# Patient Record
Sex: Male | Born: 1940 | Race: White | Hispanic: No | Marital: Married | Smoking: Current every day smoker
Health system: Southern US, Community
[De-identification: ages and names within clinical notes are randomized; demographics above are authoritative.]

## PROBLEM LIST (undated history)

## (undated) DIAGNOSIS — I2510 Atherosclerotic heart disease of native coronary artery without angina pectoris: Secondary | ICD-10-CM

## (undated) DIAGNOSIS — E119 Type 2 diabetes mellitus without complications: Secondary | ICD-10-CM

## (undated) DIAGNOSIS — I10 Essential (primary) hypertension: Secondary | ICD-10-CM

## (undated) DIAGNOSIS — I219 Acute myocardial infarction, unspecified: Secondary | ICD-10-CM

## (undated) HISTORY — PX: CARDIAC SURGERY: SHX584

## (undated) HISTORY — PX: TONSILLECTOMY: SUR1361

---

## 2017-05-15 ENCOUNTER — Ambulatory Visit (INDEPENDENT_AMBULATORY_CARE_PROVIDER_SITE_OTHER): Payer: Medicare HMO

## 2017-05-15 ENCOUNTER — Other Ambulatory Visit: Payer: Self-pay

## 2017-05-15 ENCOUNTER — Ambulatory Visit
Admission: EM | Admit: 2017-05-15 | Discharge: 2017-05-15 | Disposition: A | Discharge status: Home / Self Care | Payer: Medicare HMO | Attending: Family Medicine | Admitting: Family Medicine

## 2017-05-15 ENCOUNTER — Encounter: Payer: Self-pay | Admitting: Emergency Medicine

## 2017-05-15 DIAGNOSIS — J069 Acute upper respiratory infection, unspecified: Secondary | ICD-10-CM | POA: Diagnosis not present

## 2017-05-15 DIAGNOSIS — B028 Zoster with other complications: Secondary | ICD-10-CM

## 2017-05-15 DIAGNOSIS — R0781 Pleurodynia: Secondary | ICD-10-CM

## 2017-05-15 HISTORY — DX: Type 2 diabetes mellitus without complications: E11.9

## 2017-05-15 HISTORY — DX: Acute myocardial infarction, unspecified: I21.9

## 2017-05-15 HISTORY — DX: Essential (primary) hypertension: I10

## 2017-05-15 HISTORY — DX: Atherosclerotic heart disease of native coronary artery without angina pectoris: I25.10

## 2017-05-15 MED ORDER — TRAMADOL HCL 50 MG PO TABS: 50.0000 mg | ORAL_TABLET | Freq: Two times a day (BID) | ORAL | 0 refills | Status: DC | PRN

## 2017-05-15 MED ORDER — VALACYCLOVIR HCL 1 G PO TABS: 1000.0000 mg | ORAL_TABLET | Freq: Three times a day (TID) | ORAL | 0 refills | Status: AC

## 2017-05-15 NOTE — ED Provider Notes (Signed)
[facility] ____________________________________________  Time seen: Approximately 6:58 PM  I have reviewed the triage vital signs and the nursing notes.   HISTORY  Chief Complaint Cough and Nasal Congestion  HPI Peter Moore is a 77 y.o. male presenting for evaluation of right-sided rib pain that has been present up to 2-3 weeks.  States that he had right-sided rib pain for approximately 3 weeks, however approximately 6 days ago the pain improved slightly in a rash appeared in the same area.  Patient reports he does not like taking anything for pain, but reports he had some leftover 600 mg ibuprofens in which she has taken 2 yesterday and 2 today over the whole day, and states that it has helped some.  Denies taking any other over-the-counter medications for the same complaints.  Denies fall or direct trauma.  States right side rib pain does hurt worse with coughing or sneezing.  States that 2 days ago he was having a "coughing fit" and states he had some increased pain to his right ribs while coughing.  States for the last 3 days he has also had a runny nose, nasal congestion and cough.  States his wife sick with some similar the complaints.  Denies known fevers.  Denies sore throat.  States that he feels he has "just a cold ".  Patient states that he is here for the right-sided rib pain.  Patient reports that he did have chickenpox as a child.  Also reports that he did have the shingles vaccine.  Denies any other changes of contacts or known triggers.  Reports otherwise feels well.  Denies any accompanying chest pain, shortness of breath or hemoptysis.  Denies chest pain, shortness of breath, abdominal pain, extremity pain, extremity swelling or rash. Denies recent sickness. Denies recent antibiotic use.   [place]: PCP   Past Medical History:  Diagnosis Date  . Coronary artery disease   . Diabetes mellitus without complication (HCC)   . Hypertension   .  Myocardial infarction (HCC)     There are no active problems to display for this patient.   Past Surgical History:  Procedure Laterality Date  . CARDIAC SURGERY    . TONSILLECTOMY       No current facility-administered medications for this encounter.   Current Outpatient Medications:  .  aspirin EC 81 MG tablet, Take 81 mg by mouth daily., Disp: , Rfl:  .  losartan (COZAAR) 100 MG tablet, Take 100 mg by mouth daily., Disp: , Rfl:  .  metFORMIN (GLUCOPHAGE) 1000 MG tablet, Take 1,000 mg by mouth 2 (two) times daily with a meal., Disp: , Rfl:  .  traMADol (ULTRAM) 50 MG tablet, Take 1 tablet (50 mg total) by mouth every 12 (twelve) hours as needed for moderate pain (Do not drive or operate machinery while taking as can cause drowsiness.)., Disp: 10 tablet, Rfl: 0 .  valACYclovir (VALTREX) 1000 MG tablet, Take 1 tablet (1,000 mg total) by mouth 3 (three) times daily for 7 days., Disp: 21 tablet, Rfl: 0  Allergies Patient has no known allergies.  History reviewed. No pertinent family history.  Social History Social History   Tobacco Use  . Smoking status: Current Every Day Smoker    Types: Cigarettes  . Smokeless tobacco: Never Used  Substance Use Topics  . Alcohol use: No    Frequency: Never  . Drug use: Not on file    Review of Systems Constitutional: No fever/chills ENT: No sore throat.  Cardiovascular: Denies chest pain. Respiratory: Denies shortness of breath Musculoskeletal: Negative for back pain. Skin: As above. Neurological: Negative for headaches, focal weakness or numbness.   ____________________________________________   PHYSICAL EXAM:  VITAL SIGNS: ED Triage Vitals  Enc Vitals Group     BP 05/15/17 1807 (!) 159/81     Pulse Rate 05/15/17 1807 77     Resp 05/15/17 1807 19     Temp 05/15/17 1807 98.2 F (36.8 C)     Temp Source 05/15/17 1807 Oral     SpO2 05/15/17 1807 95 %     Weight 05/15/17 1803 215 lb (97.5 kg)     Height 05/15/17 1803 5\' 5"   (1.651 m)     Head Circumference --      Peak Flow --      Pain Score 05/15/17 1803 8     Pain Loc --      Pain Edu? --      Excl. in GC? --     Constitutional: Alert and oriented. Well appearing and in no acute distress. Eyes: Conjunctivae are normal.  Head: Atraumatic. No sinus tenderness to palpation. No swelling. No erythema.  Ears: no erythema, normal TMs bilaterally.   Nose:Nasal congestion  Mouth/Throat: Mucous membranes are moist. No pharyngeal erythema. No tonsillar swelling or exudate.  Neck: No stridor.  No cervical spine tenderness to palpation. Hematological/Lymphatic/Immunilogical: No cervical lymphadenopathy. Cardiovascular: Normal rate, regular rhythm. Grossly normal heart sounds.  Good peripheral circulation. Respiratory: Normal respiratory effort.  No retractions. No wheezes, rales or rhonchi. Good air movement.  Gastrointestinal: Soft and nontender. Obese abdomen. Musculoskeletal: Ambulatory with steady gait. No cervical, thoracic or lumbar tenderness to palpation. Neurologic:  Normal speech and language. No gait instability. Skin:  Skin appears warm, dry. Except: Patient with right anterior axillary to mid clavicular line beneath breast erythematous clustered vesicular rash that is mildly tender to direct palpation, no surrounding direct rib tenderness, no drainage, no other rash noted, no other tenderness noted. Psychiatric: Mood and affect are normal. Speech and behavior are normal.  ___________________________________________   LABS (all labs ordered are listed, but only abnormal results are displayed)  Labs Reviewed - No data to display  RADIOLOGY  Dg Ribs Unilateral W/chest Right  Result Date: 05/15/2017 CLINICAL DATA:  RIGHT anterolateral rib pain for 3-4 weeks, rash, question shingles, has been traveling, cough for 3 days, history hypertension, diabetes mellitus, coronary artery disease post MI EXAM: RIGHT RIBS AND CHEST - 3+ VIEW COMPARISON:  None  FINDINGS: Normal heart size and pulmonary vascularity. Tortuous and calcified thoracic aorta. Lungs clear. No infiltrate, pleural effusion or pneumothorax. BB placed at site of symptoms lower RIGHT chest. Bones demineralized. No definite rib fracture or bone destruction. IMPRESSION: No acute abnormalities. Electronically Signed   By: Ulyses Southward M.D.   On: 05/15/2017 19:25   ____________________________________________   PROCEDURES Procedures    INITIAL IMPRESSION / ASSESSMENT AND PLAN / ED COURSE  Pertinent labs & imaging results that were available during my care of the patient were reviewed by me and considered in my medical decision making (see chart for details).  Well-appearing patient.  No acute distress.  Presenting for evaluation of right rib pain as well as cough and congestion.  Right rib pain present prior to rash onset, rash clinical appearance consistent with shingles.  Will treat with Valtrex.  Patient declines need for pain medication will also evaluate rib x-ray.  Rib x-rays reviewed, and as per radiologist, no acute abnormalities.  Discussed  with patient.  Will start patient on oral Valtrex.  Patient initially declined prescription pain medication, but then requested to have prescription just in case if needed.  Rx tramadol as needed for breakthrough pain.  Patient also with viral upper respiratory infection, patient states he does not need medications for this, encourage supportive care.  Discussed strict follow-up and return parameters.  Follow-up with primary care as needed for continued pain or rash concerns.Discussed indication, risks and benefits of medications with patient.  Discussed follow up with Primary care physician this week. Discussed follow up and return parameters including no resolution or any worsening concerns. Patient verbalized understanding and agreed to plan.     [place] controlled substance database reviewed for last one year, and no recent  controlled medications found.  ____________________________________________   FINAL CLINICAL IMPRESSION(S) / ED DIAGNOSES  Final diagnoses:  Rib pain on right side  Herpes zoster with complication  Upper respiratory tract infection, unspecified type     ED Discharge Orders        Ordered    valACYclovir (VALTREX) 1000 MG tablet  3 times daily     05/15/17 1933    traMADol (ULTRAM) 50 MG tablet  Every 12 hours PRN     05/15/17 1939       Note: This dictation was prepared with Dragon dictation along with smaller phrase technology. Any transcriptional errors that result from this process are unintentional.         Renford DillsMiller, Tionna Gigante, NP 05/15/17 1953

## 2017-05-15 NOTE — ED Triage Notes (Signed)
Patient reports cough, congestion, and runny nose since Dec. 26  Patient reports right sided rib pain that has gotten worse since having his cold.  Patient denies fevers.  Patient denies chest pain and SOB.

## 2017-05-15 NOTE — Discharge Instructions (Signed)
Take medication as prescribed. Rest. Drink plenty of fluids. Monitor.  ° °Follow up with your primary care physician this week. Return to Urgent care for new or worsening concerns.  ° °

## 2018-12-02 ENCOUNTER — Other Ambulatory Visit: Payer: Self-pay | Admitting: Neurology

## 2018-12-02 DIAGNOSIS — I63412 Cerebral infarction due to embolism of left middle cerebral artery: Secondary | ICD-10-CM

## 2018-12-13 ENCOUNTER — Ambulatory Visit
Admission: RE | Admit: 2018-12-13 | Discharge: 2018-12-13 | Disposition: A | Discharge status: Home / Self Care | Payer: Medicare HMO | Source: Ambulatory Visit | Attending: Neurology | Admitting: Neurology

## 2018-12-13 ENCOUNTER — Other Ambulatory Visit: Payer: Self-pay

## 2018-12-13 DIAGNOSIS — I63412 Cerebral infarction due to embolism of left middle cerebral artery: Secondary | ICD-10-CM

## 2019-01-15 ENCOUNTER — Other Ambulatory Visit: Payer: Self-pay | Admitting: Neurology

## 2019-01-15 DIAGNOSIS — I639 Cerebral infarction, unspecified: Secondary | ICD-10-CM

## 2019-01-15 DIAGNOSIS — R4701 Aphasia: Secondary | ICD-10-CM

## 2019-01-23 ENCOUNTER — Ambulatory Visit
Admission: RE | Admit: 2019-01-23 | Discharge: 2019-01-23 | Disposition: A | Discharge status: Home / Self Care | Payer: Medicare HMO | Source: Ambulatory Visit | Attending: Neurology | Admitting: Neurology

## 2019-01-23 ENCOUNTER — Other Ambulatory Visit: Payer: Self-pay

## 2019-01-23 DIAGNOSIS — R4701 Aphasia: Secondary | ICD-10-CM | POA: Insufficient documentation

## 2019-01-23 DIAGNOSIS — I639 Cerebral infarction, unspecified: Secondary | ICD-10-CM | POA: Diagnosis not present

## 2019-03-19 ENCOUNTER — Other Ambulatory Visit: Payer: Self-pay

## 2019-03-19 ENCOUNTER — Encounter: Payer: Self-pay | Admitting: Speech Pathology

## 2019-03-19 ENCOUNTER — Ambulatory Visit: Payer: Medicare HMO | Attending: Neurology | Admitting: Speech Pathology

## 2019-03-19 DIAGNOSIS — R4701 Aphasia: Secondary | ICD-10-CM | POA: Insufficient documentation

## 2019-03-19 NOTE — Therapy (Signed)
[place] MAIN [facility] SERVICES [address] [place], [place], [address] Phone: [phone]   Fax:  [phone]  Speech Language Pathology Evaluation  Patient Details  Name: Peter Moore MRN: [id number] Date of Birth: [birth year] Referring Provider (SLP): Dr. Manuella Ghazi   Encounter Date: 03/19/2019  End of Session - 03/19/19 1531    Visit Number  1    Number of Visits  25    Date for SLP Re-Evaluation  06/11/19    Authorization Type  Medicare    Authorization Time Period  start 03/19/2019    Authorization - Visit Number  1    Authorization - Number of Visits  10    SLP Start Time  1300    SLP Stop Time   1400    SLP Time Calculation (min)  60 min    Activity Tolerance  Patient tolerated treatment well       Past Medical History:  Diagnosis Date  . Coronary artery disease   . Diabetes mellitus without complication ([place])   . Hypertension   . Myocardial infarction [facility])     Past Surgical History:  Procedure Laterality Date  . CARDIAC SURGERY    . TONSILLECTOMY      There were no vitals filed for this visit.      SLP Evaluation [facility] - 03/19/19 0001      SLP Visit Information   SLP Received On  03/19/19    Referring Provider (SLP)  Dr. 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  He received inpatient rehab at [facility], several outpatient sessions at [facility], and home health therapy (not clear when this ended).      Prior Functional Status   Cognitive/Linguistic Baseline  Within functional limits      Auditory Comprehension   Overall Auditory Comprehension  Impaired      Reading Comprehension   Reading Status  Impaired      Expression   Primary Mode of Expression  Verbal      Verbal  Expression   Overall Verbal Expression  Impaired      Oral Motor/Sensory Function   Overall Oral Motor/Sensory Function  Appears within functional limits for tasks assessed      Motor Speech   Overall Motor Speech  Appears within functional limits for tasks assessed      Standardized Assessments   Standardized Assessments   Western Aphasia Battery revised         Western Aphasia Battery- Revised  Spontaneous Speech      Information content  5/10       Fluency   6/10     Comprehension     Yes/No questions  54/60        Auditory Word Recognition 44/60        Sequential Commands 16/80    Repetition   76/100     Naming    Object Naming  48/60        Word Fluency   3/20        Sentence Completion 8/10        Responsive Speech   7/10  Aphasia Quotient  61.8/100    SLP Education - 03/19/19 1530    Education Details  Results and recommendations    Person(s) Educated  Patient;Spouse    Methods  Explanation    Comprehension  Verbalized understanding         SLP Long Term Goals - 03/19/19 1535      SLP LONG TERM GOAL #1   Title  Patient will complete semantic feature word finding tasks with 80% accuracy.    Time  12    Period  Weeks    Status  New    Target Date  06/11/19      SLP LONG TERM GOAL #2   Title  Patient will generate grammatical and cogent sentence to complete simple/concrete linguistic task with 80% accuracy.    Time  12    Period  Weeks    Status  New    Target Date  06/11/19      SLP LONG TERM GOAL #3   Title  Patient will complete 2 unit processing tasks with 80% accuracy without the need of repetition of task instructions or significant delays in responding.    Time  12    Period  Weeks    Status  New    Target Date  06/11/19      SLP LONG TERM GOAL #4   Title  Patient will demonstrate reading comprehension for sentences with 80% accuracy.    Time  12    Period  Weeks    Status  New    Target Date  06/11/19       Plan - 03/19/19 1533     Clinical Impression Statement  At 7 months post onset left MCA CVA, the patient is presenting with moderate Wernicke's Aphasia characterized by relatively fluent speech with poor information content, impaired auditory comprehension, impaired repetition, and impaired naming.  The patient would benefit from skilled speech therapy for restorative and compensatory treatment of aphasia.    Speech Therapy Frequency  2x / week    Duration  Other (comment)   12 weeks   Treatment/Interventions  Multimodal communcation approach;Language facilitation;SLP instruction and feedback;Patient/family education    Potential to Achieve Goals  Good    Potential Considerations  Ability to learn/carryover information;Pain level;Family/community support;Co-morbidities;Previous level of function;Cooperation/participation level;Severity of impairments    Consulted and Agree with Plan of Care  Patient;Family member/caregiver       Patient will benefit from skilled therapeutic intervention in order to improve the following deficits and impairments:   Aphasia - Plan: SLP plan of care cert/re-cert    Problem List There are no active problems to display for this patient.   Dollene Primrose, MS/CCC- SLP  Peter Moore 03/19/2019, 3:41 PM  [place] [facility] MAIN [facility] SERVICES [address] [place], [place], [address] Phone: [phone]   Fax:  [phone]  Name: Peter Moore MRN: [id number] Date of Birth: [birth year]

## 2019-03-23 ENCOUNTER — Ambulatory Visit: Payer: Medicare HMO | Admitting: Speech Pathology

## 2019-03-23 ENCOUNTER — Other Ambulatory Visit: Payer: Self-pay

## 2019-03-23 ENCOUNTER — Encounter: Payer: Self-pay | Admitting: Speech Pathology

## 2019-03-23 DIAGNOSIS — R4701 Aphasia: Secondary | ICD-10-CM

## 2019-03-23 NOTE — Therapy (Signed)
[place] MAIN [facility] SERVICES [address] [place], [place], [address] Phone: [phone]   Fax:  [phone]  Speech Language Pathology Treatment  Patient Details  Name: Peter Moore MRN: [id number] Date of Birth: 04-Dec-1940 Referring Provider (SLP): Dr. Manuella Ghazi   Encounter Date: 03/23/2019  End of Session - 03/23/19 1249    Visit Number  2    Number of Visits  25    Date for SLP Re-Evaluation  06/11/19    Authorization Type  Medicare    Authorization Time Period  start 03/19/2019    Authorization - Visit Number  2    Authorization - Number of Visits  10    SLP Start Time  1000    SLP Stop Time   1050    SLP Time Calculation (min)  50 min    Activity Tolerance  Patient tolerated treatment well       Past Medical History:  Diagnosis Date  . Coronary artery disease   . Diabetes mellitus without complication ([place])   . Hypertension   . Myocardial infarction [facility])     Past Surgical History:  Procedure Laterality Date  . CARDIAC SURGERY    . TONSILLECTOMY      There were no vitals filed for this visit.  Subjective Assessment - 03/23/19 1247    Subjective  The patient was alert and cooperative throughout the therapy session. He shared information about his children and grandchildren.            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  He received inpatient rehab at [facility], several outpatient sessions at [facility], and home health therapy (not clear when this ended).       Treatment Provided   Treatment provided  Cognitive-Linquistic      Pain Assessment   Pain Assessment  No/denies pain      Cognitive-Linquistic Treatment   Treatment focused on  Aphasia    Skilled Treatment  Patient completed a word finding task with 20% accuracy independently. Given semantic/phonemic cueing, accuracy increased to  100%. Patient generated grammatical and cogent sentences to complete a simple/concrete linguistic task with 50% accuracy without interventions. Given moderate cueing to complete his thoughts and to repair breakdowns, accuracy improved to 65%. Patient answered personal 2 unit yes/no questions with 100% accuracy independently without needing repetition of task instructions or significant delays in responding. Patient followed 2 unit body part commands with 80% accuracy independently without needing repetition of task instructions or significant delays in responding. Given repetition of instructions and longer response time, accuracy improved to 95%. Patient followed 2 unit commands with pictures with 60% accuracy independently without needing repetition of task instructions or significant delays in responding. Given repetition of instructions, longer response time, and moderate verbal cueing, accuracy increased to 95%. Patient demonstrated reading comprehension for sentences with 60% accuracy without interventions. Given moderate cueing for attention to key information, accuracy increased to 85%. Patient noted with perseveration across all therapy tasks.      Assessment / Recommendations / Plan   Plan  Continue with current plan of care      Progression Toward Goals   Progression toward goals  Progressing toward goals       SLP Education - 03/23/19 1248    Education Details  When you get lost, take a pause,  come back to the task, and try again.    Person(s) Educated  Patient    Methods  Explanation    Comprehension  Verbalized understanding;Returned demonstration         SLP Long Term Goals - 03/19/19 1535      SLP LONG TERM GOAL #1   Title  Patient will complete semantic feature word finding tasks with 80% accuracy.    Time  12    Period  Weeks    Status  New    Target Date  06/11/19      SLP LONG TERM GOAL #2   Title  Patient will generate grammatical and cogent sentence to complete  simple/concrete linguistic task with 80% accuracy.    Time  12    Period  Weeks    Status  New    Target Date  06/11/19      SLP LONG TERM GOAL #3   Title  Patient will complete 2 unit processing tasks with 80% accuracy without the need of repetition of task instructions or significant delays in responding.    Time  12    Period  Weeks    Status  New    Target Date  06/11/19      SLP LONG TERM GOAL #4   Title  Patient will demonstrate reading comprehension for sentences with 80% accuracy.    Time  12    Period  Weeks    Status  New    Target Date  06/11/19       Plan - 03/23/19 1250    Clinical Impression Statement  Patient presents with moderate Wernicke's Aphasia characterized by relatively fluent speech with poor information content, impaired auditory comprehension, reduced repetition abilities, and impaired naming. He is frustrated by his communication challenges and gives up easily when he experiences difficulty expressing himself. Patient is stimulable for word finding given semantic/phonemic cueing. Patient is responsive to corrective feedback and verbal cueing to "pause, come back, and try again" during auditory comprehension tasks. Patient will continue with skilled speech therapy for restorative and compensatory treatment of aphasia.    Speech Therapy Frequency  2x / week    Duration  Other (comment)    Treatment/Interventions  Multimodal communcation approach;Language facilitation;SLP instruction and feedback;Patient/family education;Cueing hierarchy;Compensatory techniques    Potential to Achieve Goals  Good    Potential Considerations  Ability to learn/carryover information;Pain level;Family/community support;Co-morbidities;Previous level of function;Cooperation/participation level;Severity of impairments       Patient will benefit from skilled therapeutic intervention in order to improve the following deficits and impairments:   Aphasia    Problem List There are no  active problems to display for this patient.  Yacob Wilkerson A. Avelina Laine., Graduate Clinician Haskel Khan 03/23/2019, 12:51 PM  [place] [facility] MAIN [facility] SERVICES [address] [place], [place], [address] Phone: [phone]   Fax:  [phone]   Name: Eustace Hur MRN: [id number] Date of Birth: 05/22/1940

## 2019-03-25 ENCOUNTER — Other Ambulatory Visit: Payer: Self-pay

## 2019-03-25 ENCOUNTER — Encounter: Payer: Self-pay | Admitting: Speech Pathology

## 2019-03-25 ENCOUNTER — Ambulatory Visit: Payer: Medicare HMO | Admitting: Speech Pathology

## 2019-03-25 DIAGNOSIS — R4701 Aphasia: Secondary | ICD-10-CM | POA: Diagnosis not present

## 2019-03-25 NOTE — Therapy (Signed)
[place] [facility] MAIN [facility] SERVICES [address] [place], [place], [address] Phone: [phone]   Fax:  [phone]  Speech Language Pathology Treatment  Patient Details  Name: Peter Moore MRN: [id number] Date of Birth: Sep 22, 1940 Referring Provider (SLP): Dr. Sherryll Burger   Encounter Date: 03/25/2019  End of Session - 03/25/19 1306    Visit Number  3    Number of Visits  25    Date for SLP Re-Evaluation  06/11/19    Authorization Type  Medicare    Authorization Time Period  start 03/19/2019    Authorization - Visit Number  3    Authorization - Number of Visits  10    SLP Start Time  1000    SLP Stop Time   1050    SLP Time Calculation (min)  50 min    Activity Tolerance  Patient tolerated treatment well       Past Medical History:  Diagnosis Date  . Coronary artery disease   . Diabetes mellitus without complication (HCC)   . Hypertension   . Myocardial infarction [facility])     Past Surgical History:  Procedure Laterality Date  . CARDIAC SURGERY    . TONSILLECTOMY      There were no vitals filed for this visit.  Subjective Assessment - 03/25/19 1305    Subjective  The patient was alert and cooperative throughout the therapy session. He shared that [birth year] was [birth year] birthday.            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  He received inpatient rehab at [facility], several outpatient sessions at [facility], and home health therapy (not clear when this ended).       Treatment Provided   Treatment provided  Cognitive-Linquistic      Pain Assessment   Pain Assessment  No/denies pain      Cognitive-Linquistic Treatment   Treatment focused on  Aphasia    Skilled Treatment  Patient named at least 2 members of concrete categories with 20% accuracy independently. Given semantic/phonemic cueing, accuracy increased  to 100%. Patient generated grammatical and cogent sentences to complete a simple/concrete linguistic task with 65% accuracy without interventions. Given moderate cueing to complete his thoughts and to repair communication breakdowns, accuracy improved to 75%. Patient answered personal 2 unit yes/no questions with 100% accuracy independently without needing repetition of task instructions or significant delays in responding. Patient followed 2 unit body part commands with 70% accuracy independently without needing repetition of task instructions or significant delays in responding. Given repetition of instructions and longer response time, accuracy improved to 100%. Patient followed 2 unit commands with pictures with 70% accuracy independently without needing repetition of task instructions or significant delays in responding. Given repetition of instructions, longer response time, and moderate verbal cueing, accuracy increased to 100%. Patient named common objects from drawings with 80% accuracy independently. Given minimal semantic cueing, accuracy improved to 100%. Patient demonstrated reading comprehension for sentences with 75% accuracy without interventions. Given moderate cueing for attention to key information, accuracy increased to 100%. Patient noted with perseveration across all therapy tasks.      Assessment / Recommendations / Plan   Plan  Continue with current plan of care      Progression Toward Goals   Progression toward goals  Progressing toward goals  SLP Education - 03/25/19 1305    Education Details  Your anger and frustration are your greatest enemies when it comes to dealing with your language challenges.    Person(s) Educated  Patient    Methods  Explanation    Comprehension  Verbalized understanding         SLP Long Term Goals - 03/19/19 1535      SLP LONG TERM GOAL #1   Title  Patient will complete semantic feature word finding tasks with 80% accuracy.    Time  12     Period  Weeks    Status  New    Target Date  06/11/19      SLP LONG TERM GOAL #2   Title  Patient will generate grammatical and cogent sentence to complete simple/concrete linguistic task with 80% accuracy.    Time  12    Period  Weeks    Status  New    Target Date  06/11/19      SLP LONG TERM GOAL #3   Title  Patient will complete 2 unit processing tasks with 80% accuracy without the need of repetition of task instructions or significant delays in responding.    Time  12    Period  Weeks    Status  New    Target Date  06/11/19      SLP LONG TERM GOAL #4   Title  Patient will demonstrate reading comprehension for sentences with 80% accuracy.    Time  12    Period  Weeks    Status  New    Target Date  06/11/19       Plan - 03/25/19 1307    Clinical Impression Statement  Patient presents with moderate Wernicke's Aphasia characterized by relatively fluent speech with poor information content, impaired auditory comprehension, reduced repetition abilities, and impaired naming. He is frustrated and angry about his communication challenges and gives up easily when he experiences difficulty expressing himself. Patient is stimulable for word finding given minimal-moderate semantic/phonemic cueing. Patient is responsive to corrective feedback and verbal cueing to "close your eyes, take a breath, and try again" when he becomes frustrated during auditory comprehension tasks. Patient will continue with skilled speech therapy for restorative and compensatory treatment of aphasia.    Speech Therapy Frequency  2x / week    Duration  Other (comment)    Treatment/Interventions  Multimodal communcation approach;Language facilitation;SLP instruction and feedback;Patient/family education;Cueing hierarchy;Compensatory techniques    Potential to Achieve Goals  Good    Potential Considerations  Ability to learn/carryover information;Pain level;Family/community support;Co-morbidities;Previous level of  function;Cooperation/participation level;Severity of impairments    SLP Home Exercise Plan  Provided    Consulted and Agree with Plan of Care  Patient       Patient will benefit from skilled therapeutic intervention in order to improve the following deficits and impairments:   Aphasia    Problem List There are no active problems to display for this patient.  Saisha Hogue A. Francis Dowse., Graduate Clinician Vella Kohler 03/25/2019, 1:07 PM  [place] MAIN [facility] SERVICES [address] [place], [place], [address] Phone: [phone]   Fax:  [phone]   Name: Peter Moore MRN: [id number] Date of Birth: 1940-07-25

## 2019-03-31 ENCOUNTER — Ambulatory Visit: Payer: Medicare HMO | Admitting: Speech Pathology

## 2019-03-31 ENCOUNTER — Encounter: Payer: Self-pay | Admitting: Speech Pathology

## 2019-03-31 ENCOUNTER — Other Ambulatory Visit: Payer: Self-pay

## 2019-03-31 DIAGNOSIS — R4701 Aphasia: Secondary | ICD-10-CM | POA: Diagnosis not present

## 2019-03-31 NOTE — Therapy (Signed)
[place] MAIN [facility] SERVICES [address] [place], [place], [address] Phone: [phone]   Fax:  [phone]  Speech Language Pathology Treatment  Patient Details  Name: Peter Moore MRN: [id number] Date of Birth: Aug 19, 1940 Referring Provider (SLP): Dr. Manuella Ghazi   Encounter Date: 03/31/2019  End of Session - 03/31/19 1228    Visit Number  4    Number of Visits  25    Date for SLP Re-Evaluation  06/11/19    Authorization Type  Medicare    Authorization Time Period  start 03/19/2019    Authorization - Visit Number  4    Authorization - Number of Visits  10    SLP Start Time  0900    SLP Stop Time   0950    SLP Time Calculation (min)  50 min    Activity Tolerance  Patient tolerated treatment well       Past Medical History:  Diagnosis Date  . Coronary artery disease   . Diabetes mellitus without complication ([place])   . Hypertension   . Myocardial infarction [facility])     Past Surgical History:  Procedure Laterality Date  . CARDIAC SURGERY    . TONSILLECTOMY      There were no vitals filed for this visit.  Subjective Assessment - 03/31/19 1227    Subjective  The patient was alert and cooperative throughout the therapy session. He was accompanied by his wife, Peter Moore, today.    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  He received inpatient rehab at [facility], several outpatient sessions at [facility], and home health therapy (not clear when this ended).       Treatment Provided   Treatment provided  Cognitive-Linquistic      Pain Assessment   Pain Assessment  No/denies pain      Cognitive-Linquistic Treatment   Treatment focused on  Aphasia    Skilled Treatment  Patient identified objects from definitions presented verbally with 58% accuracy  independently. Given semantic/phonemic cueing, accuracy increased to 100%. Patient generated grammatical and cogent sentences to complete a simple/concrete linguistic task with 60% accuracy without interventions. Given moderate cueing to complete his thoughts and to repair communication breakdowns, accuracy improved to 75%. Patient followed 2 unit body part commands with 85% accuracy independently without needing repetition of task instructions or significant delays in responding. Given repetition of instructions and longer response time, accuracy improved to 100%. Patient followed 2 unit commands with pictures with 60% accuracy independently without needing repetition of task instructions or significant delays in responding. Given corrective feedback, repetition of instructions, and longer response time, accuracy increased to 90%. Patient named common objects from drawings with 80% accuracy independently. Given minimal semantic cueing, accuracy improved to 100%. Patient demonstrated reading comprehension for sentences with 75% accuracy without interventions. Given moderate cueing for attention to key information, accuracy increased to 100%. Patient noted with perseveration across all therapy tasks. Caregiver education was provided verbally related to compensatory strategies to which the patient has exhibited responsiveness within the therapy context. Patient's wife verbalized understanding and expressed intent to facilitate generalization of compensatory strategy use to the home environment.      Assessment / Recommendations / Plan   Plan  Continue with  current plan of care      Progression Toward Goals   Progression toward goals  Progressing toward goals       SLP Education - 03/31/19 1227    Education Details  Refer back to the instructions when you forget what you were supposed to do.    Person(s) Educated  Patient    Methods  Explanation;Demonstration    Comprehension  Verbalized  understanding;Returned demonstration         SLP Long Term Goals - 03/19/19 1535      SLP LONG TERM GOAL #1   Title  Patient will complete semantic feature word finding tasks with 80% accuracy.    Time  12    Period  Weeks    Status  New    Target Date  06/11/19      SLP LONG TERM GOAL #2   Title  Patient will generate grammatical and cogent sentence to complete simple/concrete linguistic task with 80% accuracy.    Time  12    Period  Weeks    Status  New    Target Date  06/11/19      SLP LONG TERM GOAL #3   Title  Patient will complete 2 unit processing tasks with 80% accuracy without the need of repetition of task instructions or significant delays in responding.    Time  12    Period  Weeks    Status  New    Target Date  06/11/19      SLP LONG TERM GOAL #4   Title  Patient will demonstrate reading comprehension for sentences with 80% accuracy.    Time  12    Period  Weeks    Status  New    Target Date  06/11/19       Plan - 03/31/19 1228    Clinical Impression Statement  Patient presents with moderate Wernicke's Aphasia characterized by relatively fluent speech with poor information content, impaired auditory comprehension, reduced repetition abilities, and impaired naming. He is frustrated and angry about his communication challenges and tends to give up easily when he experiences difficulty expressing himself. Patient is stimulable for word finding given minimal-moderate semantic/phonemic cueing. Patient is increasingly responsive to corrective feedback and verbal cueing for implementation of compensatory strategies during auditory comprehension tasks in the therapy setting. Patient will continue with skilled speech therapy for restorative and compensatory treatment of aphasia.    Speech Therapy Frequency  2x / week    Duration  Other (comment)    Treatment/Interventions  Multimodal communcation approach;Language facilitation;SLP instruction and feedback;Patient/family  education;Cueing hierarchy;Compensatory techniques    Potential to Achieve Goals  Good    Potential Considerations  Ability to learn/carryover information;Pain level;Family/community support;Co-morbidities;Previous level of function;Cooperation/participation level;Severity of impairments    SLP Home Exercise Plan  Provided    Consulted and Agree with Plan of Care  Patient;Family member/caregiver    Family Member Consulted  Wife Peter Moore)       Patient will benefit from skilled therapeutic intervention in order to improve the following deficits and impairments:   Aphasia    Problem List There are no active problems to display for this patient.  Peter Wanat A. Avelina Moore., Graduate Clinician Peter Moore 03/31/2019, 12:29 PM  [place] [facility] MAIN [facility] SERVICES [address] [place], [place], [address] Phone: [phone]   Fax:  [phone]   Name: Peter Moore MRN: [id number] Date of Birth: 05-07-1941

## 2019-04-02 ENCOUNTER — Encounter: Payer: Self-pay | Admitting: Speech Pathology

## 2019-04-02 ENCOUNTER — Ambulatory Visit: Payer: Medicare HMO | Admitting: Speech Pathology

## 2019-04-02 ENCOUNTER — Other Ambulatory Visit: Payer: Self-pay

## 2019-04-02 DIAGNOSIS — R4701 Aphasia: Secondary | ICD-10-CM

## 2019-04-02 NOTE — Therapy (Signed)
[place] MAIN [facility] SERVICES [address] [place], [place], [address] Phone: [phone]   Fax:  [phone]  Speech Language Pathology Treatment  Patient Details  Name: Peter Moore MRN: [id number] Date of Birth: [birth year] Referring Provider (SLP): Dr. Manuella Ghazi   Encounter Date: 04/02/2019  End of Session - 04/02/19 1053    Visit Number  5    Number of Visits  25    Date for SLP Re-Evaluation  06/11/19    Authorization Type  Medicare    Authorization Time Period  start 03/19/2019    Authorization - Visit Number  5    Authorization - Number of Visits  10    SLP Start Time  0900    SLP Stop Time   0950    SLP Time Calculation (min)  50 min    Activity Tolerance  Patient tolerated treatment well       Past Medical History:  Diagnosis Date  . Coronary artery disease   . Diabetes mellitus without complication ([place])   . Hypertension   . Myocardial infarction [facility])     Past Surgical History:  Procedure Laterality Date  . CARDIAC SURGERY    . TONSILLECTOMY      There were no vitals filed for this visit.  Subjective Assessment - 04/02/19 1052    Subjective  The patient was alert and cooperative throughout the therapy session. He shared information about two of his grandchildren.            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  He received inpatient rehab at [facility], several outpatient sessions at [facility] and home health therapy (not clear when this ended).       Treatment Provided   Treatment provided  Cognitive-Linquistic      Pain Assessment   Pain Assessment  No/denies pain      Cognitive-Linquistic Treatment   Treatment focused on  Aphasia    Skilled Treatment  Patient completed semantic feature analysis task with 60% accuracy independently. Given semantic/phonemic cueing, accuracy  increased to 90%. Patient generated grammatical and cogent sentences to complete a simple/concrete linguistic task with 40% accuracy without interventions. Given moderate cueing to complete his thoughts and to repair communication breakdowns, accuracy improved to 60%. Patient followed 2 unit body part commands with 90% accuracy independently without needing repetition of task instructions or significant delays in responding. Given repetition of instructions and longer response time, accuracy improved to 95%. Patient followed 2 unit commands with pictures with 72% accuracy independently without needing repetition of task instructions or significant delays in responding. Given corrective feedback, repetition of instructions, and longer response time, accuracy increased to 94%. Patient named common objects from drawings with 90% accuracy independently. Given minimal semantic cueing, accuracy improved to 100%. Patient demonstrated reading comprehension for sentences with 63% accuracy without interventions. Given moderate cueing for attention to key information, accuracy increased to 94%. Patient noted with perseveration across all therapy tasks.       Assessment / Recommendations / Plan   Plan  Continue with current plan of care      Progression Toward Goals   Progression toward goals  Progressing toward goals       SLP Education - 04/02/19 1052    Education Details  Take time to gather your thoughts, then try again.  Person(s) Educated  Patient    Methods  Explanation    Comprehension  Verbalized understanding;Verbal cues required         SLP Long Term Goals - 03/19/19 1535      SLP LONG TERM GOAL #1   Title  Patient will complete semantic feature word finding tasks with 80% accuracy.    Time  12    Period  Weeks    Status  New    Target Date  06/11/19      SLP LONG TERM GOAL #2   Title  Patient will generate grammatical and cogent sentence to complete simple/concrete linguistic task  with 80% accuracy.    Time  12    Period  Weeks    Status  New    Target Date  06/11/19      SLP LONG TERM GOAL #3   Title  Patient will complete 2 unit processing tasks with 80% accuracy without the need of repetition of task instructions or significant delays in responding.    Time  12    Period  Weeks    Status  New    Target Date  06/11/19      SLP LONG TERM GOAL #4   Title  Patient will demonstrate reading comprehension for sentences with 80% accuracy.    Time  12    Period  Weeks    Status  New    Target Date  06/11/19       Plan - 04/02/19 1053    Clinical Impression Statement  Patient presents with moderate Wernicke's Aphasia characterized by relatively fluent speech with poor information content, impaired auditory comprehension, reduced repetition abilities, and impaired naming. He is frustrated and angry about his communication challenges and tends to give up easily when he experiences difficulty expressing himself. Patient is stimulable for word finding given minimal-moderate semantic/phonemic cueing. Patient is increasingly responsive to corrective feedback and verbal cueing for implementation of compensatory strategies during auditory comprehension tasks in the therapy setting. Patient is reliant on cueing to complete his thoughts during moments of word retrieval difficulty. Will continue with skilled speech therapy for restorative and compensatory treatment of aphasia.    Speech Therapy Frequency  2x / week    Duration  Other (comment)    Treatment/Interventions  Multimodal communcation approach;Language facilitation;SLP instruction and feedback;Patient/family education;Cueing hierarchy;Compensatory techniques    Potential to Achieve Goals  Good    Potential Considerations  Ability to learn/carryover information;Pain level;Family/community support;Co-morbidities;Previous level of function;Cooperation/participation level;Severity of impairments    SLP Home Exercise Plan   Provided    Consulted and Agree with Plan of Care  Patient       Patient will benefit from skilled therapeutic intervention in order to improve the following deficits and impairments:   Aphasia    Problem List There are no active problems to display for this patient.  Lindy Pennisi A. Avelina Laine., Graduate Clinician Haskel Khan 04/02/2019, 10:54 AM  [place] [facility] MAIN [facility] SERVICES [address] [place], [place], [address] Phone: [phone]   Fax:  [phone]   Name: Marl Seago MRN: [id number] Date of Birth: 1940/06/08

## 2019-04-06 ENCOUNTER — Ambulatory Visit: Payer: Medicare HMO | Admitting: Speech Pathology

## 2019-04-06 ENCOUNTER — Encounter: Payer: Self-pay | Admitting: Speech Pathology

## 2019-04-06 ENCOUNTER — Other Ambulatory Visit: Payer: Self-pay

## 2019-04-06 DIAGNOSIS — R4701 Aphasia: Secondary | ICD-10-CM

## 2019-04-06 NOTE — Therapy (Signed)
[place] [facility] MAIN [facility] SERVICES [address] [place], [place], [address] Phone: [phone]   Fax:  [phone]  Speech Language Pathology Treatment  Patient Details  Name: Peter Moore MRN: [id number] Date of Birth: 1940-06-24 Referring Provider (SLP): Dr. Sherryll Burger   Encounter Date: 04/06/2019  End of Session - 04/06/19 1233    Visit Number  6    Number of Visits  25    Date for SLP Re-Evaluation  06/11/19    Authorization Type  Medicare    Authorization Time Period  start 03/19/2019    Authorization - Visit Number  6    Authorization - Number of Visits  10    SLP Start Time  1100    SLP Stop Time   1150    SLP Time Calculation (min)  50 min    Activity Tolerance  Patient tolerated treatment well       Past Medical History:  Diagnosis Date  . Coronary artery disease   . Diabetes mellitus without complication (HCC)   . Hypertension   . Myocardial infarction [facility])     Past Surgical History:  Procedure Laterality Date  . CARDIAC SURGERY    . TONSILLECTOMY      There were no vitals filed for this visit.  Subjective Assessment - 04/06/19 1231    Subjective  The patient was alert and cooperative throughout the therapy session. His wife, Bonita Quin, accompanied him for today's session.    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  He received inpatient rehab at [facility], several outpatient sessions at [facility], and home health therapy (not clear when this ended).       Treatment Provided   Treatment provided  Cognitive-Linquistic      Pain Assessment   Pain Assessment  No/denies pain      Cognitive-Linquistic Treatment   Treatment focused on  Aphasia    Skilled Treatment  Patient completed semantic feature analysis task with 50% accuracy independently.  Given semantic/phonemic cueing, accuracy increased to 77%. Patient generated grammatical and cogent sentences to complete a simple/concrete linguistic task with 45% accuracy without interventions. Given moderate cueing to complete his thoughts and to repair communication breakdowns, accuracy improved to 65%. Patient answered 2 unit yes/no questions with 84% accuracy independently without needing repetition of task instructions or significant delays in responding. Given corrective feedback, accuracy improved to 100%. Patient followed 2 unit commands with pictures with 80% accuracy independently without needing repetition of task instructions or significant delays in responding. Given corrective feedback, repetition of instructions, and longer response time, accuracy increased to 100%. Patient named common objects from drawings with 80% accuracy independently. Given minimal semantic cueing, accuracy improved to 100%. Patient demonstrated reading comprehension for sentences with 63% accuracy without interventions. Given moderate cueing for attention to key information, accuracy increased to 100%. Patient noted with perseveration across all therapy tasks.       Assessment / Recommendations / Plan   Plan  Continue with current plan of care      Progression Toward Goals   Progression toward goals  Progressing toward goals       SLP Education - 04/06/19 1231    Education Details  Review of semantic feature analysis and  application to patient's communication in the home environment.    Person(s) Educated  Patient;Spouse    Methods  Explanation    Comprehension  Verbalized understanding         SLP Long Term Goals - 03/19/19 1535      SLP LONG TERM GOAL #1   Title  Patient will complete semantic feature word finding tasks with 80% accuracy.    Time  12    Period  Weeks    Status  New    Target Date  06/11/19      SLP LONG TERM GOAL #2   Title  Patient will generate grammatical and cogent  sentence to complete simple/concrete linguistic task with 80% accuracy.    Time  12    Period  Weeks    Status  New    Target Date  06/11/19      SLP LONG TERM GOAL #3   Title  Patient will complete 2 unit processing tasks with 80% accuracy without the need of repetition of task instructions or significant delays in responding.    Time  12    Period  Weeks    Status  New    Target Date  06/11/19      SLP LONG TERM GOAL #4   Title  Patient will demonstrate reading comprehension for sentences with 80% accuracy.    Time  12    Period  Weeks    Status  New    Target Date  06/11/19       Plan - 04/06/19 1233    Clinical Impression Statement  Patient presents with moderate Wernicke's Aphasia characterized by relatively fluent speech with poor information content, impaired auditory comprehension, reduced repetition abilities, and perseveration in conversation. Patient demonstrates progress with managing his anger and frustration surrounding his communication challenges, though he remains reliant on cueing to complete his thoughts during moments of word retrieval difficulty. Patient is stimulable for word finding given minimal-moderate semantic/phonemic cueing. Patient is increasingly responsive to corrective feedback and verbal cueing for implementation of compensatory strategies during auditory comprehension tasks in the therapy setting. Will continue with skilled speech therapy for restorative and compensatory treatment of aphasia.    Speech Therapy Frequency  2x / week    Duration  Other (comment)    Treatment/Interventions  Multimodal communcation approach;Language facilitation;SLP instruction and feedback;Patient/family education;Cueing hierarchy;Compensatory techniques    Potential to Achieve Goals  Good    Potential Considerations  Ability to learn/carryover information;Pain level;Family/community support;Co-morbidities;Previous level of function;Cooperation/participation level;Severity  of impairments    SLP Home Exercise Plan  Provided    Consulted and Agree with Plan of Care  Patient;Family member/caregiver    Family Member Consulted  [name])       Patient will benefit from skilled therapeutic intervention in order to improve the following deficits and impairments:   Aphasia    Problem List There are no active problems to display for this patient.  Revella Shelton A. Francis Dowse., Graduate Clinician Vella Kohler 04/06/2019, 12:34 PM  [place] MAIN [facility] SERVICES [address] [place], [place], [address] Phone: [phone]   Fax:  [phone]   Name: Peter Moore MRN: [id number] Date of Birth: Dec 04, 1940

## 2019-04-08 ENCOUNTER — Encounter: Payer: Self-pay | Admitting: Speech Pathology

## 2019-04-08 ENCOUNTER — Ambulatory Visit: Payer: Medicare HMO | Admitting: Speech Pathology

## 2019-04-08 ENCOUNTER — Other Ambulatory Visit: Payer: Self-pay

## 2019-04-08 DIAGNOSIS — R4701 Aphasia: Secondary | ICD-10-CM | POA: Diagnosis not present

## 2019-04-08 NOTE — Therapy (Signed)
[facility] MAIN [facility] SERVICES [address] [place], [place], [address] Phone: [phone]   Fax:  [phone]  Speech Language Pathology Treatment  Patient Details  Name: Peter Moore MRN: [id number] Date of Birth: November 08, 1940 Referring Provider (SLP): Dr. Sherryll Burger   Encounter Date: 04/08/2019  End of Session - 04/08/19 1355    Visit Number  7    Number of Visits  25    Date for SLP Re-Evaluation  06/11/19    Authorization Type  Medicare    Authorization Time Period  start 03/19/2019    Authorization - Visit Number  7    Authorization - Number of Visits  10    SLP Start Time  1100    SLP Stop Time   1155    SLP Time Calculation (min)  55 min    Activity Tolerance  Patient tolerated treatment well       Past Medical History:  Diagnosis Date  . Coronary artery disease   . Diabetes mellitus without complication (HCC)   . Hypertension   . Myocardial infarction [facility])     Past Surgical History:  Procedure Laterality Date  . CARDIAC SURGERY    . TONSILLECTOMY      There were no vitals filed for this visit.  Subjective Assessment - 04/08/19 1354    Subjective  The patient was alert and cooperative throughout the therapy session. His wife, Peter Moore, accompanied him for today's session.    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  He received inpatient rehab at [facility], several outpatient sessions at [facility], and home health therapy (not clear when this ended).       Treatment Provided   Treatment provided  Cognitive-Linquistic      Pain Assessment   Pain Assessment  No/denies pain      Cognitive-Linquistic Treatment   Treatment focused on  Aphasia    Skilled Treatment  AUDITORY COMPREHENSION: Identify stated word (minimal pairs) with 80% accuracy.   Follow 1-step commands with objects with 35% accuracy independently and 75% given min cues (repetition and emphasis).  READING:  Follow written directions given mod cues to interpret word meaning.  VERBAL EXPRESSION: Name item given definition with 50% accuracy given mod-to-max cues.      Assessment / Recommendations / Plan   Plan  Continue with current plan of care      Progression Toward Goals   Progression toward goals  Progressing toward goals       SLP Education - 04/08/19 1354    Education Details  Effects of receptive aphasia on communication    Person(s) Educated  Patient;Spouse    Methods  Explanation    Comprehension  Verbalized understanding         SLP Long Term Goals - 03/19/19 1535      SLP LONG TERM GOAL #1   Title  Patient will complete semantic feature word finding tasks with 80% accuracy.    Time  12    Period  Weeks    Status  New    Target Date  06/11/19      SLP LONG TERM GOAL #2   Title  Patient will generate grammatical and cogent sentence to complete simple/concrete linguistic  task with 80% accuracy.    Time  12    Period  Weeks    Status  New    Target Date  06/11/19      SLP LONG TERM GOAL #3   Title  Patient will complete 2 unit processing tasks with 80% accuracy without the need of repetition of task instructions or significant delays in responding.    Time  12    Period  Weeks    Status  New    Target Date  06/11/19      SLP LONG TERM GOAL #4   Title  Patient will demonstrate reading comprehension for sentences with 80% accuracy.    Time  12    Period  Weeks    Status  New    Target Date  06/11/19       Plan - 04/08/19 1356    Clinical Impression Statement  Patient presents with moderate Wernicke's Aphasia characterized by relatively fluent speech with poor information content, impaired auditory comprehension, reduced repetition abilities, and perseveration in conversation. Patient demonstrates progress with managing his anger and  frustration surrounding his communication challenges, though he remains reliant on cueing to complete his thoughts during moments of word retrieval difficulty. Patient is stimulable for word finding given minimal-moderate semantic/phonemic cueing. Patient is increasingly responsive to corrective feedback and verbal cueing for implementation of compensatory strategies during auditory comprehension tasks in the therapy setting. Will continue with skilled speech therapy for restorative and compensatory treatment of aphasia.    Speech Therapy Frequency  2x / week    Duration  Other (comment)    Treatment/Interventions  Multimodal communcation approach;Language facilitation;SLP instruction and feedback;Patient/family education;Cueing hierarchy;Compensatory techniques    Potential Considerations  Ability to learn/carryover information;Pain level;Family/community support;Co-morbidities;Previous level of function;Cooperation/participation level;Severity of impairments    Consulted and Agree with Plan of Care  Patient;Family member/caregiver    Family Member Consulted  Wife Peter Moore)       Patient will benefit from skilled therapeutic intervention in order to improve the following deficits and impairments:   Aphasia    Problem List There are no active problems to display for this patient.  Peter Sea, MS/CCC- SLP  Peter Moore 04/08/2019, 1:56 PM  [place] MAIN [facility] SERVICES [address] [place], [place], [address] Phone: [phone]   Fax:  [phone]   Name: Peter Moore MRN: [id number] Date of Birth: [birth year]

## 2019-04-13 ENCOUNTER — Ambulatory Visit: Payer: Medicare HMO | Admitting: Speech Pathology

## 2019-04-13 ENCOUNTER — Encounter: Payer: Self-pay | Admitting: Speech Pathology

## 2019-04-13 ENCOUNTER — Other Ambulatory Visit: Payer: Self-pay

## 2019-04-13 DIAGNOSIS — R4701 Aphasia: Secondary | ICD-10-CM | POA: Diagnosis not present

## 2019-04-13 NOTE — Therapy (Signed)
[place] [facility] MAIN [facility] SERVICES [address] [place], [place], [address] Phone: [phone]   Fax:  [phone]  Speech Language Pathology Treatment  Patient Details  Name: Jahvon Gosline MRN: [id number] Date of Birth: 06-07-1940 Referring Provider (SLP): Dr. Sherryll Burger   Encounter Date: 04/13/2019  End of Session - 04/13/19 1244    Visit Number  8    Number of Visits  25    Date for SLP Re-Evaluation  06/11/19    Authorization Type  Medicare    Authorization Time Period  start 03/19/2019    Authorization - Visit Number  8    Authorization - Number of Visits  10    SLP Start Time  0900    SLP Stop Time   0950    SLP Time Calculation (min)  50 min    Activity Tolerance  Patient tolerated treatment well       Past Medical History:  Diagnosis Date  . Coronary artery disease   . Diabetes mellitus without complication (HCC)   . Hypertension   . Myocardial infarction [facility])     Past Surgical History:  Procedure Laterality Date  . CARDIAC SURGERY    . TONSILLECTOMY      There were no vitals filed for this visit.  Subjective Assessment - 04/13/19 1243    Subjective  The patient was alert and cooperative throughout the therapy session. He and his wife shared about their Thanksgiving holiday.    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  He received inpatient rehab at [facility], several outpatient sessions at [facility], and home health therapy (not clear when this ended).       Treatment Provided   Treatment provided  Cognitive-Linquistic      Pain Assessment   Pain Assessment  No/denies pain      Cognitive-Linquistic Treatment   Treatment focused on  Aphasia    Skilled Treatment  Patient completed a series of semantic feature analysis tasks with 50% accuracy  independently. Given semantic/phonemic cueing, accuracy increased to 80%. Patient generated grammatical and cogent sentences to complete a simple/concrete linguistic task with 50% accuracy without interventions. Given moderate cueing to increase information content and to repair communication breakdowns, accuracy improved to 70%. Patient followed 2 unit commands with pictures with 80% accuracy independently without needing repetition of task instructions or significant delays in responding. Given corrective feedback, repetition of instructions, and longer response time, accuracy increased to 90%. Patient named common objects from drawings with 80% accuracy independently. Given minimal semantic cueing, accuracy improved to 100%. Patient demonstrated reading comprehension for sentences with 83% accuracy without interventions. Given minimal cueing for attention to errors and reference to key information, accuracy increased to 100%. Patient noted with perseveration across all therapy tasks.       Assessment / Recommendations / Plan   Plan  Continue with current plan of care      Progression Toward Goals   Progression toward goals  Progressing toward goals       SLP Education - 04/13/19 1243    Education Details  Try not to be so hard on yourself and give yourself some grace.    Person(s) Educated  Patient    Methods  Explanation  Comprehension  Verbalized understanding         SLP Long Term Goals - 03/19/19 1535      SLP LONG TERM GOAL #1   Title  Patient will complete semantic feature word finding tasks with 80% accuracy.    Time  12    Period  Weeks    Status  New    Target Date  06/11/19      SLP LONG TERM GOAL #2   Title  Patient will generate grammatical and cogent sentence to complete simple/concrete linguistic task with 80% accuracy.    Time  12    Period  Weeks    Status  New    Target Date  06/11/19      SLP LONG TERM GOAL #3   Title  Patient will complete 2 unit  processing tasks with 80% accuracy without the need of repetition of task instructions or significant delays in responding.    Time  12    Period  Weeks    Status  New    Target Date  06/11/19      SLP LONG TERM GOAL #4   Title  Patient will demonstrate reading comprehension for sentences with 80% accuracy.    Time  12    Period  Weeks    Status  New    Target Date  06/11/19       Plan - 04/13/19 1244    Clinical Impression Statement  Patient presents with moderate Wernicke's Aphasia characterized by relatively fluent speech with poor information content, impaired auditory comprehension, reduced repetition abilities, and perseveration in conversation. Patient demonstrates progress with managing his anger and frustration surrounding his communication challenges, though he remains reliant on cueing to complete his thoughts during moments of word retrieval difficulty. Patient is stimulable for word finding given minimal-moderate semantic/phonemic cueing. Patient is increasingly responsive to corrective feedback and verbal cueing for implementation of compensatory strategies during auditory comprehension tasks in the therapy setting. Will continue with skilled speech therapy for restorative and compensatory treatment of aphasia.    Speech Therapy Frequency  2x / week    Duration  Other (comment)    Treatment/Interventions  Multimodal communcation approach;Language facilitation;SLP instruction and feedback;Patient/family education;Cueing hierarchy;Compensatory techniques    Potential to Achieve Goals  Good    Potential Considerations  Ability to learn/carryover information;Pain level;Family/community support;Co-morbidities;Previous level of function;Cooperation/participation level;Severity of impairments    SLP Home Exercise Plan  Provided    Consulted and Agree with Plan of Care  Patient;Family member/caregiver       Patient will benefit from skilled therapeutic intervention in order to  improve the following deficits and impairments:   Aphasia    Problem List There are no active problems to display for this patient.  [place] Spainhower A. Francis Dowse., Graduate Clinician Vella Kohler 04/13/2019, 12:45 PM  [place] MAIN [facility] SERVICES [address] [place], [place], [address] Phone: [phone]   Fax:  [phone]   Name: Cleo Villamizar MRN: [id number] Date of Birth: Feb 12, 1941

## 2019-04-15 ENCOUNTER — Other Ambulatory Visit: Payer: Self-pay

## 2019-04-15 ENCOUNTER — Ambulatory Visit: Payer: Medicare HMO | Attending: Neurology | Admitting: Speech Pathology

## 2019-04-15 ENCOUNTER — Encounter: Payer: Self-pay | Admitting: Speech Pathology

## 2019-04-15 DIAGNOSIS — R4701 Aphasia: Secondary | ICD-10-CM | POA: Insufficient documentation

## 2019-04-15 NOTE — Therapy (Signed)
[facility] MAIN [facility] SERVICES [address] [place], [place], [address] Phone: [phone]   Fax:  [phone]  Speech Language Pathology Treatment  Patient Details  Name: Peter Moore MRN: [id number] Date of Birth: 24-Sep-1940 Referring Provider (SLP): Dr. Sherryll Burger   Encounter Date: 04/15/2019  End of Session - 04/15/19 1248    Visit Number  9    Number of Visits  25    Date for SLP Re-Evaluation  06/11/19    Authorization Type  Medicare    Authorization Time Period  start 03/19/2019    Authorization - Visit Number  9    Authorization - Number of Visits  10    SLP Start Time  0900    SLP Stop Time   0950    SLP Time Calculation (min)  50 min    Activity Tolerance  Patient tolerated treatment well       Past Medical History:  Diagnosis Date  . Coronary artery disease   . Diabetes mellitus without complication (HCC)   . Hypertension   . Myocardial infarction [facility])     Past Surgical History:  Procedure Laterality Date  . CARDIAC SURGERY    . TONSILLECTOMY      There were no vitals filed for this visit.  Subjective Assessment - 04/15/19 1245    Subjective  The patient was alert and cooperative throughout the therapy session. He and his wife were glad to learn of the valet parking option today.    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  He received inpatient rehab at [facility], several outpatient sessions at [facility], and home health therapy (not clear when this ended).       Treatment Provided   Treatment provided  Cognitive-Linquistic      Pain Assessment   Pain Assessment  No/denies pain      Cognitive-Linquistic Treatment   Treatment focused on  Aphasia    Skilled Treatment  Patient completed a series of semantic feature analysis tasks with  55% accuracy independently. Given semantic/phonemic cueing, accuracy increased to 90%. Patient generated grammatical and cogent sentences to complete a simple/concrete linguistic task with 50% accuracy without interventions. Given modeling for increased information content and moderate cueing to repair communication breakdowns, accuracy improved to 85%. Patient named common objects from drawings with 70% accuracy independently. Given minimal semantic/phonemic cueing, accuracy improved to 100%. Patient demonstrated reading comprehension for sentences with 88% accuracy without interventions. Given minimal cueing for attention to errors and reference to key information, accuracy increased to 100%. Patient read a paragraph aloud with 80% reading fluency independently. Given minimal cueing, reading fluency improved to 95%. Patient demonstrated reading comprehension for the paragraph with 50% accuracy without interventions. Given moderate cueing, accuracy increased to 75%. Patient noted with perseveration across all therapy tasks.       Assessment / Recommendations / Plan   Plan  Continue with current plan of care      Progression Toward Goals   Progression toward goals  Progressing toward goals       SLP Education - 04/15/19 1245    Education Details  Your awareness of your language errors is a strength, but it works against you if you beat yourself up  for it every time.    Person(s) Educated  Patient    Methods  Explanation    Comprehension  Verbalized understanding         SLP Long Term Goals - 03/19/19 1535      SLP LONG TERM GOAL #1   Title  Patient will complete semantic feature word finding tasks with 80% accuracy.    Time  12    Period  Weeks    Status  New    Target Date  06/11/19      SLP LONG TERM GOAL #2   Title  Patient will generate grammatical and cogent sentence to complete simple/concrete linguistic task with 80% accuracy.    Time  12    Period  Weeks    Status  New     Target Date  06/11/19      SLP LONG TERM GOAL #3   Title  Patient will complete 2 unit processing tasks with 80% accuracy without the need of repetition of task instructions or significant delays in responding.    Time  12    Period  Weeks    Status  New    Target Date  06/11/19      SLP LONG TERM GOAL #4   Title  Patient will demonstrate reading comprehension for sentences with 80% accuracy.    Time  12    Period  Weeks    Status  New    Target Date  06/11/19       Plan - 04/15/19 1248    Clinical Impression Statement  Patient presents with moderate Wernicke's Aphasia characterized by relatively fluent speech with poor information content, impaired auditory comprehension, reduced repetition abilities, and perseveration in conversation. Patient demonstrates progress with managing his anger and frustration surrounding his communication challenges, and he is increasingly independent with making the effort to complete his thoughts during moments of word retrieval difficulty. Patient is stimulable for word finding given minimal-moderate semantic/phonemic cueing. Patient is increasingly responsive to corrective feedback and verbal cueing for implementation of compensatory strategies during auditory comprehension tasks in the therapy setting. Will continue with skilled speech therapy for restorative and compensatory treatment of aphasia.    Speech Therapy Frequency  2x / week    Duration  Other (comment)    Treatment/Interventions  Multimodal communcation approach;Language facilitation;SLP instruction and feedback;Patient/family education;Cueing hierarchy;Compensatory techniques    Potential to Achieve Goals  Good    Potential Considerations  Ability to learn/carryover information;Pain level;Family/community support;Co-morbidities;Previous level of function;Cooperation/participation level;Severity of impairments    SLP Home Exercise Plan  Provided    Consulted and Agree with Plan of Care   Patient;Family member/caregiver    Family Member Consulted  [name])       Patient will benefit from skilled therapeutic intervention in order to improve the following deficits and impairments:   Aphasia    Problem List There are no active problems to display for this patient.  Ronald Londo A. Francis Dowse., Graduate Clinician Vella Kohler 04/15/2019, 12:49 PM  [place] MAIN [facility] SERVICES [address] [place], [place], [address] Phone: [phone]   Fax:  [phone]   Name: Peter Moore MRN: [id number] Date of Birth: 02-06-1941

## 2019-04-20 ENCOUNTER — Ambulatory Visit: Payer: Medicare HMO | Admitting: Speech Pathology

## 2019-04-20 ENCOUNTER — Other Ambulatory Visit: Payer: Self-pay

## 2019-04-20 DIAGNOSIS — R4701 Aphasia: Secondary | ICD-10-CM

## 2019-04-21 ENCOUNTER — Encounter: Payer: Self-pay | Admitting: Speech Pathology

## 2019-04-21 NOTE — Therapy (Signed)
[facility] MAIN REHAB SERVICES [address] [place], [place], [address] Phone: [phone]   Fax:  [phone]  Speech Language Pathology Treatment  Speech Therapy Progress Note   Dates of reporting period  03/19/2019   to   04/20/2019   Patient Details  Name: Peter Moore MRN: [id number] Date of Birth: 04/13/1941 Referring Provider (SLP): Dr. Shah   Encounter Date: 04/20/2019  End of Session - 04/21/19 1041    Visit Number  10    Number of Visits  25    Date for SLP Re-Evaluation  06/11/19    Authorization Type  Medicare    Authorization Time Period  start 03/19/2019    Authorization - Visit Number  10    Authorization - Number of Visits  10    SLP Start Time  0900    SLP Stop Time   1000    SLP Time Calculation (min)  60 min    Activity Tolerance  Patient tolerated treatment well       Past Medical History:  Diagnosis Date  . Coronary artery disease   . Diabetes mellitus without complication (HCC)   . Hypertension   . Myocardial infarction (HCC)     Past Surgical History:  Procedure Laterality Date  . CARDIAC SURGERY    . TONSILLECTOMY      There were no vitals filed for this visit.  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  He received inpatient rehab at [facility], several outpatient sessions at [facility], and home health therapy (not clear when this ended).       Treatment Provided   Treatment provided  Cognitive-Linquistic      Pain Assessment   Pain Assessment  No/denies pain      Cognitive-Linquistic Treatment   Treatment focused on  Aphasia    Skilled Treatment  AUDITORY COMPREHENSION: Identify  stated word (semantic similarity) with 100% accuracy.  Follow 1-step commands with objects with 80% accuracy independently and 100% given min cues (repetition and emphasis).  Answer simple yes/no questions accurately.  Listen for details (answer question based on sentence read aloud) with 50% accuracy.  VERBAL EXPRESSION: Name pictured objects with 50% accuracy (initial 25% accurate), given semantic cues improves to 80% overall.   Name item given definition with 50% accuracy given mod-to-max cues.      Assessment / Recommendations / Plan   Plan  Continue with current plan of care      Progression Toward Goals   Progression toward goals  Progressing toward goals       SLP Education - 04/21/19 1039    Education Details  Every time you retrieve a word for yourself- that is a sucess    Person(s) Educated  Patient    Methods  Explanation    Comprehension  Verbalized understanding         SLP Long Term Goals - 04/21/19 1043      SLP LONG TERM GOAL #1   Title  Patient will complete semantic feature word finding tasks with 80% accuracy.    Time    8    Period  Weeks    Status  Partially Met    Target Date  06/11/19      SLP LONG TERM GOAL #2   Title  Patient will generate grammatical and cogent sentence to complete simple/concrete linguistic task with 80% accuracy.    Time  8    Period  Weeks    Status  Partially Met    Target Date  06/11/19      SLP LONG TERM GOAL #3   Title  Patient will complete 2 unit processing tasks with 80% accuracy without the need of repetition of task instructions or significant delays in responding.    Time  8    Period  Weeks    Status  Partially Met    Target Date  06/11/19      SLP LONG TERM GOAL #4   Title  Patient will demonstrate reading comprehension for sentences with 80% accuracy.    Time  8    Period  Weeks    Status  Partially Met    Target Date  06/11/19       Plan - 04/21/19 1042    Clinical Impression Statement  Patient presents  with moderate Wernicke's Aphasia characterized by relatively fluent speech with poor information content, impaired auditory comprehension, reduced repetition abilities, and perseveration in conversation. Patient demonstrates progress with managing his anger and frustration surrounding his communication challenges, and he is increasingly independent with making the effort to complete his thoughts during moments of word retrieval difficulty. Patient is stimulable for word finding given minimal-moderate semantic/phonemic cueing. Patient is increasingly responsive to corrective feedback and verbal cueing for implementation of compensatory strategies during auditory comprehension tasks in the therapy setting. Will continue with skilled speech therapy for restorative and compensatory treatment of aphasia.    Speech Therapy Frequency  2x / week    Duration  Other (comment)    Treatment/Interventions  Multimodal communcation approach;Language facilitation;SLP instruction and feedback;Patient/family education;Cueing hierarchy;Compensatory techniques    Potential to Achieve Goals  Good    Potential Considerations  Ability to learn/carryover information;Pain level;Family/community support;Co-morbidities;Previous level of function;Cooperation/participation level;Severity of impairments    Consulted and Agree with Plan of Care  Patient;Family member/caregiver    Family Member Consulted  Peter [name])       Patient will benefit from skilled therapeutic intervention in order to improve the following deficits and impairments:   Aphasia    Problem List There are no active problems to display for this patient.  Peter Sea, MS/CCC- SLP  Peter Moore 04/21/2019, 10:45 AM  [place] MAIN [facility] SERVICES [address] [place], [place], [address] Phone: [phone]   Fax:  [phone]   Name: Peter Moore MRN: [id number] Date of Birth: [birth year]

## 2019-04-22 ENCOUNTER — Encounter: Payer: Self-pay | Admitting: Speech Pathology

## 2019-04-22 ENCOUNTER — Other Ambulatory Visit: Payer: Self-pay

## 2019-04-22 ENCOUNTER — Ambulatory Visit: Payer: Medicare HMO | Admitting: Speech Pathology

## 2019-04-22 DIAGNOSIS — R4701 Aphasia: Secondary | ICD-10-CM

## 2019-04-22 NOTE — Therapy (Signed)
[place] MAIN [facility] SERVICES [address] [place], [place], [address] Phone: [phone]   Fax:  [phone]  Speech Language Pathology Treatment  Patient Details  Name: Peter Moore MRN: [id number] Date of Birth: 06-09-1940 Referring Provider (SLP): Dr. Manuella Ghazi   Encounter Date: 04/22/2019  End of Session - 04/22/19 1147    Visit Number  11    Number of Visits  25    Date for SLP Re-Evaluation  06/11/19    Authorization Type  Medicare    Authorization Time Period  04/22/2019    Authorization - Visit Number  1    Authorization - Number of Visits  10    SLP Start Time  0900    SLP Stop Time   9449    SLP Time Calculation (min)  55 min    Activity Tolerance  Patient tolerated treatment well       Past Medical History:  Diagnosis Date  . Coronary artery disease   . Diabetes mellitus without complication ([place])   . Hypertension   . Myocardial infarction [facility])     Past Surgical History:  Procedure Laterality Date  . CARDIAC SURGERY    . TONSILLECTOMY      There were no vitals filed for this visit.  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  He received inpatient rehab at [facility], several outpatient sessions at [facility], and home health therapy (not clear when this ended).       Treatment Provided   Treatment provided  Cognitive-Linquistic      Pain Assessment   Pain Assessment  No/denies pain      Cognitive-Linquistic Treatment   Treatment focused on  Aphasia    Skilled Treatment  AUDITORY COMPREHENSION:  Follow 1-step commands with objects with 85% accuracy independently and 100% given min cues (repetition and  emphasis).  VERBAL EXPRESSION: Generate sentence for simple action picture with 60% accuracy.   Name common objects 70% accuracy.  READING:  Demonstrate sentence comprehension (given 3 sentences, match appropriate sentence to picture) with 95% accuracy.  Reading fluency/accuracy is 65%, but he is comprehending sentence as a whole.      Assessment / Recommendations / Plan   Plan  Continue with current plan of care      Progression Toward Goals   Progression toward goals  Progressing toward goals       SLP Education - 04/22/19 1147    Education Details  Words will come more easily if you remain calm    Person(s) Educated  Patient    Methods  Explanation    Comprehension  Verbalized understanding         SLP Long Term Goals - 04/21/19 1043      SLP LONG TERM GOAL #1   Title  Patient will complete semantic feature word finding tasks with 80% accuracy.    Time  8    Period  Weeks    Status  Partially Met    Target Date  06/11/19      SLP LONG TERM GOAL #2  Title  Patient will generate grammatical and cogent sentence to complete simple/concrete linguistic task with 80% accuracy.    Time  8    Period  Weeks    Status  Partially Met    Target Date  06/11/19      SLP LONG TERM GOAL #3   Title  Patient will complete 2 unit processing tasks with 80% accuracy without the need of repetition of task instructions or significant delays in responding.    Time  8    Period  Weeks    Status  Partially Met    Target Date  06/11/19      SLP LONG TERM GOAL #4   Title  Patient will demonstrate reading comprehension for sentences with 80% accuracy.    Time  8    Period  Weeks    Status  Partially Met    Target Date  06/11/19       Plan - 04/22/19 1148    Clinical Impression Statement  Patient presents with moderate Wernicke's Aphasia characterized by relatively fluent speech with poor information content, impaired auditory comprehension, reduced repetition abilities, and perseveration  in conversation. Patient demonstrates progress with managing his anger and frustration surrounding his communication challenges, and he is increasingly independent with making the effort to complete his thoughts during moments of word retrieval difficulty. Patient is stimulable for word finding given minimal-moderate semantic/phonemic cueing. Patient is increasingly responsive to corrective feedback and verbal cueing for implementation of compensatory strategies during auditory comprehension tasks in the therapy setting. Will continue with skilled speech therapy for restorative and compensatory treatment of aphasia.    Speech Therapy Frequency  2x / week    Duration  Other (comment)    Treatment/Interventions  Multimodal communcation approach;Language facilitation;SLP instruction and feedback;Patient/family education;Cueing hierarchy;Compensatory techniques    Potential to Achieve Goals  Good    Potential Considerations  Ability to learn/carryover information;Pain level;Family/community support;Co-morbidities;Previous level of function;Cooperation/participation level;Severity of impairments    Consulted and Agree with Plan of Care  Patient;Family member/caregiver    Family Member Consulted  [name])       Patient will benefit from skilled therapeutic intervention in order to improve the following deficits and impairments:   Aphasia    Problem List There are no active problems to display for this patient.  [name], MS/CCC- SLP  [name] 04/22/2019, 11:49 AM  [place] MAIN [facility] SERVICES [address] [place], [place], [address] Phone: [phone]   Fax:  [phone]   Name: Sameer Teeple MRN: [id number] Date of Birth: Nov 03, 1940

## 2019-04-27 ENCOUNTER — Other Ambulatory Visit: Payer: Self-pay

## 2019-04-27 ENCOUNTER — Encounter: Payer: Self-pay | Admitting: Speech Pathology

## 2019-04-27 ENCOUNTER — Ambulatory Visit: Payer: Medicare HMO | Admitting: Speech Pathology

## 2019-04-27 DIAGNOSIS — R4701 Aphasia: Secondary | ICD-10-CM | POA: Diagnosis not present

## 2019-04-27 NOTE — Therapy (Signed)
[place] MAIN [facility] SERVICES [address] [place], [place], [address] Phone: [phone]   Fax:  [phone]  Speech Language Pathology Treatment  Patient Details  Name: Peter Moore MRN: [id number] Date of Birth: 11/26/1940 Referring Provider (SLP): Dr. Manuella Ghazi   Encounter Date: 04/27/2019  End of Session - 04/27/19 1010    Visit Number  12    Number of Visits  25    Date for SLP Re-Evaluation  06/11/19    Authorization Type  Medicare    Authorization Time Period  04/22/2019    Authorization - Visit Number  2    Authorization - Number of Visits  10    SLP Start Time  0900    SLP Stop Time   0950    SLP Time Calculation (min)  50 min    Activity Tolerance  Patient tolerated treatment well       Past Medical History:  Diagnosis Date  . Coronary artery disease   . Diabetes mellitus without complication ([place])   . Hypertension   . Myocardial infarction [facility])     Past Surgical History:  Procedure Laterality Date  . CARDIAC SURGERY    . TONSILLECTOMY      There were no vitals filed for this visit.  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  He received inpatient rehab at [facility], several outpatient sessions at [facility], and home health therapy (not clear when this ended).       Treatment Provided   Treatment provided  Cognitive-Linquistic      Pain Assessment   Pain Assessment  No/denies pain      Cognitive-Linquistic Treatment   Treatment focused on  Aphasia    Skilled Treatment  AUDITORY COMPREHENSION:  Follow 1-step commands with objects with 85% accuracy independently and 100% given min cues  (repetition and emphasis).  VERBAL EXPRESSION: Generate sentence for simple action picture with 65% accuracy.   Name common objects 70% accuracy.  READING:  Demonstrate sentence comprehension (answer question RE: sentence) with 60% accuracy.  Reading fluency/accuracy is 65%, but he is comprehending sentence as a whole.      Assessment / Recommendations / Plan   Plan  Continue with current plan of care      Progression Toward Goals   Progression toward goals  Progressing toward goals       SLP Education - 04/27/19 1010    Education Details  Word finding strategies    Person(s) Educated  Patient    Methods  Explanation    Comprehension  Verbalized understanding         SLP Long Term Goals - 04/21/19 1043      SLP LONG TERM GOAL #1   Title  Patient will complete semantic feature word finding tasks with 80% accuracy.    Time  8    Period  Weeks    Status  Partially Met    Target Date  06/11/19      SLP LONG TERM GOAL #2   Title  Patient will generate grammatical and  cogent sentence to complete simple/concrete linguistic task with 80% accuracy.    Time  8    Period  Weeks    Status  Partially Met    Target Date  06/11/19      SLP LONG TERM GOAL #3   Title  Patient will complete 2 unit processing tasks with 80% accuracy without the need of repetition of task instructions or significant delays in responding.    Time  8    Period  Weeks    Status  Partially Met    Target Date  06/11/19      SLP LONG TERM GOAL #4   Title  Patient will demonstrate reading comprehension for sentences with 80% accuracy.    Time  8    Period  Weeks    Status  Partially Met    Target Date  06/11/19       Plan - 04/27/19 1011    Clinical Impression Statement  Patient presents with moderate Wernicke's Aphasia characterized by relatively fluent speech with poor information content, impaired auditory comprehension, reduced repetition abilities, and perseveration in conversation. Patient  demonstrates progress with managing his anger and frustration surrounding his communication challenges, and he is increasingly independent with making the effort to complete his thoughts during moments of word retrieval difficulty. Patient is stimulable for word finding given minimal-moderate semantic/phonemic cueing. Patient is increasingly responsive to corrective feedback and verbal cueing for implementation of compensatory strategies during auditory comprehension tasks in the therapy setting. Will continue with skilled speech therapy for restorative and compensatory treatment of aphasia.    Speech Therapy Frequency  2x / week    Duration  Other (comment)    Treatment/Interventions  Multimodal communcation approach;Language facilitation;SLP instruction and feedback;Patient/family education;Cueing hierarchy;Compensatory techniques    Potential to Achieve Goals  Good    Potential Considerations  Ability to learn/carryover information;Pain level;Family/community support;Co-morbidities;Previous level of function;Cooperation/participation level;Severity of impairments    Consulted and Agree with Plan of Care  Patient;Family member/caregiver    Family Member Consulted  [name])       Patient will benefit from skilled therapeutic intervention in order to improve the following deficits and impairments:   Aphasia    Problem List There are no problems to display for this patient.  [name], MS/CCC- SLP  [name] 04/27/2019, 10:12 AM  [place] MAIN [facility] SERVICES [address] [place], [place], [address] Phone: [phone]   Fax:  [phone]   Name: Peter Moore MRN: [id number] Date of Birth: 03-17-1941

## 2019-04-29 ENCOUNTER — Other Ambulatory Visit: Payer: Self-pay

## 2019-04-29 ENCOUNTER — Encounter: Payer: Self-pay | Admitting: Speech Pathology

## 2019-04-29 ENCOUNTER — Ambulatory Visit: Payer: Medicare HMO | Admitting: Speech Pathology

## 2019-04-29 DIAGNOSIS — R4701 Aphasia: Secondary | ICD-10-CM

## 2019-04-29 NOTE — Therapy (Signed)
[place] MAIN [facility] SERVICES [address] [place], [place], [address] Phone: [phone]   Fax:  [phone]  Speech Language Pathology Treatment  Patient Details  Name: Peter Moore MRN: [id number] Date of Birth: 28-Nov-1940 Referring Provider (SLP): Dr. Manuella Ghazi   Encounter Date: 04/29/2019  End of Session - 04/29/19 1504    Visit Number  13    Number of Visits  25    Date for SLP Re-Evaluation  06/11/19    Authorization Type  Medicare    Authorization Time Period  04/22/2019    Authorization - Visit Number  3    Authorization - Number of Visits  10    SLP Start Time  0900    SLP Stop Time   0950    SLP Time Calculation (min)  50 min    Activity Tolerance  Patient tolerated treatment well       Past Medical History:  Diagnosis Date  . Coronary artery disease   . Diabetes mellitus without complication ([place])   . Hypertension   . Myocardial infarction [facility])     Past Surgical History:  Procedure Laterality Date  . CARDIAC SURGERY    . TONSILLECTOMY      There were no vitals filed for this visit.  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  He received inpatient rehab at [facility], several outpatient sessions at [facility], and home health therapy (not clear when this ended).       Treatment Provided   Treatment provided  Cognitive-Linquistic      Pain Assessment   Pain Assessment  No/denies pain      Cognitive-Linquistic Treatment   Treatment focused on  Aphasia    Skilled Treatment  AUDITORY COMPREHENSION:  Follow 1-step commands with objects with 90% accuracy independently.  VERBAL EXPRESSION: Generate sentence for simple action picture with  65% accuracy.   Name pictured objects with 90% accuracy; patient not able to answer semantic feature question regarding objects.  READING:  Demonstrate sentence comprehension (answer question RE: sentence) with 80% accuracy.  Reading fluency/accuracy is 80%.  Read/follow written 2-step commands with objects with 80% accuracy.      Assessment / Recommendations / Plan   Plan  Continue with current plan of care      Progression Toward Goals   Progression toward goals  Progressing toward goals       SLP Education - 04/29/19 1504    Education Details  Word finding strategies    Person(s) Educated  Patient;Spouse    Methods  Explanation    Comprehension  Verbalized understanding         SLP Long Term Goals - 04/21/19 1043      SLP LONG TERM GOAL #1   Title  Patient will complete semantic feature word finding tasks with 80% accuracy.    Time  8    Period  Weeks    Status  Partially Met    Target Date  06/11/19      SLP LONG TERM GOAL #2   Title  Patient will generate grammatical and cogent sentence to complete simple/concrete  linguistic task with 80% accuracy.    Time  8    Period  Weeks    Status  Partially Met    Target Date  06/11/19      SLP LONG TERM GOAL #3   Title  Patient will complete 2 unit processing tasks with 80% accuracy without the need of repetition of task instructions or significant delays in responding.    Time  8    Period  Weeks    Status  Partially Met    Target Date  06/11/19      SLP LONG TERM GOAL #4   Title  Patient will demonstrate reading comprehension for sentences with 80% accuracy.    Time  8    Period  Weeks    Status  Partially Met    Target Date  06/11/19       Plan - 04/29/19 1505    Clinical Impression Statement  Patient presents with moderate Wernicke's Aphasia characterized by relatively fluent speech with poor information content, impaired auditory comprehension, reduced repetition abilities, and perseveration in conversation.  Patient demonstrates progress with managing his anger and frustration surrounding his communication challenges, and he is increasingly independent with making the effort to complete his thoughts during moments of word retrieval difficulty. Patient is stimulable for word finding given minimal-moderate semantic/phonemic cueing. Patient is increasingly responsive to corrective feedback and verbal cueing for implementation of compensatory strategies during auditory comprehension tasks in the therapy setting. Will continue with skilled speech therapy for restorative and compensatory treatment of aphasia.    Speech Therapy Frequency  2x / week    Duration  Other (comment)    Treatment/Interventions  Multimodal communcation approach;Language facilitation;SLP instruction and feedback;Patient/family education;Cueing hierarchy;Compensatory techniques    Potential to Achieve Goals  Good    Potential Considerations  Ability to learn/carryover information;Pain level;Family/community support;Co-morbidities;Previous level of function;Cooperation/participation level;Severity of impairments    Consulted and Agree with Plan of Care  Patient;Family member/caregiver    Family Member Consulted  Wife Peter Moore)       Patient will benefit from skilled therapeutic intervention in order to improve the following deficits and impairments:   Aphasia    Problem List There are no problems to display for this patient.  [name], [place], Susie 04/29/2019, 3:05 PM  [place] MAIN [facility] SERVICES [address] [place], [place], [address] Phone: [phone]   Fax:  [phone]   Name: Peter Moore MRN: [id number] Date of Birth: 01/31/1941

## 2019-05-04 ENCOUNTER — Ambulatory Visit: Payer: Medicare HMO | Admitting: Speech Pathology

## 2019-05-04 ENCOUNTER — Other Ambulatory Visit: Payer: Self-pay

## 2019-05-04 DIAGNOSIS — R4701 Aphasia: Secondary | ICD-10-CM

## 2019-05-05 ENCOUNTER — Encounter: Payer: Self-pay | Admitting: Speech Pathology

## 2019-05-05 NOTE — Therapy (Signed)
[place] MAIN [facility] SERVICES [address] [place], [place], [address] Phone: [phone]   Fax:  [phone]  Speech Language Pathology Treatment  Patient Details  Name: Peter Moore MRN: [id number] Date of Birth: Nov 09, 1940 Referring Provider (SLP): Dr. Manuella Ghazi   Encounter Date: 05/04/2019  End of Session - 05/05/19 1141    Visit Number  14    Number of Visits  25    Date for SLP Re-Evaluation  06/11/19    Authorization Type  Medicare    Authorization Time Period  04/22/2019    Authorization - Visit Number  4    Authorization - Number of Visits  10    SLP Start Time  0900    SLP Stop Time   0950    SLP Time Calculation (min)  50 min    Activity Tolerance  Patient tolerated treatment well       Past Medical History:  Diagnosis Date  . Coronary artery disease   . Diabetes mellitus without complication ([place])   . Hypertension   . Myocardial infarction [facility])     Past Surgical History:  Procedure Laterality Date  . CARDIAC SURGERY    . TONSILLECTOMY      There were no vitals filed for this visit.  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  He received inpatient rehab at [facility], several outpatient sessions at [facility], and home health therapy (not clear when this ended).       Treatment Provided   Treatment provided  Cognitive-Linquistic      Pain Assessment   Pain Assessment  No/denies pain      Cognitive-Linquistic Treatment   Treatment focused on  Aphasia    Skilled Treatment  VERBAL EXPRESSION: Generate sentence for simple action picture with 65% accuracy.   Name pictured objects with 90%  accuracy; patient not able to answer semantic feature question regarding objects.  Name opposites with 70% accuracy.  READING:  Demonstrate sentence comprehension (answer question RE: simple sentence) with 80% accuracy.  Reading fluency/accuracy is 80%.  Patient has significantly more difficulty with lengthy or complex sentence comprehension.      Assessment / Recommendations / Plan   Plan  Continue with current plan of care      Progression Toward Goals   Progression toward goals  Progressing toward goals       SLP Education - 05/05/19 1140    Education Details  Word finding strategies    Person(s) Educated  Patient;Spouse    Methods  Explanation    Comprehension  Verbalized understanding         SLP Long Term Goals - 04/21/19 1043      SLP LONG TERM GOAL #1   Title  Patient will complete semantic feature word finding tasks with 80% accuracy.    Time  8    Period  Weeks    Status  Partially Met    Target Date  06/11/19      SLP LONG TERM GOAL #2   Title  Patient will generate grammatical  and cogent sentence to complete simple/concrete linguistic task with 80% accuracy.    Time  8    Period  Weeks    Status  Partially Met    Target Date  06/11/19      SLP LONG TERM GOAL #3   Title  Patient will complete 2 unit processing tasks with 80% accuracy without the need of repetition of task instructions or significant delays in responding.    Time  8    Period  Weeks    Status  Partially Met    Target Date  06/11/19      SLP LONG TERM GOAL #4   Title  Patient will demonstrate reading comprehension for sentences with 80% accuracy.    Time  8    Period  Weeks    Status  Partially Met    Target Date  06/11/19       Plan - 05/05/19 1141    Clinical Impression Statement  Patient presents with moderate Wernicke's Aphasia characterized by relatively fluent speech with poor information content, impaired auditory comprehension, reduced repetition abilities, and perseveration in  conversation. Patient demonstrates progress with managing his anger and frustration surrounding his communication challenges, and he is increasingly independent with making the effort to complete his thoughts during moments of word retrieval difficulty. Patient is stimulable for word finding given minimal-moderate semantic/phonemic cueing. Patient is increasingly responsive to corrective feedback and verbal cueing for implementation of compensatory strategies during auditory comprehension tasks in the therapy setting. Will continue with skilled speech therapy for restorative and compensatory treatment of aphasia.    Speech Therapy Frequency  2x / week    Duration  Other (comment)    Treatment/Interventions  Multimodal communcation approach;Language facilitation;SLP instruction and feedback;Patient/family education;Cueing hierarchy;Compensatory techniques    Potential to Achieve Goals  Good    Potential Considerations  Ability to learn/carryover information;Pain level;Family/community support;Co-morbidities;Previous level of function;Cooperation/participation level;Severity of impairments    Consulted and Agree with Plan of Care  Patient;Family member/caregiver    Family Member Consulted  [name])       Patient will benefit from skilled therapeutic intervention in order to improve the following deficits and impairments:   Aphasia    Problem List There are no problems to display for this patient.  [name], MS/CCC- SLP  [name] 05/05/2019, 11:42 AM  [place] MAIN [facility] SERVICES [address] [place], [place], [address] Phone: [phone]   Fax:  [phone]   Name: Peter Moore MRN: [id number] Date of Birth: [birth year]

## 2019-05-06 ENCOUNTER — Encounter: Payer: Self-pay | Admitting: Speech Pathology

## 2019-05-06 ENCOUNTER — Ambulatory Visit: Payer: Medicare HMO | Admitting: Speech Pathology

## 2019-05-06 ENCOUNTER — Other Ambulatory Visit: Payer: Self-pay

## 2019-05-06 DIAGNOSIS — R4701 Aphasia: Secondary | ICD-10-CM

## 2019-05-06 NOTE — Therapy (Signed)
[place] MAIN [facility] SERVICES [address] [place], [place], [address] Phone: [phone]   Fax:  [phone]  Speech Language Pathology Treatment  Patient Details  Name: Keonta Alsip MRN: [id number] Date of Birth: 1940/12/11 Referring Provider (SLP): Dr. Manuella Ghazi   Encounter Date: 05/06/2019  End of Session - 05/06/19 1036    Visit Number  15    Number of Visits  25    Date for SLP Re-Evaluation  06/11/19    Authorization Type  Medicare    Authorization Time Period  04/22/2019    Authorization - Visit Number  5    Authorization - Number of Visits  10    SLP Start Time  0900    SLP Stop Time   0950    SLP Time Calculation (min)  50 min    Activity Tolerance  Patient tolerated treatment well       Past Medical History:  Diagnosis Date  . Coronary artery disease   . Diabetes mellitus without complication ([place])   . Hypertension   . Myocardial infarction [facility])     Past Surgical History:  Procedure Laterality Date  . CARDIAC SURGERY    . TONSILLECTOMY      There were no vitals filed for this visit.  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  He received inpatient rehab at [facility], several outpatient sessions at [facility], and home health therapy (not clear when this ended).       Treatment Provided   Treatment provided  Cognitive-Linquistic      Pain Assessment   Pain Assessment  No/denies pain      Cognitive-Linquistic Treatment   Treatment focused on  Aphasia    Skilled Treatment  VERBAL EXPRESSION: Name opposites with 80% accuracy.  Patient not able to name synonyms but given choice of 3, names  synonyms with 90% accuracy.  Name associated word given choice of 3 with 100% accuracy.  Name item defined given mod-max semantic cues.  READING:  Identify written words that belong to written category given mod cues to retain category.      Assessment / Recommendations / Plan   Plan  Continue with current plan of care      Progression Toward Goals   Progression toward goals  Progressing toward goals       SLP Education - 05/06/19 1036    Education Details  Word finding strategies    Person(s) Educated  Patient;Spouse    Methods  Explanation;Demonstration    Comprehension  Verbalized understanding         SLP Long Term Goals - 04/21/19 1043      SLP LONG TERM GOAL #1   Title  Patient will complete semantic feature word finding tasks with 80% accuracy.    Time  8    Period  Weeks    Status  Partially Met    Target Date  06/11/19      SLP LONG TERM GOAL #2   Title  Patient will generate grammatical and cogent sentence to complete simple/concrete  linguistic task with 80% accuracy.    Time  8    Period  Weeks    Status  Partially Met    Target Date  06/11/19      SLP LONG TERM GOAL #3   Title  Patient will complete 2 unit processing tasks with 80% accuracy without the need of repetition of task instructions or significant delays in responding.    Time  8    Period  Weeks    Status  Partially Met    Target Date  06/11/19      SLP LONG TERM GOAL #4   Title  Patient will demonstrate reading comprehension for sentences with 80% accuracy.    Time  8    Period  Weeks    Status  Partially Met    Target Date  06/11/19       Plan - 05/06/19 1036    Clinical Impression Statement  Patient presents with moderate Wernicke's Aphasia characterized by relatively fluent speech with poor information content, impaired auditory comprehension, reduced repetition abilities, and perseveration in conversation. Patient demonstrates progress with managing his anger and frustration surrounding  his communication challenges, and he is increasingly independent with making the effort to complete his thoughts during moments of word retrieval difficulty. Patient is stimulable for word finding given minimal-moderate semantic/phonemic cueing. Patient is increasingly responsive to corrective feedback and verbal cueing for implementation of compensatory strategies during auditory comprehension tasks in the therapy setting. Will continue with skilled speech therapy for restorative and compensatory treatment of aphasia.       Patient will benefit from skilled therapeutic intervention in order to improve the following deficits and impairments:   Aphasia    Problem List There are no problems to display for this patient.  [name], MS/CCC- SLP  [name] 05/06/2019, 10:37 AM  [place] MAIN [facility] SERVICES [address] [place], [place], [address] Phone: [phone]   Fax:  [phone]   Name: Dalan Cowger MRN: [id number] Date of Birth: [birth year]

## 2019-05-11 ENCOUNTER — Other Ambulatory Visit: Payer: Self-pay

## 2019-05-11 ENCOUNTER — Ambulatory Visit: Payer: Medicare HMO | Admitting: Speech Pathology

## 2019-05-11 DIAGNOSIS — R4701 Aphasia: Secondary | ICD-10-CM

## 2019-05-12 ENCOUNTER — Encounter: Payer: Self-pay | Admitting: Speech Pathology

## 2019-05-12 NOTE — Therapy (Signed)
[place] MAIN [facility] SERVICES [address] [place], [place], [address] Phone: [phone]   Fax:  [phone]  Speech Language Pathology Evaluation  Patient Details  Name: Peter Moore MRN: [id number] Date of Birth: 05-02-1941 Referring Provider (SLP): Dr. Manuella Ghazi   Encounter Date: 05/11/2019  End of Session - 05/12/19 1159    Visit Number  16    Number of Visits  25    Date for SLP Re-Evaluation  06/11/19    Authorization Type  Medicare    Authorization Time Period  04/22/2019    Authorization - Visit Number  6    Authorization - Number of Visits  10    SLP Start Time  0900    SLP Stop Time   0950    SLP Time Calculation (min)  50 min    Activity Tolerance  Patient tolerated treatment well       Past Medical History:  Diagnosis Date  . Coronary artery disease   . Diabetes mellitus without complication ([place])   . Hypertension   . Myocardial infarction [facility])     Past Surgical History:  Procedure Laterality Date  . CARDIAC SURGERY    . TONSILLECTOMY      There were no vitals filed for this visit.  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  He received inpatient rehab at [facility], several outpatient sessions at [facility], and home health therapy (not clear when this ended).       Treatment Provided   Treatment provided  Cognitive-Linquistic      Pain Assessment   Pain Assessment  No/denies pain      Cognitive-Linquistic Treatment   Treatment focused on  Aphasia    Skilled Treatment  VERBAL EXPRESSION: Name common objects with 90% accuracy.  Answer semantic feature question RE: objects with 70% accuracy.  READING:  Select  appropriate word (F=3) fo complete phrase/sentence with 65% accuracy, independently.   Answer yes/no questions RE: 2-3 written sentences with 95% accuracy.  Answer questions RE: written weather report and medication label with 70% accuracy.      Assessment / Recommendations / Plan   Plan  Continue with current plan of care      Progression Toward Goals   Progression toward goals  Progressing toward goals        SLP Education - 05/12/19 1159    Education Details  word finding strategies    Person(s) Educated  Patient;Spouse         SLP Long Term Goals - 04/21/19 1043      SLP LONG TERM GOAL #1   Title  Patient will complete semantic feature word finding tasks with 80% accuracy.    Time  8    Period  Weeks    Status  Partially Met    Target Date  06/11/19      SLP LONG TERM GOAL #2   Title  Patient will generate grammatical and cogent sentence to complete simple/concrete linguistic task with 80% accuracy.    Time  8  Period  Weeks    Status  Partially Met    Target Date  06/11/19      SLP LONG TERM GOAL #3   Title  Patient will complete 2 unit processing tasks with 80% accuracy without the need of repetition of task instructions or significant delays in responding.    Time  8    Period  Weeks    Status  Partially Met    Target Date  06/11/19      SLP LONG TERM GOAL #4   Title  Patient will demonstrate reading comprehension for sentences with 80% accuracy.    Time  8    Period  Weeks    Status  Partially Met    Target Date  06/11/19       Plan - 05/12/19 1200    Clinical Impression Statement  Patient presents with moderate Wernicke's Aphasia characterized by relatively fluent speech with poor information content, impaired auditory comprehension, reduced repetition abilities, and perseveration in conversation. Patient demonstrates progress with managing his anger and frustration surrounding his communication challenges, and he is increasingly independent with making  the effort to complete his thoughts during moments of word retrieval difficulty. Patient is stimulable for word finding given minimal-moderate semantic/phonemic cueing. Patient is increasingly responsive to corrective feedback and verbal cueing for implementation of compensatory strategies during auditory comprehension tasks in the therapy setting. Will continue with skilled speech therapy for restorative and compensatory treatment of aphasia.    Speech Therapy Frequency  2x / week    Duration  Other (comment)    Treatment/Interventions  Multimodal communcation approach;Language facilitation;SLP instruction and feedback;Patient/family education;Cueing hierarchy;Compensatory techniques    Potential to Achieve Goals  Good    Potential Considerations  Ability to learn/carryover information;Pain level;Family/community support;Co-morbidities;Previous level of function;Cooperation/participation level;Severity of impairments    Consulted and Agree with Plan of Care  Patient;Family member/caregiver    Family Member Consulted  [name])       Patient will benefit from skilled therapeutic intervention in order to improve the following deficits and impairments:   Aphasia    Problem List There are no problems to display for this patient.  [name], MS/CCC- SLP  [name] 05/12/2019, 12:01 PM  [place] MAIN [facility] SERVICES [address] [place], [place], [address] Phone: [phone]   Fax:  [phone]  Name: Peter Moore MRN: [id number] Date of Birth: 01/30/1941

## 2019-05-13 ENCOUNTER — Ambulatory Visit: Payer: Medicare HMO | Admitting: Speech Pathology

## 2019-05-13 ENCOUNTER — Other Ambulatory Visit: Payer: Self-pay

## 2019-05-13 DIAGNOSIS — R4701 Aphasia: Secondary | ICD-10-CM

## 2019-05-14 ENCOUNTER — Encounter: Payer: Self-pay | Admitting: Speech Pathology

## 2019-05-14 NOTE — Therapy (Signed)
[place] MAIN [facility] SERVICES [address] [place], [place], [address] Phone: [phone]   Fax:  [phone]  Speech Language Pathology Treatment  Patient Details  Name: Peter Moore MRN: [id number] Date of Birth: 03-Apr-1941 Referring Provider (SLP): Dr. Manuella Ghazi   Encounter Date: 05/13/2019  End of Session - 05/14/19 1502    Visit Number  17    Number of Visits  25    Date for SLP Re-Evaluation  06/11/19    Authorization Type  Medicare    Authorization Time Period  04/22/2019    Authorization - Visit Number  7    Authorization - Number of Visits  10    SLP Start Time  0900    SLP Stop Time   0950    SLP Time Calculation (min)  50 min    Activity Tolerance  Patient tolerated treatment well       Past Medical History:  Diagnosis Date  . Coronary artery disease   . Diabetes mellitus without complication ([place])   . Hypertension   . Myocardial infarction [facility])     Past Surgical History:  Procedure Laterality Date  . CARDIAC SURGERY    . TONSILLECTOMY      There were no vitals filed for this visit.  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  He received inpatient rehab at [facility], several outpatient sessions at [facility], and home health therapy (not clear when this ended).       Treatment Provided   Treatment provided  Cognitive-Linquistic      Pain Assessment   Pain Assessment  No/denies pain      Cognitive-Linquistic Treatment   Treatment focused on  Aphasia    Skilled Treatment  VERBAL EXPRESSION: State associated word with 80% accuracy given min cues.  READING:  Select appropriate word (F=3) to  name item given 3 clue words with 80% accuracy, independently.   Answer written yes/no questions with 95% accuracy, given min cues to NOT be literal.        Assessment / Recommendations / [place] with current plan of care      Progression Toward Goals   Progression toward goals  Progressing toward goals       SLP Education - 05/14/19 1501    Education Details  word finding strategies    Person(s) Educated  Patient;Spouse    Methods  Explanation    Comprehension  Verbalized understanding         SLP Long Term Goals - 04/21/19 1043      SLP LONG TERM GOAL #1   Title  Patient will complete semantic feature word finding tasks with 80% accuracy.    Time  8    Period  Weeks    Status  Partially Met    Target Date  06/11/19      SLP LONG TERM GOAL #2   Title  Patient will generate grammatical and cogent sentence to complete simple/concrete linguistic task with 80% accuracy.    Time  8  Period  Weeks    Status  Partially Met    Target Date  06/11/19      SLP LONG TERM GOAL #3   Title  Patient will complete 2 unit processing tasks with 80% accuracy without the need of repetition of task instructions or significant delays in responding.    Time  8    Period  Weeks    Status  Partially Met    Target Date  06/11/19      SLP LONG TERM GOAL #4   Title  Patient will demonstrate reading comprehension for sentences with 80% accuracy.    Time  8    Period  Weeks    Status  Partially Met    Target Date  06/11/19       Plan - 05/14/19 1502    Clinical Impression Statement  Patient presents with moderate Wernicke's Aphasia characterized by relatively fluent speech with poor information content, impaired auditory comprehension, reduced repetition abilities, and perseveration in conversation. Patient demonstrates progress with managing his anger and frustration surrounding his communication challenges, and he is increasingly independent with making the effort to complete  his thoughts during moments of word retrieval difficulty. Patient is stimulable for word finding given minimal-moderate semantic/phonemic cueing. Patient is increasingly responsive to corrective feedback and verbal cueing for implementation of compensatory strategies during auditory comprehension tasks in the therapy setting. Will continue with skilled speech therapy for restorative and compensatory treatment of aphasia.    Speech Therapy Frequency  2x / week    Duration  Other (comment)    Treatment/Interventions  Multimodal communcation approach;Language facilitation;SLP instruction and feedback;Patient/family education;Cueing hierarchy;Compensatory techniques    Potential to Achieve Goals  Good    Potential Considerations  Ability to learn/carryover information;Pain level;Family/community support;Co-morbidities;Previous level of function;Cooperation/participation level;Severity of impairments    SLP Home Exercise Plan  Provided    Consulted and Agree with Plan of Care  Patient;Family member/caregiver    Family Member Consulted  [name])       Patient will benefit from skilled therapeutic intervention in order to improve the following deficits and impairments:   Aphasia    Problem List There are no problems to display for this patient.  [name], [place], Susie 05/14/2019, 3:03 PM  [place] MAIN [facility] SERVICES [address] [place], [place], [address] Phone: [phone]   Fax:  [phone]   Name: Peter Moore MRN: [id number] Date of Birth: 09/04/1940

## 2019-05-19 ENCOUNTER — Ambulatory Visit: Payer: Medicare HMO | Attending: Neurology | Admitting: Speech Pathology

## 2019-05-19 ENCOUNTER — Other Ambulatory Visit: Payer: Self-pay

## 2019-05-19 DIAGNOSIS — R4701 Aphasia: Secondary | ICD-10-CM | POA: Diagnosis present

## 2019-05-20 ENCOUNTER — Encounter: Payer: Self-pay | Admitting: Speech Pathology

## 2019-05-20 NOTE — Therapy (Signed)
[place] MAIN [facility] SERVICES [address] [place], [place], [address] Phone: [phone]   Fax:  [phone]  Speech Language Pathology Treatment  Patient Details  Name: Peter Moore MRN: [id number] Date of Birth: 05/06/1941 Referring Provider (SLP): Dr. Manuella Ghazi   Encounter Date: 05/19/2019  End of Session - 05/20/19 0822    Visit Number  18    Number of Visits  25    Date for SLP Re-Evaluation  06/11/19    Authorization Type  Medicare    Authorization Time Period  04/22/2019    Authorization - Visit Number  8    Authorization - Number of Visits  10    SLP Start Time  1600    SLP Stop Time   1650    SLP Time Calculation (min)  50 min    Activity Tolerance  Patient tolerated treatment well       Past Medical History:  Diagnosis Date  . Coronary artery disease   . Diabetes mellitus without complication ([place])   . Hypertension   . Myocardial infarction [facility])     Past Surgical History:  Procedure Laterality Date  . CARDIAC SURGERY    . TONSILLECTOMY      There were no vitals filed for this visit.  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  He received inpatient rehab at [facility], several outpatient sessions at [facility], and home health therapy (not clear when this ended).       Treatment Provided   Treatment provided  Cognitive-Linquistic      Pain Assessment   Pain Assessment  No/denies pain      Cognitive-Linquistic Treatment   Treatment focused on  Aphasia    Skilled Treatment  VERBAL EXPRESSION: State associated word with 80% accuracy given min cues.  Name common objects with 80% accuracy,  semantic cues helpful.  Answer semantic questions RE: objects given min-mod cues.  READING:  Select appropriate word (F=3) to name item given 3 clue words with 80% accuracy, independently.         Assessment / Recommendations / Plan   Plan  Continue with current plan of care      Progression Toward Goals   Progression toward goals  Progressing toward goals       SLP Education - 05/20/19 0821    Education Details  word finding strategies    Person(s) Educated  Patient;Spouse    Methods  Explanation    Comprehension  Verbalized understanding         SLP Long Term Goals - 04/21/19 1043      SLP LONG TERM GOAL #1   Title  Patient will complete semantic feature word finding tasks with 80% accuracy.    Time  8    Period  Weeks    Status  Partially Met    Target Date  06/11/19      SLP LONG TERM GOAL #2   Title  Patient will generate grammatical and cogent sentence to complete simple/concrete linguistic task with 80% accuracy.    Time  8    Period  Weeks    Status  Partially Met    Target Date  06/11/19      SLP LONG TERM GOAL #3   Title  Patient will complete 2 unit processing tasks with 80% accuracy without the need of repetition of task instructions or significant delays in responding.    Time  8    Period  Weeks    Status  Partially Met    Target Date  06/11/19      SLP LONG TERM GOAL #4   Title  Patient will demonstrate reading comprehension for sentences with 80% accuracy.    Time  8    Period  Weeks    Status  Partially Met    Target Date  06/11/19       Plan - 05/20/19 6153    Clinical Impression Statement  Patient presents with moderate Wernicke's Aphasia characterized by relatively fluent speech with poor information content, impaired auditory comprehension, reduced repetition abilities, and perseveration in conversation. Patient demonstrates progress with managing his anger and frustration surrounding his communication challenges, and he is increasingly  independent with making the effort to complete his thoughts during moments of word retrieval difficulty. Patient is stimulable for word finding given minimal-moderate semantic/phonemic cueing. Patient is increasingly responsive to corrective feedback and verbal cueing for implementation of compensatory strategies during auditory comprehension tasks in the therapy setting. Will continue with skilled speech therapy for restorative and compensatory treatment of aphasia.    Speech Therapy Frequency  2x / week    Duration  Other (comment)    Treatment/Interventions  Multimodal communcation approach;Language facilitation;SLP instruction and feedback;Patient/family education;Cueing hierarchy;Compensatory techniques    Potential to Achieve Goals  Good    SLP Home Exercise Plan  Provided    Consulted and Agree with Plan of Care  Patient;Family member/caregiver    Family Member Consulted  [name])       Patient will benefit from skilled therapeutic intervention in order to improve the following deficits and impairments:   Aphasia    Problem List There are no problems to display for this patient.  [name], MS/CCC- SLP  [name] 05/20/2019, 8:24 AM  [place] MAIN [facility] SERVICES [address] [place], [place], [address] Phone: [phone]   Fax:  [phone]   Name: Peter Moore MRN: [id number] Date of Birth: August 12, 1940

## 2019-05-25 ENCOUNTER — Other Ambulatory Visit: Payer: Self-pay

## 2019-05-25 ENCOUNTER — Ambulatory Visit: Payer: Medicare HMO | Admitting: Speech Pathology

## 2019-05-25 DIAGNOSIS — R4701 Aphasia: Secondary | ICD-10-CM

## 2019-05-26 ENCOUNTER — Encounter: Payer: Self-pay | Admitting: Speech Pathology

## 2019-05-26 NOTE — Therapy (Signed)
[place] MAIN [facility] SERVICES [address] [place], [place], [address] Phone: [phone]   Fax:  [phone]  Speech Language Pathology Treatment  Patient Details  Name: Peter Moore MRN: [id number] Date of Birth: Dec 28, 1940 Referring Provider (SLP): Dr. Manuella Ghazi   Encounter Date: 05/25/2019  End of Session - 05/26/19 0832    Visit Number  19    Number of Visits  25    Date for SLP Re-Evaluation  06/11/19    Authorization Type  Medicare    Authorization Time Period  04/22/2019    Authorization - Visit Number  9    Authorization - Number of Visits  10    SLP Start Time  1500    SLP Stop Time   1550    SLP Time Calculation (min)  50 min    Activity Tolerance  Patient tolerated treatment well       Past Medical History:  Diagnosis Date  . Coronary artery disease   . Diabetes mellitus without complication ([place])   . Hypertension   . Myocardial infarction [facility])     Past Surgical History:  Procedure Laterality Date  . CARDIAC SURGERY    . TONSILLECTOMY      There were no vitals filed for this visit.  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  He received inpatient rehab at [facility], several outpatient sessions at [facility], and home health therapy (not clear when this ended).       Treatment Provided   Treatment provided  Cognitive-Linquistic      Pain Assessment   Pain Assessment  No/denies pain      Cognitive-Linquistic Treatment   Treatment focused on  Aphasia    Skilled Treatment  VERBAL EXPRESSION: State associated word with 80% accuracy given min cues.  Name common objects with 80% accuracy, semantic cues helpful.  Answer semantic questions  RE: objects given min-mod cues.  READING:  Select appropriate word (F=3) to name item given 3 clue words with 80% accuracy, independently.   Select word (f=3) to state whole given part with 90% accuracy given constant cues to recall tasks.      Assessment / Recommendations / Plan   Plan  Continue with current plan of care      Progression Toward Goals   Progression toward goals  Progressing toward goals       SLP Education - 05/26/19 0832    Education Details  word finding strategies    Person(s) Educated  Patient;Spouse    Methods  Explanation    Comprehension  Verbalized understanding         SLP Long Term Goals - 04/21/19 1043      SLP LONG TERM GOAL #1   Title  Patient will complete semantic feature word finding tasks with 80% accuracy.    Time  8    Period  Weeks    Status  Partially Met    Target Date  06/11/19      SLP LONG TERM GOAL #2   Title  Patient will generate grammatical and cogent sentence to complete simple/concrete linguistic task with  80% accuracy.    Time  8    Period  Weeks    Status  Partially Met    Target Date  06/11/19      SLP LONG TERM GOAL #3   Title  Patient will complete 2 unit processing tasks with 80% accuracy without the need of repetition of task instructions or significant delays in responding.    Time  8    Period  Weeks    Status  Partially Met    Target Date  06/11/19      SLP LONG TERM GOAL #4   Title  Patient will demonstrate reading comprehension for sentences with 80% accuracy.    Time  8    Period  Weeks    Status  Partially Met    Target Date  06/11/19       Plan - 05/26/19 4859    Clinical Impression Statement  Patient presents with moderate Wernicke's Aphasia characterized by relatively fluent speech with poor information content, impaired auditory comprehension, reduced repetition abilities, and perseveration in conversation. Patient demonstrates progress with managing his anger and frustration surrounding his  communication challenges, and he is increasingly independent with making the effort to complete his thoughts during moments of word retrieval difficulty. Patient is stimulable for word finding given minimal-moderate semantic/phonemic cueing. Patient is increasingly responsive to corrective feedback and verbal cueing for implementation of compensatory strategies during auditory comprehension tasks in the therapy setting. Will continue with skilled speech therapy for restorative and compensatory treatment of aphasia.    Speech Therapy Frequency  2x / week    Duration  Other (comment)    Treatment/Interventions  Multimodal communcation approach;Language facilitation;SLP instruction and feedback;Patient/family education;Cueing hierarchy;Compensatory techniques    Potential to Achieve Goals  Good    Potential Considerations  Ability to learn/carryover information;Pain level;Family/community support;Co-morbidities;Previous level of function;Cooperation/participation level;Severity of impairments    Consulted and Agree with Plan of Care  Patient;Family member/caregiver    Family Member Consulted  [name])       Patient will benefit from skilled therapeutic intervention in order to improve the following deficits and impairments:   Aphasia    Problem List There are no problems to display for this patient.  [name], MS/CCC- SLP  [name] 05/26/2019, 8:34 AM  [place] MAIN [facility] SERVICES [address] [place], [place], [address] Phone: [phone]   Fax:  [phone]   Name: Peter Moore MRN: [id number] Date of Birth: 1940-10-12

## 2019-05-27 ENCOUNTER — Other Ambulatory Visit: Payer: Self-pay

## 2019-05-27 ENCOUNTER — Ambulatory Visit: Payer: Medicare HMO | Admitting: Speech Pathology

## 2019-05-27 ENCOUNTER — Encounter: Payer: Self-pay | Admitting: Speech Pathology

## 2019-05-27 DIAGNOSIS — R4701 Aphasia: Secondary | ICD-10-CM

## 2019-05-27 NOTE — Therapy (Signed)
[place] MAIN [facility] SERVICES [address] [place], [place], [address] Phone: [phone]   Fax:  [phone]  Speech Language Pathology Treatment/Progress Note  Speech Therapy Progress Note   Dates of reporting period  04/22/2019   to   05/27/2019   Patient Details  Name: Peter Moore MRN: [id number] Date of Birth: [birth year] Referring Provider (SLP): Dr. Manuella Ghazi   Encounter Date: 05/27/2019  End of Session - 05/27/19 1552    Visit Number  20    Number of Visits  25    Date for SLP Re-Evaluation  06/11/19    Authorization Type  Medicare    Authorization Time Period  04/22/2019    Authorization - Visit Number  10    Authorization - Number of Visits  10    SLP Start Time  1400    SLP Stop Time   1450    SLP Time Calculation (min)  50 min    Activity Tolerance  Patient tolerated treatment well       Past Medical History:  Diagnosis Date  . Coronary artery disease   . Diabetes mellitus without complication ([place])   . Hypertension   . Myocardial infarction [facility])     Past Surgical History:  Procedure Laterality Date  . CARDIAC SURGERY    . TONSILLECTOMY      There were no vitals filed for this visit.  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  He received inpatient rehab at [facility], several outpatient sessions at [facility], and home health therapy (not clear when this ended).       Treatment Provided   Treatment provided  Cognitive-Linquistic      Pain Assessment   Pain Assessment  No/denies pain      Cognitive-Linquistic Treatment   Treatment focused on  Aphasia    Skilled Treatment  Pt. was able  to independently produce 40% of the words on a semantic feature analysis task. Pt. benefited significantly from shaping/encouragement from both clinician and spouse during this task. With phonemic cueing, pt. was 75% accurate in production of semantic association with a visual. On a word finding task, pt. independently stated association words with 35% accuracy. Given phonemic cues, patient was 80% accurate. Pt. demonstrated frustration at task, stating "I know exactly what I want to say, and it just is wrong, and it won't come out." Pt. responded well to encouragement to keep speaking even if he knows it is wrong. On a word finding task, pt. was 80% accurate; increased to 100% accurate when prompted to try again.      Assessment / Recommendations / Plan   Plan  Continue with current plan of care      Progression Toward Goals   Progression toward goals  Progressing toward goals       SLP Education - 05/27/19 1552    Education Details  word finding strategies    Person(s) Educated  Patient;Spouse    Methods  Explanation    Comprehension  Verbalized understanding  SLP Long Term Goals - 05/27/19 1553      SLP LONG TERM GOAL #1   Title  Patient will complete semantic feature word finding tasks with 80% accuracy.    Status  Partially Met    Target Date  06/11/19      SLP LONG TERM GOAL #2   Title  Patient will generate grammatical and cogent sentence to complete simple/concrete linguistic task with 80% accuracy.    Period  Weeks    Status  Partially Met    Target Date  06/11/19      SLP LONG TERM GOAL #3   Title  Patient will complete 2 unit processing tasks with 80% accuracy without the need of repetition of task instructions or significant delays in responding.    Status  Partially Met    Target Date  06/11/19      SLP LONG TERM GOAL #4   Title  Patient will demonstrate reading comprehension for sentences with 80% accuracy.    Status  Partially Met    Target Date   06/11/19       Plan - 05/27/19 1553    Clinical Impression Statement  Peter Moore continues to express frustration at word-finding difficulty and jargon speech. He responds very well to phonetic cueing and is even better at responding to semantic cueing. Rhyming cues are ineffective for Peter Moore and cause more confusion. The final activity was very successful for Peter Moore and seemed to boost morale at the end of the session.  Wife confided that she feels Peter Moore is not making progress and wants to know what can be done at home. Plan to encourage Peter Moore to continue speaking, even if he knows it is wrong, and to continue using semantic feature cues. Plan to coach wife on strategies to be generalized at home.    Speech Therapy Frequency  2x / week    Duration  Other (comment)    Treatment/Interventions  Multimodal communcation approach;Language facilitation;SLP instruction and feedback;Patient/family education;Cueing hierarchy;Compensatory techniques    Potential to Achieve Goals  Good    Potential Considerations  Ability to learn/carryover information;Pain level;Family/community support;Co-morbidities;Previous level of function;Cooperation/participation level;Severity of impairments    SLP Home Exercise Plan  Provided    Consulted and Agree with Plan of Care  Patient;Family member/caregiver    Family Member Consulted  Wife Peter Moore)       Patient will benefit from skilled therapeutic intervention in order to improve the following deficits and impairments:   Aphasia    Problem List There are no problems to display for this patient.  [name], MS/CCC- SLP  [name] 05/27/2019, 3:55 PM  [place] MAIN [facility] SERVICES [address] [place], [place], [address] Phone: [phone]   Fax:  [phone]   Name: Peter Moore MRN: [id number] Date of Birth: 1940/11/01

## 2019-06-01 ENCOUNTER — Ambulatory Visit: Payer: Medicare HMO | Admitting: Speech Pathology

## 2019-06-01 ENCOUNTER — Other Ambulatory Visit: Payer: Self-pay

## 2019-06-01 DIAGNOSIS — R4701 Aphasia: Secondary | ICD-10-CM | POA: Diagnosis not present

## 2019-06-02 ENCOUNTER — Encounter: Payer: Self-pay | Admitting: Speech Pathology

## 2019-06-02 NOTE — Therapy (Signed)
[place] MAIN [facility] SERVICES [address] [place], [place], [address] Phone: [phone]   Fax:  [phone]  Speech Language Pathology Treatment  Patient Details  Name: Kamsiyochukwu Spickler MRN: [id number] Date of Birth: 01-10-1941 Referring Provider (SLP): Dr. Manuella Ghazi   Encounter Date: 06/01/2019  End of Session - 06/02/19 0946    Visit Number  21    Number of Visits  25    Date for SLP Re-Evaluation  06/11/19    Authorization Type  Medicare    Authorization Time Period  06/01/2019    Authorization - Visit Number  1    Authorization - Number of Visits  10    SLP Start Time  1500    SLP Stop Time   1550    SLP Time Calculation (min)  50 min    Activity Tolerance  Patient tolerated treatment well       Past Medical History:  Diagnosis Date  . Coronary artery disease   . Diabetes mellitus without complication ([place])   . Hypertension   . Myocardial infarction [facility])     Past Surgical History:  Procedure Laterality Date  . CARDIAC SURGERY    . TONSILLECTOMY      There were no vitals filed for this visit.  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  He received inpatient rehab at [facility], several outpatient sessions at [facility], and home health therapy (not clear when this ended).       Treatment Provided   Treatment provided  Cognitive-Linquistic      Pain Assessment   Pain Assessment  No/denies pain      Cognitive-Linquistic Treatment   Treatment focused on  Aphasia    Skilled Treatment  Patient and spouse were instructed in strategies for resolving communication breakdown, specifically using semantic feature analysis as a guide. Patient and spouse wrote some  goals for the session; patient wants to be able to "say it all" and his wife wants to "have a conversation with him". Patient and wife were given a Corporate investment banker" booklet with categories for visual alternative communication to be used outside of therapy.  Patient was 40% accurate in comprehension of semantic clues to guess a single word, and 100% accurate given semantic and phonemic cues. Patient was able to describe items to his wife with 60% accuracy using semantic clues.      Assessment / Recommendations / Plan   Plan  Continue with current plan of care      Progression Toward Goals   Progression toward goals  Progressing toward goals       SLP Education - 06/02/19 0946    Education Details  word finding strategies    Person(s) Educated  Patient;Spouse    Methods  Explanation    Comprehension  Verbalized understanding         SLP Long Term Goals - 05/27/19 1553      SLP LONG TERM GOAL #1   Title  Patient will complete semantic feature word finding tasks with 80% accuracy.    Status  Partially Met    Target Date  06/11/19  SLP LONG TERM GOAL #2   Title  Patient will generate grammatical and cogent sentence to complete simple/concrete linguistic task with 80% accuracy.    Period  Weeks    Status  Partially Met    Target Date  06/11/19      SLP LONG TERM GOAL #3   Title  Patient will complete 2 unit processing tasks with 80% accuracy without the need of repetition of task instructions or significant delays in responding.    Status  Partially Met    Target Date  06/11/19      SLP LONG TERM GOAL #4   Title  Patient will demonstrate reading comprehension for sentences with 80% accuracy.    Status  Partially Met    Target Date  06/11/19       Plan - 06/02/19 0947    Clinical Impression Statement  Patient utilized several self-cueing strategies; creating a semantic chain, tapping on the table, and using pantomime to self-cue words he could not produce.    Speech  Therapy Frequency  2x / week    Duration  Other (comment)    Treatment/Interventions  Multimodal communcation approach;Language facilitation;SLP instruction and feedback;Patient/family education;Cueing hierarchy;Compensatory techniques    Potential to Achieve Goals  Good    Potential Considerations  Ability to learn/carryover information;Pain level;Family/community support;Co-morbidities;Previous level of function;Cooperation/participation level;Severity of impairments    SLP Home Exercise Plan  Provided    Consulted and Agree with Plan of Care  Patient;Family member/caregiver    Family Member Consulted  [name])       Patient will benefit from skilled therapeutic intervention in order to improve the following deficits and impairments:   Aphasia    Problem List There are no problems to display for this patient.  [name], MS/CCC- SLP  [name] 06/02/2019, 9:48 AM  [place] MAIN [facility] SERVICES [address] [place], [place], [address] Phone: [phone]   Fax:  [phone]   Name: Daqwan Dougal MRN: [id number] Date of Birth: 03/29/1941

## 2019-06-03 ENCOUNTER — Other Ambulatory Visit: Payer: Self-pay

## 2019-06-03 ENCOUNTER — Ambulatory Visit: Payer: Medicare HMO | Admitting: Speech Pathology

## 2019-06-03 DIAGNOSIS — R4701 Aphasia: Secondary | ICD-10-CM

## 2019-06-04 ENCOUNTER — Encounter: Payer: Self-pay | Admitting: Speech Pathology

## 2019-06-04 NOTE — Therapy (Signed)
[place] MAIN [facility] SERVICES [address] [place], [place], [address] Phone: [phone]   Fax:  [phone]  Speech Language Pathology Treatment  Patient Details  Name: Peter Moore MRN: [id number] Date of Birth: [birth year] Referring Provider (SLP): Dr. Manuella Ghazi   Encounter Date: 06/03/2019  End of Session - 06/04/19 1246    Visit Number  22    Number of Visits  25    Date for SLP Re-Evaluation  06/11/19    Authorization Type  Medicare    Authorization Time Period  06/01/2019    Authorization - Visit Number  2    Authorization - Number of Visits  10    SLP Start Time  1500    SLP Stop Time   1550    SLP Time Calculation (min)  50 min    Activity Tolerance  Patient tolerated treatment well       Past Medical History:  Diagnosis Date  . Coronary artery disease   . Diabetes mellitus without complication ([place])   . Hypertension   . Myocardial infarction [facility])     Past Surgical History:  Procedure Laterality Date  . CARDIAC SURGERY    . TONSILLECTOMY      There were no vitals filed for this visit.  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  He received inpatient rehab at [facility], several outpatient sessions at [facility], and home health therapy (not clear when this ended).       Treatment Provided   Treatment provided  Cognitive-Linquistic      Pain Assessment   Pain Assessment  No/denies pain      Cognitive-Linquistic Treatment   Treatment focused on  Aphasia    Skilled Treatment  Expressive Language: Using semantic feature analysis, patient was able to successfully describe an object (f=10) with 50% accuracy. Given moderate cues, patient was 80%  accurate. Some mild perseveration noted on task. Patient often named the object before describing it and required prompting to describe details. Receptive Language: Patient was 100% accurate on guessing items (f=10) as described by his wife. Patient benefitted significantly from semantic cues. Phonemic cueing was beneficial 50% of the time.       Assessment / Recommendations / Plan   Plan  Continue with current plan of care      Progression Toward Goals   Progression toward goals  Progressing toward goals       SLP Education - 06/04/19 1245    Education Details  word finding strategies    Person(s) Educated  Patient;Spouse    Methods  Explanation    Comprehension  Verbalized understanding         SLP Long Term Goals - 05/27/19 1553      SLP LONG TERM GOAL #1   Title  Patient will complete semantic feature word finding tasks with 80% accuracy.    Status  Partially Met    Target Date  06/11/19      SLP LONG TERM GOAL #2   Title  Patient will generate grammatical and cogent sentence to complete simple/concrete linguistic task with 80% accuracy.  Period  Weeks    Status  Partially Met    Target Date  06/11/19      SLP LONG TERM GOAL #3   Title  Patient will complete 2 unit processing tasks with 80% accuracy without the need of repetition of task instructions or significant delays in responding.    Status  Partially Met    Target Date  06/11/19      SLP LONG TERM GOAL #4   Title  Patient will demonstrate reading comprehension for sentences with 80% accuracy.    Status  Partially Met    Target Date  06/11/19       Plan - 06/04/19 1246    Clinical Impression Statement  Patient and spouse were debriefed and educated on strategies that they can both utilize to effectively communicate with one another. Wife was educated on strategies to aid patient in his communication. Wife shared that she is planning on making at-home boards to help patient communicate what he wants to eat.  Patient sent home with worksheets for semantic feature analysis.    Speech Therapy Frequency  2x / week    Duration  Other (comment)    Treatment/Interventions  Multimodal communcation approach;Language facilitation;SLP instruction and feedback;Patient/family education;Cueing hierarchy;Compensatory techniques    Potential to Achieve Goals  Good    Potential Considerations  Ability to learn/carryover information;Pain level;Family/community support;Co-morbidities;Previous level of function;Cooperation/participation level;Severity of impairments    SLP Home Exercise Plan  Provided    Consulted and Agree with Plan of Care  Patient;Family member/caregiver    Family Member Consulted  Wife Peter Moore)       Patient will benefit from skilled therapeutic intervention in order to improve the following deficits and impairments:   Aphasia    Problem List There are no problems to display for this patient.  Peter Moore, Maddock, Susie 06/04/2019, 12:47 PM  [place] MAIN [facility] SERVICES [address] [place], [place], [address] Phone: [phone]   Fax:  [phone]   Name: Peter Moore MRN: [id number] Date of Birth: 11-12-1940

## 2019-06-05 ENCOUNTER — Encounter: Payer: Medicare HMO | Admitting: Speech Pathology

## 2019-06-10 ENCOUNTER — Other Ambulatory Visit: Payer: Self-pay

## 2019-06-10 ENCOUNTER — Encounter: Payer: Self-pay | Admitting: Speech Pathology

## 2019-06-10 ENCOUNTER — Ambulatory Visit: Payer: Medicare HMO | Admitting: Speech Pathology

## 2019-06-10 DIAGNOSIS — R4701 Aphasia: Secondary | ICD-10-CM

## 2019-06-10 NOTE — Therapy (Signed)
[place] MAIN [facility] SERVICES [address] [place], [place], [address] Phone: [phone]   Fax:  [phone]  Speech Language Pathology Treatment  Patient Details  Name: Peter Moore MRN: [id number] Date of Birth: [birth year] Referring Provider (SLP): Dr. Manuella Ghazi   Encounter Date: 06/10/2019  End of Session - 06/10/19 1506    Visit Number  23    Number of Visits  25    Date for SLP Re-Evaluation  06/11/19    Authorization Type  Medicare    Authorization Time Period  06/01/2019    Authorization - Visit Number  3    Authorization - Number of Visits  10    SLP Start Time  1100    SLP Stop Time   1200    SLP Time Calculation (min)  60 min    Activity Tolerance  Patient tolerated treatment well       Past Medical History:  Diagnosis Date  . Coronary artery disease   . Diabetes mellitus without complication ([place])   . Hypertension   . Myocardial infarction [facility])     Past Surgical History:  Procedure Laterality Date  . CARDIAC SURGERY    . TONSILLECTOMY      There were no vitals filed for this visit.  Subjective Assessment - 06/10/19 1505    Subjective  Patient was engaged but frustrated throughout the session. 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  He received inpatient rehab at [facility], several outpatient sessions at [facility], and home health therapy (not clear when this ended).       Treatment Provided   Treatment provided  Cognitive-Linquistic      Pain Assessment   Pain Assessment  No/denies pain      Cognitive-Linquistic Treatment   Treatment focused on  Aphasia    Skilled Treatment  Patient and spouse were educated on how to use semantic feature analysis to describe words and given outlines to write various semantic  information for a given object. Patient along with clinician and spouse filled out 3 worksheets on semantic feature analysis (key, pan, soap). Patient's spouse was educated on the importance of using strategies to help the patient speak while not speaking for him. Patient was able to create 40 semantically related utterances re: 3 different objects. Patient was independently accurate on 50% of semantically related utterances. Given semantic cueing, patient increased to 85% accuracy on utterances. Patient needed phonemic cueing on 1 item and a visual drawing on 1 item. Given clinician modeling and gesturing in isolation, patient was unable to generate the target word. When the patient was asked to gesture or pantomime what the item does/the use, he was able to pantomime its use but unable to generate the word.       Assessment / Recommendations / Plan   Plan  Continue with current plan of care      Progression Toward Goals   Progression toward goals  Progressing toward goals       SLP Education - 06/10/19 1506    Education Details  word finding strategies    Person(s) Educated  Patient;Spouse    Methods  Explanation    Comprehension  Verbalized understanding  SLP Long Term Goals - 05/27/19 1553      SLP LONG TERM GOAL #1   Title  Patient will complete semantic feature word finding tasks with 80% accuracy.    Status  Partially Met    Target Date  06/11/19      SLP LONG TERM GOAL #2   Title  Patient will generate grammatical and cogent sentence to complete simple/concrete linguistic task with 80% accuracy.    Period  Weeks    Status  Partially Met    Target Date  06/11/19      SLP LONG TERM GOAL #3   Title  Patient will complete 2 unit processing tasks with 80% accuracy without the need of repetition of task instructions or significant delays in responding.    Status  Partially Met    Target Date  06/11/19      SLP LONG TERM GOAL #4   Title  Patient will demonstrate reading  comprehension for sentences with 80% accuracy.    Status  Partially Met    Target Date  06/11/19       Plan - 06/10/19 1507    Clinical Impression Statement  Patient continues to use chaining to attempt to get to the word he wants to use, but often gets too frustrated to continue the task to completion. Patient benefits from semantic cues, specifically sentence completion, visual cues (i.e. drawings) and association. Patient does not benefit as much from phonemic cues or gestures.    Speech Therapy Frequency  2x / week    Duration  Other (comment)    Treatment/Interventions  Multimodal communcation approach;Language facilitation;SLP instruction and feedback;Patient/family education;Cueing hierarchy;Compensatory techniques    Potential to Achieve Goals  Good    Potential Considerations  Ability to learn/carryover information;Pain level;Family/community support;Co-morbidities;Previous level of function;Cooperation/participation level;Severity of impairments    SLP Home Exercise Plan  Provided    Consulted and Agree with Plan of Care  Patient;Family member/caregiver    Family Member Consulted  [name])       Patient will benefit from skilled therapeutic intervention in order to improve the following deficits and impairments:   Aphasia    Problem List There are no problems to display for this patient.  [name], MS/CCC- SLP  [name] 06/10/2019, 3:07 PM  [place] MAIN [facility] SERVICES [address] [place], [place], [address] Phone: [phone]   Fax:  [phone]   Name: Peter Moore MRN: [id number] Date of Birth: 22-Apr-1941

## 2019-06-12 ENCOUNTER — Other Ambulatory Visit: Payer: Self-pay

## 2019-06-12 ENCOUNTER — Encounter: Payer: Self-pay | Admitting: Speech Pathology

## 2019-06-12 ENCOUNTER — Ambulatory Visit: Payer: Medicare HMO | Admitting: Speech Pathology

## 2019-06-12 DIAGNOSIS — R4701 Aphasia: Secondary | ICD-10-CM | POA: Diagnosis not present

## 2019-06-12 NOTE — Therapy (Signed)
[place] MAIN [facility] SERVICES [address] [place], [place], [address] Phone: [phone]   Fax:  [phone]  Speech Language Pathology Treatment/Re-Certification  Patient Details  Name: Peter Moore MRN: [id number] Date of Birth: 08-07-1940 Referring Provider (SLP): Dr. Manuella Ghazi   Encounter Date: 06/12/2019  End of Session - 06/12/19 1231    Visit Number  24    Number of Visits  40    Date for SLP Re-Evaluation  08/07/19    Authorization Type  Medicare    Authorization Time Period  06/01/2019    Authorization - Visit Number  4    Authorization - Number of Visits  10    SLP Start Time  0900    SLP Stop Time   1000    SLP Time Calculation (min)  60 min    Activity Tolerance  Patient tolerated treatment well       Past Medical History:  Diagnosis Date  . Coronary artery disease   . Diabetes mellitus without complication ([place])   . Hypertension   . Myocardial infarction [facility])     Past Surgical History:  Procedure Laterality Date  . CARDIAC SURGERY    . TONSILLECTOMY      There were no vitals filed for this visit.  Subjective Assessment - 06/12/19 1230    Subjective  Patient was engaged but frustrated throughout the session.    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  He received inpatient rehab at [facility], several outpatient sessions at [facility], and home health therapy (not clear when this ended).       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 Patient presented this morning with severe  difficulty producing related words to the topic. Given a visual cue and a written cue, patient was able to produce 8/16 (50%)  words semantically related to [place]. Given maximal semantic cues and visual/gestural cues, patient produced 16/16  (100%) target utterances. On the [employer] map, patient produced 8/14 (60%) phrases. Given semantic cues, patient produced 14/14 (100%)  phrases. Patient and wife were educated on the importance of Tax inspector.       Assessment / Recommendations / Plan   Plan  Continue with current plan of care      Progression Toward Goals   Progression toward goals  Progressing toward goals       SLP Education - 06/12/19 1231    Education Details  Communication Partner Training    Person(s) Educated  Patient    Methods  Explanation    Comprehension  Verbalized understanding         SLP Long Term Goals - 06/12/19 1234      SLP LONG TERM GOAL #1   Title  Patient will complete semantic feature word finding tasks with 80% accuracy.    Time  8    Period  Weeks    Status  Partially Met    Target Date  08/07/19      SLP LONG TERM GOAL #2   Title  Patient will generate grammatical and cogent sentence to complete simple/concrete linguistic task with 80% accuracy.    Time  8    Period  Weeks    Status  Partially Met    Target Date  08/07/19      SLP LONG TERM GOAL #3   Title  Patient will complete 2 unit processing tasks with 80% accuracy without the need of repetition of task instructions or significant delays in responding.    Time  8    Period  Weeks    Status  Partially Met    Target Date  08/07/19      SLP LONG TERM GOAL #4   Title  Patient will demonstrate reading comprehension for sentences with 80% accuracy.    Time  8    Period  Weeks    Status  Partially Met    Target Date  08/07/19       Plan - 06/12/19 1233    Clinical Impression Statement  Patient continues to present with significant perseveration. On  occasion patient demonstrated semantic stringing, for example, four of the eight originally produced words were variations of the word "shore": [place], [place], [place], [employer]. Patient gets extremely frustrated by word-finding difficulty. Patient tends to get stuck on one word and when he cannot produce it, he claims he cannot produce any related words. Patient's wife continues to demonstrate excellent cueing strategies, including gesturing, semantic cueing, and phonemic cueing. Patient continues to respond best to semantic cueing.  Will continue with current plan of care. Focus next week on simple, concrete objects. Educate patients on the importance of carry-over and practicing this at home.    Speech Therapy Frequency  2x / week    Duration  Other (comment)    Treatment/Interventions  Multimodal communcation approach;Language facilitation;SLP instruction and feedback;Patient/family education;Cueing hierarchy;Compensatory techniques    Potential to Achieve Goals  Good    Potential Considerations  Ability to learn/carryover information;Pain level;Family/community support;Co-morbidities;Previous level of function;Cooperation/participation level;Severity of impairments    SLP Home Exercise Plan  Provided    Consulted and Agree with Plan of Care  Patient;Family member/caregiver    Family Member Consulted  [name])       Patient will benefit from skilled therapeutic intervention in order to improve the following deficits and impairments:   Aphasia - Plan: SLP plan of care cert/re-cert    Problem List There are no problems to display for this patient.  [name], MS/CCC- SLP  [name] 06/12/2019, 12:37 PM  [place] MAIN [facility] SERVICES [address] [place], [place], [address] Phone: [phone]   Fax:  [phone]   Name: Peter Moore MRN: [id number] Date of Birth: 1941-01-05

## 2019-06-16 ENCOUNTER — Ambulatory Visit: Payer: Medicare HMO | Attending: Neurology | Admitting: Speech Pathology

## 2019-06-16 ENCOUNTER — Encounter: Payer: Self-pay | Admitting: Speech Pathology

## 2019-06-16 ENCOUNTER — Other Ambulatory Visit: Payer: Self-pay

## 2019-06-16 DIAGNOSIS — R4701 Aphasia: Secondary | ICD-10-CM | POA: Diagnosis present

## 2019-06-16 NOTE — Therapy (Signed)
[place] MAIN [facility] SERVICES [address] [place], [place], [address] Phone: [phone]   Fax:  [phone]  Speech Language Pathology Treatment  Patient Details  Name: Peter Moore MRN: [id number] Date of Birth: Mar 08, 1941 Referring Provider (SLP): Dr. Manuella Ghazi   Encounter Date: 06/16/2019  End of Session - 06/16/19 1316    Visit Number  25    Number of Visits  40    Date for SLP Re-Evaluation  08/07/19    Authorization Type  Medicare    Authorization Time Period  06/01/2019    Authorization - Visit Number  5    Authorization - Number of Visits  10    SLP Start Time  1000    SLP Stop Time   1050    SLP Time Calculation (min)  50 min    Activity Tolerance  Patient tolerated treatment well       Past Medical History:  Diagnosis Date  . Coronary artery disease   . Diabetes mellitus without complication ([place])   . Hypertension   . Myocardial infarction [facility])     Past Surgical History:  Procedure Laterality Date  . CARDIAC SURGERY    . TONSILLECTOMY      There were no vitals filed for this visit.  Subjective Assessment - 06/16/19 1315    Subjective  Patient continues to do his best despite frustration.    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  He received inpatient rehab at [facility], several outpatient sessions at [facility], and home health therapy (not clear when this ended).       Cognitive-Linquistic Treatment   Treatment focused on  Aphasia    Skilled Treatment  Patient was educated on the relationship between emotion and language and instructed on methods for managing frustration. Given 10 concrete objects and asked to communicate thier use/purpose with gestures and pantomime, patient was 90% accurate. On [employer] Analysis task, when  given single concrete object, patient independently gave 12 utterances (70% of total utterances). Given semantic and phonemic cueing, patient was 100% accurate in verbal semantic feature analysis. Patient's wife was encouraged to participate in the session with methods from Tax inspector.      Assessment / Recommendations / Plan   Plan  Continue with current plan of care      Progression Toward Goals   Progression toward goals  Progressing toward goals       SLP Education - 06/16/19 1316    Education Details  Tax inspector and emotion impact on language    Person(s) Educated  Patient;Spouse    Methods  Explanation    Comprehension  Verbalized understanding         SLP Long Term Goals - 06/12/19 1234      SLP LONG TERM GOAL #1   Title  Patient will complete semantic feature word finding tasks with 80% accuracy.    Time  8    Period  Weeks    Status  Partially Met    Target Date  08/07/19      SLP LONG TERM GOAL #2   Title  Patient will generate grammatical and cogent sentence to complete simple/concrete linguistic task with 80% accuracy.  Time  8    Period  Weeks    Status  Partially Met    Target Date  08/07/19      SLP LONG TERM GOAL #3   Title  Patient will complete 2 unit processing tasks with 80% accuracy without the need of repetition of task instructions or significant delays in responding.    Time  8    Period  Weeks    Status  Partially Met    Target Date  08/07/19      SLP LONG TERM GOAL #4   Title  Patient will demonstrate reading comprehension for sentences with 80% accuracy.    Time  8    Period  Weeks    Status  Partially Met    Target Date  08/07/19       Plan - 06/16/19 1318    Clinical Impression Statement  Patient continues feel frustrated when he has word-finding difficulty, however, he was able to independently create 70% of utterances during this session as compared to 50% in previous sessions. Patient  demonstrates an increase in independence when trying to complete a task. Patient continues to mainly benefit from semantic cues but also did show some improvement in multi-modal cue interpretation/usage.Perseveration noted across tasks.    Speech Therapy Frequency  2x / week    Duration  Other (comment)    Treatment/Interventions  Multimodal communcation approach;Language facilitation;SLP instruction and feedback;Patient/family education;Cueing hierarchy;Compensatory techniques    Potential to Achieve Goals  Good    Potential Considerations  Ability to learn/carryover information;Pain level;Family/community support;Co-morbidities;Previous level of function;Cooperation/participation level;Severity of impairments    SLP Home Exercise Plan  Provided    Consulted and Agree with Plan of Care  Patient;Family member/caregiver    Family Member Consulted  [name])       Patient will benefit from skilled therapeutic intervention in order to improve the following deficits and impairments:   Aphasia    Problem List There are no problems to display for this patient.   Stephanye Finnicum 06/16/2019, 1:19 PM  [place] MAIN [facility] SERVICES [address] [place], [place], [address] Phone: [phone]   Fax:  [phone]   Name: Peter Moore MRN: [id number] Date of Birth: 10-05-1940

## 2019-06-18 ENCOUNTER — Ambulatory Visit: Payer: Medicare HMO | Admitting: Speech Pathology

## 2019-06-18 ENCOUNTER — Encounter: Payer: Self-pay | Admitting: Speech Pathology

## 2019-06-18 ENCOUNTER — Other Ambulatory Visit: Payer: Self-pay

## 2019-06-18 DIAGNOSIS — R4701 Aphasia: Secondary | ICD-10-CM | POA: Diagnosis not present

## 2019-06-18 NOTE — Therapy (Signed)
[place] MAIN [facility] SERVICES [address] [place], [place], [address] Phone: [phone]   Fax:  [phone]  Speech Language Pathology Treatment  Patient Details  Name: Peter Moore MRN: [id number] Date of Birth: [birth year] Referring Provider (SLP): Dr. Manuella Ghazi   Encounter Date: 06/18/2019  End of Session - 06/18/19 1204    Visit Number  26    Number of Visits  40    Date for SLP Re-Evaluation  08/07/19    Authorization Type  Medicare    Authorization Time Period  06/01/2019    Authorization - Visit Number  6    Authorization - Number of Visits  10    SLP Start Time  1000    SLP Stop Time   1050    SLP Time Calculation (min)  50 min    Activity Tolerance  Patient tolerated treatment well       Past Medical History:  Diagnosis Date  . Coronary artery disease   . Diabetes mellitus without complication ([place])   . Hypertension   . Myocardial infarction [facility])     Past Surgical History:  Procedure Laterality Date  . CARDIAC SURGERY    . TONSILLECTOMY      There were no vitals filed for this visit.  Subjective Assessment - 06/18/19 1203    Subjective  Patient began in good spirits, became frustrated near the end of the task.    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  He received inpatient rehab at [facility], several outpatient sessions at [facility], and home health therapy (not clear when this ended).       Cognitive-Linquistic Treatment   Treatment focused on  Aphasia    Skilled Treatment  Patient was asked to read an object and doodle it, then have the clinician + wife guess. Patient independently verbalized semantically related words to help aid in guessing. For example: patient said "tooth" for target toothbrush, "box", "big", "bedspread", "not just  one-dimensional" for target king-sized mattress, "bells" for target Christmas tree. Patient was able to verbalize "talk", "book", "wallet", "tail" and "lighthouse" without additional cues from the clinician when presented with illustrations. Given additional visual and semantic cues, patient was able to tell a popular children's story of the [employer] and the [place] Pigs. When patient forgot the name of the big bad wolf, he called it "gray, brown, big dog"       Assessment / Recommendations / [place] with current plan of care      Progression Toward Goals   Progression toward goals  Progressing toward goals       SLP Education - 06/18/19 1204    Education Details  multimodal communication    Person(s) Educated  Patient;Spouse    Methods  Explanation    Comprehension  Verbalized understanding         SLP Long Term Goals - 06/12/19 1234      SLP LONG TERM GOAL #1   Title  Patient will complete semantic feature word finding tasks with 80% accuracy.    Time  8    Period  Weeks    Status  Partially Met    Target Date  08/07/19  SLP LONG TERM GOAL #2   Title  Patient will generate grammatical and cogent sentence to complete simple/concrete linguistic task with 80% accuracy.    Time  8    Period  Weeks    Status  Partially Met    Target Date  08/07/19      SLP LONG TERM GOAL #3   Title  Patient will complete 2 unit processing tasks with 80% accuracy without the need of repetition of task instructions or significant delays in responding.    Time  8    Period  Weeks    Status  Partially Met    Target Date  08/07/19      SLP LONG TERM GOAL #4   Title  Patient will demonstrate reading comprehension for sentences with 80% accuracy.    Time  8    Period  Weeks    Status  Partially Met    Target Date  08/07/19       Plan - 06/18/19 1205    Clinical Impression Statement  Patient started out very encouraged but got discouraged when he could not remember  Semantic Feature Analysis from previous session. Patient gets frustrated/confused by written words to try to alleviate communication breakdown and benefits from semantic, phonemic, gestural, and visual cues. Patient demonstrated improvement in semantic feature analysis generalization and was less concerned with perfectly correct answers.    Speech Therapy Frequency  2x / week    Duration  Other (comment)    Treatment/Interventions  Multimodal communcation approach;Language facilitation;SLP instruction and feedback;Patient/family education;Cueing hierarchy;Compensatory techniques    Potential to Achieve Goals  Good    Potential Considerations  Ability to learn/carryover information;Pain level;Family/community support;Co-morbidities;Previous level of function;Cooperation/participation level;Severity of impairments    SLP Home Exercise Plan  Provided    Consulted and Agree with Plan of Care  Patient;Family member/caregiver    Family Member Consulted  [name])       Patient will benefit from skilled therapeutic intervention in order to improve the following deficits and impairments:   Aphasia    Problem List There are no problems to display for this patient.   Peter Moore 06/18/2019, 12:06 PM  [place] MAIN [facility] SERVICES [address] [place], [place], [address] Phone: [phone]   Fax:  [phone]   Name: Peter Moore MRN: [id number] Date of Birth: July 23, 1940

## 2019-06-25 ENCOUNTER — Encounter: Payer: Self-pay | Admitting: Speech Pathology

## 2019-06-25 ENCOUNTER — Ambulatory Visit: Payer: Medicare HMO | Admitting: Speech Pathology

## 2019-06-25 ENCOUNTER — Other Ambulatory Visit: Payer: Self-pay

## 2019-06-25 DIAGNOSIS — R4701 Aphasia: Secondary | ICD-10-CM | POA: Diagnosis not present

## 2019-06-25 NOTE — Therapy (Signed)
[place] MAIN [facility] SERVICES [address] [place], [place], [address] Phone: [phone]   Fax:  [phone]  Speech Language Pathology Treatment  Patient Details  Name: Peter Moore MRN: [id number] Date of Birth: 11-26-1940 Referring Provider (SLP): Dr. Manuella Ghazi   Encounter Date: 06/25/2019  End of Session - 06/25/19 1324    Visit Number  27    Number of Visits  40    Date for SLP Re-Evaluation  08/07/19    Authorization Type  Medicare    Authorization Time Period  06/01/2019    Authorization - Visit Number  7    Authorization - Number of Visits  10    SLP Start Time  1000    SLP Stop Time   1050    SLP Time Calculation (min)  50 min    Activity Tolerance  Patient tolerated treatment well       Past Medical History:  Diagnosis Date  . Coronary artery disease   . Diabetes mellitus without complication ([place])   . Hypertension   . Myocardial infarction [facility])     Past Surgical History:  Procedure Laterality Date  . CARDIAC SURGERY    . TONSILLECTOMY      There were no vitals filed for this visit.  Subjective Assessment - 06/25/19 1323    Subjective  Patient became very furstrated today. "Something just isn't right."    Currently in Pain?  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  He received inpatient rehab at [facility], several outpatient sessions at [facility], and home health therapy (not clear when this ended).       Treatment Provided   Treatment provided  Cognitive-Linquistic      Pain Assessment   Pain Assessment  No/denies pain      Cognitive-Linquistic Treatment   Treatment focused on  Aphasia    Skilled Treatment Patient began the session with a description of playing wordcross games with his brother, using gestures and multimodal cues. Patient continues to become very  frustrated with word-finding difficulty. Patient was prompted to draw photos and was able to come up with 5 semantically related items for each target word. Patient was prompted to play a game of hangman and was able to get both target words given visual and written cues. Perseveration noted on task. Patient was then prompted to engage in a spontaneous speech task, and demonstrated excellent conversational skills.     Assessment / Recommendations / Plan   Plan  Continue with current plan of care      Progression Toward Goals   Progression toward goals  Progressing toward goals       SLP Education - 06/25/19 1324    Education Details  multimodal communication    Person(s) Educated  Patient    Methods  Explanation    Comprehension  Verbalized understanding         SLP Long Term Goals - 06/12/19 1234      SLP LONG TERM GOAL #1   Title  Patient will complete semantic feature word finding tasks with 80% accuracy.    Time  8    Period  Weeks    Status  Partially Met    Target Date  08/07/19  SLP LONG TERM GOAL #2   Title  Patient will generate grammatical and cogent sentence to complete simple/concrete linguistic task with 80% accuracy.    Time  8    Period  Weeks    Status  Partially Met    Target Date  08/07/19      SLP LONG TERM GOAL #3   Title  Patient will complete 2 unit processing tasks with 80% accuracy without the need of repetition of task instructions or significant delays in responding.    Time  8    Period  Weeks    Status  Partially Met    Target Date  08/07/19      SLP LONG TERM GOAL #4   Title  Patient will demonstrate reading comprehension for sentences with 80% accuracy.    Time  8    Period  Weeks    Status  Partially Met    Target Date  08/07/19       Plan - 06/25/19 1325    Clinical Impression Statement  Patient became very agitated with word-finding tasks today. Demonstrated excellent word-finding with semantically related words, and on one  occasion was noted to generate a list of words that all started with "P" while trying to say "pumpkin". Patient's level of frustration is exceedingly causing a negative impact on his speech. Patient is generalizing strategies utilized in therapy but still feels that he is not progressing. Patient may benefit from structured speech tasks without charts, visuals, etc., as he seemed to enjoy this task better than previous tasks attempted during the session.    Speech Therapy Frequency  2x / week    Duration  Other (comment)    Treatment/Interventions  Multimodal communcation approach;Language facilitation;SLP instruction and feedback;Patient/family education;Cueing hierarchy;Compensatory techniques    Potential to Achieve Goals  Good    Potential Considerations  Ability to learn/carryover information;Pain level;Family/community support;Co-morbidities;Previous level of function;Cooperation/participation level;Severity of impairments    SLP Home Exercise Plan  Provided    Consulted and Agree with Plan of Care  Patient;Family member/caregiver    Family Member Consulted  Wife [place] Moore)       Patient will benefit from skilled therapeutic intervention in order to improve the following deficits and impairments:   Aphasia    Problem List There are no problems to display for this patient.   Peter Moore 06/25/2019, 1:25 PM  [place] MAIN [facility] SERVICES [address] [place], [place], [address] Phone: [phone]   Fax:  [phone]   Name: Peter Moore MRN: [id number] Date of Birth: Oct 21, 1940

## 2019-06-29 ENCOUNTER — Ambulatory Visit: Payer: Medicare HMO | Admitting: Speech Pathology

## 2019-06-29 ENCOUNTER — Other Ambulatory Visit: Payer: Self-pay

## 2019-06-29 DIAGNOSIS — R4701 Aphasia: Secondary | ICD-10-CM | POA: Diagnosis not present

## 2019-06-30 ENCOUNTER — Encounter: Payer: Self-pay | Admitting: Speech Pathology

## 2019-06-30 NOTE — Therapy (Signed)
[place] MAIN [facility] SERVICES [address] [place], [place], [address] Phone: [phone]   Fax:  [phone]  Speech Language Pathology Treatment  Patient Details  Name: Peter Moore MRN: [id number] Date of Birth: 1940-11-25 Referring Provider (SLP): Dr. Manuella Ghazi   Encounter Date: 06/29/2019  End of Session - 06/30/19 1120    Visit Number  28    Number of Visits  40    Date for SLP Re-Evaluation  08/07/19    Authorization Type  Medicare    Authorization Time Period  06/01/2019    Authorization - Visit Number  8    Authorization - Number of Visits  10    SLP Start Time  1500    SLP Stop Time   1550    SLP Time Calculation (min)  50 min    Activity Tolerance  Patient tolerated treatment well       Past Medical History:  Diagnosis Date  . Coronary artery disease   . Diabetes mellitus without complication ([place])   . Hypertension   . Myocardial infarction [facility])     Past Surgical History:  Procedure Laterality Date  . CARDIAC SURGERY    . TONSILLECTOMY      There were no vitals filed for this visit.  Subjective Assessment - 06/30/19 1119    Subjective  Patient is easily frustrated    Patient is accompained by:  Family member              SLP Education - 06/30/19 1119    Education Details  resist frustration- which will make word finding harder    Person(s) Educated  Patient    Methods  Explanation    Comprehension  Verbalized understanding         SLP Long Term Goals - 06/12/19 1234      SLP LONG TERM GOAL #1   Title  Patient will complete semantic feature word finding tasks with 80% accuracy.    Time  8    Period  Weeks    Status  Partially Met    Target Date  08/07/19      SLP LONG TERM GOAL #2   Title  Patient will generate grammatical and cogent sentence to complete simple/concrete linguistic task with 80% accuracy.    Time  8    Period  Weeks    Status  Partially Met    Target Date  08/07/19      SLP LONG TERM GOAL #3   Title  Patient will complete 2 unit processing tasks with 80% accuracy without the need of repetition of task instructions or significant delays in responding.    Time  8    Period  Weeks    Status  Partially Met    Target Date  08/07/19      SLP LONG TERM GOAL #4   Title  Patient will demonstrate reading comprehension for sentences with 80% accuracy.    Time  8    Period  Weeks    Status  Partially Met    Target Date  08/07/19       Plan - 06/30/19 1121    Clinical Impression Statement  Patient continues to be easily frustrated despite improved functional communication.    Speech Therapy Frequency  2x / week    Duration  Other (comment)    Treatment/Interventions  Multimodal communcation approach;Language facilitation;SLP instruction and feedback;Patient/family education;Cueing hierarchy;Compensatory techniques    Potential to Achieve Goals  Good  Potential Considerations  Ability to learn/carryover information;Pain level;Family/community support;Co-morbidities;Previous level of function;Cooperation/participation level;Severity of impairments    Consulted and Agree with Plan of Care  Patient;Family member/caregiver    Family Member Consulted  Wife [place])       Patient will benefit from skilled therapeutic intervention in order to improve the following deficits and impairments:   Aphasia    Problem List There are no problems to display for this patient.  [name], MS/CCC- SLP  [name] 06/30/2019, 11:24 AM  [place] MAIN [facility] SERVICES [address] [place], [place], [address] Phone: [phone]   Fax:  [phone]   Name: Peter [place] MRN: [id number] Date of Birth: 30-Jan-1941

## 2019-07-01 ENCOUNTER — Other Ambulatory Visit: Payer: Self-pay

## 2019-07-01 ENCOUNTER — Ambulatory Visit: Payer: Medicare HMO | Admitting: Speech Pathology

## 2019-07-01 ENCOUNTER — Encounter: Payer: Self-pay | Admitting: Speech Pathology

## 2019-07-01 DIAGNOSIS — R4701 Aphasia: Secondary | ICD-10-CM | POA: Diagnosis not present

## 2019-07-01 NOTE — Therapy (Signed)
[place] MAIN [facility] SERVICES [address] [place], [place], [address] Phone: [phone]   Fax:  [phone]  Speech Language Pathology Treatment  Patient Details  Name: Peter Moore MRN: [id number] Date of Birth: [birth year] Referring Provider (SLP): Dr. Manuella Ghazi   Encounter Date: 07/01/2019  End of Session - 07/01/19 1611    Visit Number  29    Number of Visits  40    Date for SLP Re-Evaluation  08/07/19    Authorization Type  Medicare    Authorization Time Period  06/01/2019    Authorization - Visit Number  8    Authorization - Number of Visits  10    SLP Start Time  0200    SLP Stop Time   0250    SLP Time Calculation (min)  50 min    Activity Tolerance  Patient tolerated treatment well       Past Medical History:  Diagnosis Date  . Coronary artery disease   . Diabetes mellitus without complication ([place])   . Hypertension   . Myocardial infarction [facility])     Past Surgical History:  Procedure Laterality Date  . CARDIAC SURGERY    . TONSILLECTOMY      There were no vitals filed for this visit.  Subjective Assessment - 07/01/19 1610    Subjective  Patient was in good spirits today.    Patient is accompained by:  Family member    Currently in Pain?  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  He received inpatient rehab at [facility], several outpatient sessions at [facility], and home health therapy (not clear when this ended).       Treatment Provided   Treatment provided  Cognitive-Linquistic      Pain Assessment   Pain Assessment  No/denies pain      Cognitive-Linquistic Treatment   Treatment focused on  Aphasia    Skilled Treatment  Patient was prompted to answer some questions about his life, including former profession, favorite pastimes, his family, etc. Communication  Partner was prompted to intervene only when invited to resolve communication breakdown. Both patient and wife educated on the value of independence in speech. Patient effectively communicated 10/15 novel stories to  with no need for intervention from wife or clinician. On stories in which his communication was unclear, his wife was able to resolve using strategies as per discussed in previous sessions.     Assessment / Recommendations / Plan   Plan  Continue with current plan of care      Progression Toward Goals   Progression toward goals  Progressing toward goals       SLP Education - 07/01/19 1611    Education Details  resist frustration & communication partner training    Person(s) Educated  Patient;Spouse    Methods  Explanation    Comprehension  Verbalized understanding         SLP Long Term Goals - 06/12/19 1234      SLP LONG TERM GOAL #1   Title  Patient will complete semantic feature word finding tasks with 80% accuracy.    Time  8    Period  Weeks    Status  Partially Met    Target Date  08/07/19  SLP LONG TERM GOAL #2   Title  Patient will generate grammatical and cogent sentence to complete simple/concrete linguistic task with 80% accuracy.    Time  8    Period  Weeks    Status  Partially Met    Target Date  08/07/19      SLP LONG TERM GOAL #3   Title  Patient will complete 2 unit processing tasks with 80% accuracy without the need of repetition of task instructions or significant delays in responding.    Time  8    Period  Weeks    Status  Partially Met    Target Date  08/07/19      SLP LONG TERM GOAL #4   Title  Patient will demonstrate reading comprehension for sentences with 80% accuracy.    Time  8    Period  Weeks    Status  Partially Met    Target Date  08/07/19       Plan - 07/01/19 1612    Clinical Impression Statement Given topics that are motivating to the patient, he is much more cogent and clear in his communication. Patient  demonstrated less frustration than in previous sessions, and was able to word-find with more ease than previously noted. Patient has begun self-advocating and telling both wife/clinician to wait for him to come up with the word. He was able to independently word find on several words today. Discussed frustration that both patient and wife feel - wife wants to help, but patient craves independence. Patient confided that he feels frustrated that he is unable to help with taxes, reports that numbers are very confusing.   Speech Therapy Frequency  2x / week    Duration  Other (comment)    Treatment/Interventions  Multimodal communcation approach;Language facilitation;SLP instruction and feedback;Patient/family education;Cueing hierarchy;Compensatory techniques    Potential to Achieve Goals  Good    Potential Considerations  Ability to learn/carryover information;Pain level;Family/community support;Co-morbidities;Previous level of function;Cooperation/participation level;Severity of impairments    Consulted and Agree with Plan of Care  Patient;Family member/caregiver    Family Member Consulted  [name])       Patient will benefit from skilled therapeutic intervention in order to improve the following deficits and impairments:   Aphasia    Problem List There are no problems to display for this patient.   Ja Pistole 07/01/2019, 4:13 PM  [place] MAIN [facility] SERVICES [address] [place], [place], [address] Phone: [phone]   Fax:  [phone]   Name: Ugochukwu Chichester MRN: [id number] Date of Birth: [birth year]

## 2019-07-08 ENCOUNTER — Encounter: Payer: Self-pay | Admitting: Speech Pathology

## 2019-07-08 ENCOUNTER — Other Ambulatory Visit: Payer: Self-pay

## 2019-07-08 ENCOUNTER — Ambulatory Visit: Payer: Medicare HMO | Admitting: Speech Pathology

## 2019-07-08 DIAGNOSIS — R4701 Aphasia: Secondary | ICD-10-CM | POA: Diagnosis not present

## 2019-07-08 NOTE — Therapy (Signed)
[place] MAIN [facility] SERVICES [address] [place], [place], [address] Phone: [phone]   Fax:  [phone]  Speech Language Pathology Treatment  Patient Details  Name: Peter Moore MRN: [id number] Date of Birth: January 05, 1941 Referring Provider (SLP): Dr. Manuella Ghazi   Encounter Date: 07/08/2019  End of Session - 07/08/19 1517    Visit Number  30    Number of Visits  40    Date for SLP Re-Evaluation  08/07/19    Authorization Type  Medicare    Authorization Time Period  06/01/2019    Authorization - Visit Number  9    Authorization - Number of Visits  10    SLP Start Time  0200    SLP Stop Time   0250    SLP Time Calculation (min)  50 min    Activity Tolerance  Patient tolerated treatment well       Past Medical History:  Diagnosis Date  . Coronary artery disease   . Diabetes mellitus without complication ([place])   . Hypertension   . Myocardial infarction [facility])     Past Surgical History:  Procedure Laterality Date  . CARDIAC SURGERY    . TONSILLECTOMY      There were no vitals filed for this visit.  Subjective Assessment - 07/08/19 1516    Subjective  Patient was very frustrated today, wife exhausted.    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  He received inpatient rehab at [facility], several outpatient sessions at [facility], and home health therapy (not clear when this ended).       Treatment Provided   Treatment provided  Cognitive-Linquistic      Pain Assessment   Pain Assessment  No/denies pain      Cognitive-Linquistic Treatment   Treatment focused on  Aphasia    Skilled Treatment  Patient and wife were educated on Tax inspector and ways to implement multimodal communication into daily life, especially in functional  settings, such as ordering food at [employer]. Patient and wife were educated on the different types of multi-modal communication (I.e. drawing, writing, looking up pictures, pointing, gesturing, etc.) Wife sent home with a reading about functional communication partner training.     Assessment / Recommendations / Plan   Plan  Continue with current plan of care      Progression Toward Goals   Progression toward goals  Progressing toward goals       SLP Education - 07/08/19 1517    Education Details  communication partner training    Person(s) Educated  Patient    Methods  Explanation    Comprehension  Verbalized understanding         SLP Long Term Goals - 06/12/19 1234      SLP LONG TERM GOAL #1   Title  Patient will complete semantic feature word finding tasks with 80% accuracy.    Time  8    Period  Weeks    Status  Partially Met    Target Date  08/07/19      SLP LONG TERM GOAL #2   Title  Patient will generate grammatical and cogent sentence to complete simple/concrete linguistic task with 80% accuracy.    Time  8    Period  Weeks    Status  Partially Met    Target Date  08/07/19      SLP LONG TERM GOAL #3   Title  Patient will complete 2 unit processing tasks with 80% accuracy without the need of repetition of task instructions or significant delays in responding.    Time  8    Period  Weeks    Status  Partially Met    Target Date  08/07/19      SLP LONG TERM GOAL #4   Title  Patient will demonstrate reading comprehension for sentences with 80% accuracy.    Time  8    Period  Weeks    Status  Partially Met    Target Date  08/07/19       Plan - 07/08/19 1517    Clinical Impression Statement  Patient found humor in his mistakes today. He continues to feel frustrated over loss of independence and his difficulty with word-finding. Patient and wife report frustration over daily tasks and inability to effectively communicate in daily settings, i.e. getting food  from a restaurant, trying to find a place to go. Wife reports frustration with patients lack of progress.   Speech Therapy Frequency  2x / week    Duration  Other (comment)    Treatment/Interventions  Multimodal communcation approach;Language facilitation;SLP instruction and feedback;Patient/family education;Cueing hierarchy;Compensatory techniques    Potential to Achieve Goals  Good    Potential Considerations  Ability to learn/carryover information;Pain level;Family/community support;Co-morbidities;Previous level of function;Cooperation/participation level;Severity of impairments    Consulted and Agree with Plan of Care  Patient;Family member/caregiver    Family Member Consulted  [name])       Patient will benefit from skilled therapeutic intervention in order to improve the following deficits and impairments:   Aphasia    Problem List There are no problems to display for this patient.   Sherlonda Flater 07/08/2019, 3:19 PM  [place] MAIN [facility] SERVICES [address] [place], [place], [address] Phone: [phone]   Fax:  [phone]   Name: Peter Moore MRN: [id number] Date of Birth: 11-30-1940

## 2019-07-10 ENCOUNTER — Ambulatory Visit: Payer: Medicare HMO | Admitting: Speech Pathology

## 2019-07-10 ENCOUNTER — Other Ambulatory Visit: Payer: Self-pay

## 2019-07-10 ENCOUNTER — Encounter: Payer: Self-pay | Admitting: Speech Pathology

## 2019-07-10 DIAGNOSIS — R4701 Aphasia: Secondary | ICD-10-CM | POA: Diagnosis not present

## 2019-07-10 NOTE — Therapy (Signed)
[place] MAIN [facility] SERVICES [address] [place], [place], [address] Phone: [phone]   Fax:  [phone]  Speech Language Pathology Treatment  Speech Therapy Progress Note   Dates of reporting period  05/15/2019   to   07/10/2019   Patient Details  Name: Peter Moore MRN: [id number] Date of Birth: [birth year] Referring Provider (SLP): Dr. Manuella Ghazi   Encounter Date: 07/10/2019  End of Session - 07/10/19 1237    Visit Number  31    Number of Visits  40    Date for SLP Re-Evaluation  08/07/19    Authorization Type  Medicare    Authorization Time Period  06/01/2019    Authorization - Visit Number  10    Authorization - Number of Visits  10    SLP Start Time  1000    SLP Stop Time   1050    SLP Time Calculation (min)  50 min    Activity Tolerance  Patient tolerated treatment well       Past Medical History:  Diagnosis Date  . Coronary artery disease   . Diabetes mellitus without complication ([place])   . Hypertension   . Myocardial infarction [facility])     Past Surgical History:  Procedure Laterality Date  . CARDIAC SURGERY    . TONSILLECTOMY      There were no vitals filed for this visit.  Subjective Assessment - 07/10/19 1236    Subjective  Patient was highly motivated and engaged in the session/    Patient is accompained by:  Family member    Currently in Pain?  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  He received inpatient rehab at [facility], several outpatient sessions at [facility], and home health therapy (not clear when this ended).       Treatment Provided   Treatment provided  Cognitive-Linquistic      Pain Assessment   Pain Assessment  No/denies pain      Cognitive-Linquistic Treatment   Treatment focused on  Aphasia    Skilled Treatment  Patient and wife were  prompted to identify barriers to their communication, and both suggested an issue with going places and with ordering food. Patient and wife were encouraged to fill out various sheets to be used as visual supports for communicating about where they want to go. Patient and wife were sent home with pages and asked to practice using them, and identify other barriers to communication.      Assessment / Recommendations / Plan   Plan  Continue with current plan of care      Progression Toward Goals   Progression toward goals  Progressing toward goals       SLP Education - 07/10/19 1236    Education Details  communication partner training, low-tech AAC    Person(s) Educated  Patient    Methods  Explanation    Comprehension  Verbalized understanding         SLP Long Term Goals - 07/10/19 1421      SLP LONG TERM GOAL #1   Title  Patient will complete semantic feature word finding tasks with 80% accuracy.    Status  Partially Met    Target Date  08/07/19      SLP  LONG TERM GOAL #2   Title  Patient will generate grammatical and cogent sentence to complete simple/concrete linguistic task with 80% accuracy.    Status  Partially Met    Target Date  08/07/19      SLP LONG TERM GOAL #3   Title  Patient will complete 2 unit processing tasks with 80% accuracy without the need of repetition of task instructions or significant delays in responding.    Status  Partially Met    Target Date  08/07/19      SLP LONG TERM GOAL #4   Title  Patient will demonstrate reading comprehension for sentences with 80% accuracy.    Status  Partially Met    Target Date  08/07/19       Plan - 07/10/19 1237    Clinical Impression Statement  Given a functional and meaningful task, patient was highly engaged and motivated. Patient was eager to advocate for how he wanted his Psychologist, educational to be organized. He demonstrated excellent multi-modal communication today, especially in talking about the locations of  places (for example, pointing to a location and saying "across from here" or gesturing the location of the target).    Speech Therapy Frequency  2x / week    Duration  Other (comment)    Treatment/Interventions  Multimodal communcation approach;Language facilitation;SLP instruction and feedback;Patient/family education;Cueing hierarchy;Compensatory techniques    Potential to Achieve Goals  Good    Potential Considerations  Ability to learn/carryover information;Pain level;Family/community support;Co-morbidities;Previous level of function;Cooperation/participation level;Severity of impairments    SLP Home Exercise Plan  Provided    Consulted and Agree with Plan of Care  Patient;Family member/caregiver    Family Member Consulted  [name])       Patient will benefit from skilled therapeutic intervention in order to improve the following deficits and impairments:   Aphasia    Problem List There are no problems to display for this patient.   [name] 07/10/2019, 2:22 PM  [place] MAIN [facility] SERVICES [address] [place], [place], [address] Phone: [phone]   Fax:  [phone]   Name: Deaaron Fulghum MRN: [id number] Date of Birth: 1940-12-31

## 2019-07-14 ENCOUNTER — Other Ambulatory Visit: Payer: Self-pay

## 2019-07-14 ENCOUNTER — Ambulatory Visit: Payer: Medicare HMO | Attending: Neurology | Admitting: Speech Pathology

## 2019-07-14 DIAGNOSIS — R4701 Aphasia: Secondary | ICD-10-CM | POA: Diagnosis present

## 2019-07-15 ENCOUNTER — Encounter: Payer: Self-pay | Admitting: Speech Pathology

## 2019-07-15 NOTE — Therapy (Signed)
[place] MAIN [facility] SERVICES [address] [place], [place], [address] Phone: [phone]   Fax:  [phone]  Speech Language Pathology Treatment  Patient Details  Name: Peter Moore MRN: [id number] Date of Birth: 10-Jul-1940 Referring Provider (SLP): Dr. Manuella Ghazi   Encounter Date: 07/14/2019  End of Session - 07/15/19 1409    Visit Number  32    Number of Visits  40    Date for SLP Re-Evaluation  08/07/19    Authorization Type  Medicare    Authorization Time Period  07/14/2019    Authorization - Visit Number  1    Authorization - Number of Visits  10    SLP Start Time  0200    SLP Stop Time   0250    SLP Time Calculation (min)  50 min    Activity Tolerance  Patient tolerated treatment well       Past Medical History:  Diagnosis Date  . Coronary artery disease   . Diabetes mellitus without complication ([place])   . Hypertension   . Myocardial infarction [facility])     Past Surgical History:  Procedure Laterality Date  . CARDIAC SURGERY    . TONSILLECTOMY      There were no vitals filed for this visit.  Subjective Assessment - 07/15/19 1408    Subjective  Patient did not express frustration until the final prompt.    Patient is accompained by:  Family member    Currently in Pain?  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  He received inpatient rehab at [facility], several outpatient sessions at [facility], and home health therapy (not clear when this ended).       Treatment Provided   Treatment provided  Cognitive-Linquistic      Pain Assessment   Pain Assessment  No/denies pain      Cognitive-Linquistic Treatment   Treatment focused on  Aphasia    Skilled Treatment  The patient was asked to do a barrier task and describe one-item visuals using multi-modal communication. The patient  accurately conveyed 90% of items using multimodal communication. The patient gestured 30% of prompts. Patient generated the correct word or words for 75% of prompts, although on 30% of them, he added some elaborative speech. On 20% of sentences, patient utilized semantically related terms (I.e. "12 little, in a case" to describe target "eggs", "pearl at the end of a rope" to describe target "necklace"). When asked yes/no questions about the specific details of a photo, patient was inconsistent in accurate responses.     Assessment / Recommendations / Plan   Plan  Continue with current plan of care      Progression Toward Goals   Progression toward goals  Progressing toward goals       SLP Education - 07/15/19 1408    Education Details  communication partner training and multi-modal communication    Person(s) Educated  Patient    Methods  Explanation    Comprehension  Verbalized understanding         SLP Long Term Goals - 07/10/19 1421      SLP LONG TERM GOAL #1   Title  Patient will complete semantic feature word finding tasks with 80% accuracy.    Status  Partially Met  Target Date  08/07/19      SLP LONG TERM GOAL #2   Title  Patient will generate grammatical and cogent sentence to complete simple/concrete linguistic task with 80% accuracy.    Status  Partially Met    Target Date  08/07/19      SLP LONG TERM GOAL #3   Title  Patient will complete 2 unit processing tasks with 80% accuracy without the need of repetition of task instructions or significant delays in responding.    Status  Partially Met    Target Date  08/07/19      SLP LONG TERM GOAL #4   Title  Patient will demonstrate reading comprehension for sentences with 80% accuracy.    Status  Partially Met    Target Date  08/07/19       Plan - 07/15/19 1409    Clinical Impression Statement  Patient's communication is becoming highly functional as his use of multi-modal communication increases. He is highly  motivated by success and praise in his tasks. Patient expresses frustration if he accurately communicates a target word or phrase via semantic or gestural means, but is prompted to word-find the exact target word.    Speech Therapy Frequency  2x / week    Duration  Other (comment)    Treatment/Interventions  Multimodal communcation approach;Language facilitation;SLP instruction and feedback;Patient/family education;Cueing hierarchy;Compensatory techniques    Potential to Achieve Goals  Good    Potential Considerations  Ability to learn/carryover information;Pain level;Family/community support;Co-morbidities;Previous level of function;Cooperation/participation level;Severity of impairments    SLP Home Exercise Plan  Provided    Consulted and Agree with Plan of Care  Patient;Family member/caregiver    Family Member Consulted  [name])       Patient will benefit from skilled therapeutic intervention in order to improve the following deficits and impairments:   Aphasia    Problem List There are no problems to display for this patient.   [name] 07/15/2019, 2:38 PM  [place] MAIN [facility] SERVICES [address] [place], [place], [address] Phone: [phone]   Fax:  [phone]   Name: Peter Moore MRN: [id number] Date of Birth: [birth year]

## 2019-07-16 ENCOUNTER — Other Ambulatory Visit: Payer: Self-pay

## 2019-07-16 ENCOUNTER — Encounter: Payer: Self-pay | Admitting: Speech Pathology

## 2019-07-16 ENCOUNTER — Ambulatory Visit: Payer: Medicare HMO | Admitting: Speech Pathology

## 2019-07-16 DIAGNOSIS — R4701 Aphasia: Secondary | ICD-10-CM | POA: Diagnosis not present

## 2019-07-16 NOTE — Therapy (Signed)
[place] MAIN [facility] SERVICES [address] [place], [place], [address] Phone: [phone]   Fax:  [phone]  Speech Language Pathology Treatment  Patient Details  Name: Peter [facility] MRN: [id number] Date of Birth: April 16, 1941 Referring Provider (SLP): Dr. Manuella Ghazi   Encounter Date: 07/16/2019  End of Session - 07/16/19 1655    Visit Number  33    Number of Visits  40    Date for SLP Re-Evaluation  08/07/19    Authorization Type  Medicare    Authorization Time Period  07/14/2019    Authorization - Visit Number  2    Authorization - Number of Visits  10    SLP Start Time  0200    SLP Stop Time   0250    SLP Time Calculation (min)  50 min    Activity Tolerance  Patient tolerated treatment well       Past Medical History:  Diagnosis Date  . Coronary artery disease   . Diabetes mellitus without complication ([place])   . Hypertension   . Myocardial infarction [facility])     Past Surgical History:  Procedure Laterality Date  . CARDIAC SURGERY    . TONSILLECTOMY      There were no vitals filed for this visit.  Subjective Assessment - 07/16/19 1653    Subjective  Decreased frustration on task.    Patient is accompained by:  Family member    Currently in Pain?  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  He received inpatient rehab at [facility], several outpatient sessions at [facility], and home health therapy (not clear when this ended).       Treatment Provided   Treatment provided  Cognitive-Linquistic      Pain Assessment   Pain Assessment  No/denies pain      Cognitive-Linquistic Treatment   Treatment focused on  Aphasia    Skilled Treatment  The patient was asked to complete a barrier task given visual prompts, and asked to describe the photos using complete sentences (including multimodal  communication). The patient's accuracy in describing the photos (f=20) was measured by his wife's comprehension of what he generated. The wife was able to guess 90% of the photos based on the patient's verbal and multimodal communication techniques. The patient generated 10 complete sentences, and 80% of those sentences were generated independently with no cueing from the clinician. The patient was able to independently word-find with increased accuracy and utilize semantically-related words/phrases with increased accuracy.      Assessment / Recommendations / Plan   Plan  Continue with current plan of care      Progression Toward Goals   Progression toward goals  Progressing toward goals       SLP Education - 07/16/19 1653    Education Details  communication partner training and multi-modal communciation    Person(s) Educated  Patient    Methods  Explanation    Comprehension  Verbalized understanding         SLP Long Term Goals - 07/10/19 1421      SLP LONG TERM GOAL #1   Title  Patient will complete semantic feature word finding tasks with 80% accuracy.    Status  Partially Met    Target Date  08/07/19      SLP LONG TERM GOAL #2   Title  Patient will generate grammatical and cogent sentence to complete simple/concrete linguistic task with 80% accuracy.    Status  Partially Met    Target Date  08/07/19      SLP LONG TERM GOAL #3   Title  Patient will complete 2 unit processing tasks with 80% accuracy without the need of repetition of task instructions or significant delays in responding.    Status  Partially Met    Target Date  08/07/19      SLP LONG TERM GOAL #4   Title  Patient will demonstrate reading comprehension for sentences with 80% accuracy.    Status  Partially Met    Target Date  08/07/19       Plan - 07/16/19 1655    Clinical Impression Statement  Patient's use of multi-modal communication continues to improve. He is highly motivated by success and praise in  his tasks. Patient expresses frustration if he accurately communicates a target word or phrase via semantic or gestural means but is prompted to word-find the exact target word. The patient and his wife continue to demonstrate improved functional communication.    Speech Therapy Frequency  2x / week    Duration  Other (comment)    Treatment/Interventions  Multimodal communcation approach;Language facilitation;SLP instruction and feedback;Patient/family education;Cueing hierarchy;Compensatory techniques    Potential to Achieve Goals  Good    Potential Considerations  Ability to learn/carryover information;Pain level;Family/community support;Co-morbidities;Previous level of function;Cooperation/participation level;Severity of impairments    SLP Home Exercise Plan  Provided    Consulted and Agree with Plan of Care  Patient;Family member/caregiver    Family Member Consulted  Wife [facility])       Patient will benefit from skilled therapeutic intervention in order to improve the following deficits and impairments:   Aphasia    Problem List There are no problems to display for this patient.   Peter Moore 07/16/2019, 4:56 PM  [place] MAIN [facility] SERVICES [address] [place], [place], [address] Phone: [phone]   Fax:  [phone]   Name: Peter [facility] MRN: [id number] Date of Birth: 1940/10/04

## 2019-07-21 ENCOUNTER — Other Ambulatory Visit: Payer: Self-pay

## 2019-07-21 ENCOUNTER — Ambulatory Visit: Payer: Medicare HMO | Admitting: Speech Pathology

## 2019-07-21 ENCOUNTER — Encounter: Payer: Self-pay | Admitting: Speech Pathology

## 2019-07-21 DIAGNOSIS — R4701 Aphasia: Secondary | ICD-10-CM

## 2019-07-21 NOTE — Therapy (Signed)
[place] MAIN [facility] SERVICES [address] [place], [place], [address] Phone: [phone]   Fax:  [phone]  Speech Language Pathology Treatment  Patient Details  Name: Peter Moore MRN: [id number] Date of Birth: Mar 16, 1941 Referring Provider (SLP): Dr. Manuella Ghazi   Encounter Date: 07/21/2019  End of Session - 07/21/19 1543    Visit Number  34    Number of Visits  40    Date for SLP Re-Evaluation  08/07/19    Authorization Type  Medicare    Authorization Time Period  07/14/2019    Authorization - Visit Number  3    Authorization - Number of Visits  10    SLP Start Time  0130    SLP Stop Time   0220    SLP Time Calculation (min)  50 min    Activity Tolerance  Patient tolerated treatment well       Past Medical History:  Diagnosis Date  . Coronary artery disease   . Diabetes mellitus without complication ([place])   . Hypertension   . Myocardial infarction [facility])     Past Surgical History:  Procedure Laterality Date  . CARDIAC SURGERY    . TONSILLECTOMY      There were no vitals filed for this visit.  Subjective Assessment - 07/21/19 1542    Subjective  Improved functional multi-modal communication    Patient is accompained by:  Family member    Currently in Pain?  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  He received inpatient rehab at [facility], several outpatient sessions at [facility], and home health therapy (not clear when this ended).       Treatment Provided   Treatment provided  Cognitive-Linquistic      Pain Assessment   Pain Assessment  No/denies pain      Cognitive-Linquistic Treatment   Treatment focused on  Aphasia    Skilled Treatment  The patient was asked to complete a barrier task given visual prompts, and asked to describe the photos using complete sentences  (including multimodal communication). The patient's accuracy in describing the photos (f=10) was measured by his wife's comprehension of what he was saying. The wife was able to guess 90% of the photos based on the patient's verbal and multimodal communication techniques. The patient's wife was then asked to create sentences about novel photos and have the patient guess what she was talking about. The patient demonstrated excellent multi-modal communication strategies but presented with severe word-finding difficulty and became agitated on the task because he was unable to generate the correct words.      Assessment / Recommendations / Plan   Plan  Continue with current plan of care      Progression Toward Goals   Progression toward goals  Progressing toward goals       SLP Education - 07/21/19 1543    Education Details  communication partner training and multi-modal communciation    Person(s) Educated  Patient;Spouse    Methods  Explanation    Comprehension  Verbalized understanding         SLP Long Term Goals - 07/10/19 1421      SLP LONG TERM GOAL #1   Title  Patient will complete semantic feature word finding tasks with 80%  accuracy.    Status  Partially Met    Target Date  08/07/19      SLP LONG TERM GOAL #2   Title  Patient will generate grammatical and cogent sentence to complete simple/concrete linguistic task with 80% accuracy.    Status  Partially Met    Target Date  08/07/19      SLP LONG TERM GOAL #3   Title  Patient will complete 2 unit processing tasks with 80% accuracy without the need of repetition of task instructions or significant delays in responding.    Status  Partially Met    Target Date  08/07/19      SLP LONG TERM GOAL #4   Title  Patient will demonstrate reading comprehension for sentences with 80% accuracy.    Status  Partially Met    Target Date  08/07/19       Plan - 07/21/19 1543    Clinical Impression Statement  Patient's communication is  becoming highly functional as his use of multi-modal communication increases. He is highly motivated by success and praise in his tasks. Patient expresses frustration if he accurately communicates a target word or phrase via semantic or gestural means but is prompted to word-find the exact target word. The patient and his wife continue to demonstrate improved functional communication.    Speech Therapy Frequency  2x / week    Duration  Other (comment)    Treatment/Interventions  Multimodal communcation approach;Language facilitation;SLP instruction and feedback;Patient/family education;Cueing hierarchy;Compensatory techniques    Potential to Achieve Goals  Good    Potential Considerations  Ability to learn/carryover information;Pain level;Family/community support;Co-morbidities;Previous level of function;Cooperation/participation level;Severity of impairments    SLP Home Exercise Plan  Provided    Consulted and Agree with Plan of Care  Patient;Family member/caregiver    Family Member Consulted  [name])       Patient will benefit from skilled therapeutic intervention in order to improve the following deficits and impairments:   Aphasia    Problem List There are no problems to display for this patient.   Peter Moore 07/21/2019, 3:44 PM  [place] MAIN [facility] SERVICES [address] [place], [place], [address] Phone: [phone]   Fax:  [phone]   Name: Peter Moore MRN: [id number] Date of Birth: [birth year]

## 2019-07-23 ENCOUNTER — Other Ambulatory Visit: Payer: Self-pay

## 2019-07-23 ENCOUNTER — Ambulatory Visit: Payer: Medicare HMO | Admitting: Speech Pathology

## 2019-07-23 DIAGNOSIS — R4701 Aphasia: Secondary | ICD-10-CM | POA: Diagnosis not present

## 2019-07-24 ENCOUNTER — Encounter: Payer: Self-pay | Admitting: Speech Pathology

## 2019-07-24 NOTE — Therapy (Signed)
[place] MAIN [facility] SERVICES [address] [place], [place], [address] Phone: [phone]   Fax:  [phone]  Speech Language Pathology Treatment  Patient Details  Name: Peter Moore MRN: [id number] Date of Birth: 01/17/1941 Referring Provider (SLP): Dr. Manuella Ghazi   Encounter Date: 07/23/2019  End of Session - 07/24/19 1154    Visit Number  35    Number of Visits  40    Date for SLP Re-Evaluation  08/07/19    Authorization Type  Medicare    Authorization Time Period  07/14/2019    Authorization - Visit Number  4    Authorization - Number of Visits  10    SLP Start Time  1300    SLP Stop Time   1350    SLP Time Calculation (min)  50 min    Activity Tolerance  Patient tolerated treatment well       Past Medical History:  Diagnosis Date  . Coronary artery disease   . Diabetes mellitus without complication ([place])   . Hypertension   . Myocardial infarction [facility])     Past Surgical History:  Procedure Laterality Date  . CARDIAC SURGERY    . TONSILLECTOMY      There were no vitals filed for this visit.  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  He received inpatient rehab at [facility], several outpatient sessions at [facility], and home health therapy (not clear when this ended).       Treatment Provided   Treatment provided  Cognitive-Linquistic      Pain Assessment   Pain Assessment  No/denies pain      Cognitive-Linquistic Treatment   Treatment focused on  Aphasia    Skilled Treatment  SELF EXPRESSION: In a barrier task, using true words/related words/gestures, describe complex pictures (well-known to therapist) independently with 80% accuracy.  Answers questions to  clarify for the remaining 20%.         Assessment / Recommendations / Plan   Plan  Continue with current plan of care      Progression Toward Goals   Progression toward goals  Progressing toward goals       SLP Education - 07/24/19 1154    Education Details  multi-modal communication    Person(s) Educated  Patient;Spouse    Methods  Explanation    Comprehension  Verbalized understanding         SLP Long Term Goals - 07/10/19 1421      SLP LONG TERM GOAL #1   Title  Patient will complete semantic feature word finding tasks with 80% accuracy.    Status  Partially Met    Target Date  08/07/19      SLP LONG TERM GOAL #2   Title  Patient will generate grammatical and cogent sentence to complete simple/concrete linguistic task with 80% accuracy.    Status  Partially Met    Target Date  08/07/19      SLP LONG TERM GOAL #3   Title  Patient will complete 2 unit processing tasks with 80% accuracy without the need of repetition of task instructions or significant delays  in responding.    Status  Partially Met    Target Date  08/07/19      SLP LONG TERM GOAL #4   Title  Patient will demonstrate reading comprehension for sentences with 80% accuracy.    Status  Partially Met    Target Date  08/07/19       Plan - 07/24/19 1155    Clinical Impression Statement  Patient's communication is becoming highly functional as his use of multi-modal communication increases. He is highly motivated by success and praise in his tasks. Patient expresses frustration if he accurately communicates a target word or phrase via semantic or gestural means but is prompted to word-find the exact target word. The patient and his wife continue to demonstrate improved functional communication.    Speech Therapy Frequency  2x / week    Duration  Other (comment)    Treatment/Interventions  Multimodal communcation approach;Language facilitation;SLP instruction and feedback;Patient/family education;Cueing  hierarchy;Compensatory techniques    Potential to Achieve Goals  Good    Potential Considerations  Ability to learn/carryover information;Pain level;Family/community support;Co-morbidities;Previous level of function;Cooperation/participation level;Severity of impairments    Consulted and Agree with Plan of Care  Patient;Family member/caregiver    Family Member Consulted  [name])       Patient will benefit from skilled therapeutic intervention in order to improve the following deficits and impairments:   Aphasia    Problem List There are no problems to display for this patient.  [name], [place], Susie 07/24/2019, 11:56 AM  [place] MAIN [facility] SERVICES [address] [place], [place], [address] Phone: [phone]   Fax:  [phone]   Name: Peter Moore MRN: [id number] Date of Birth: December 15, 1940

## 2019-07-27 ENCOUNTER — Encounter: Payer: Self-pay | Admitting: Speech Pathology

## 2019-07-27 ENCOUNTER — Other Ambulatory Visit: Payer: Self-pay

## 2019-07-27 ENCOUNTER — Ambulatory Visit: Payer: Medicare HMO | Admitting: Speech Pathology

## 2019-07-27 DIAGNOSIS — R4701 Aphasia: Secondary | ICD-10-CM | POA: Diagnosis not present

## 2019-07-27 NOTE — Therapy (Signed)
[facility] SERVICES [address] [place], [place], [address] Phone: [phone]   Fax:  [phone]  Speech Language Pathology Treatment  Patient Details  Name: Peter Moore MRN: [id number] Date of Birth: July 28, 1940 Referring Provider (SLP): Dr. Manuella Ghazi   Encounter Date: 07/27/2019  End of Session - 07/27/19 1312    Visit Number  36    Number of Visits  40    Date for SLP Re-Evaluation  08/07/19    Authorization Type  Medicare    Authorization Time Period  07/14/2019    Authorization - Visit Number  5    Authorization - Number of Visits  10    SLP Start Time  1300    SLP Stop Time   1350    SLP Time Calculation (min)  50 min    Activity Tolerance  Patient tolerated treatment well       Past Medical History:  Diagnosis Date  . Coronary artery disease   . Diabetes mellitus without complication ([facility])   . Hypertension   . Myocardial infarction [facility])     Past Surgical History:  Procedure Laterality Date  . CARDIAC SURGERY    . TONSILLECTOMY      There were no vitals filed for this visit.  Subjective Assessment - 07/27/19 1311    Subjective  Complaint of severe word-finding difficulty/dizziness/trouble walking this weekend (Sat-Sun.)    Patient is accompained by:  Family member    Currently in Pain?  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  He received [facility] at [facility], [facility] outpatient sessions at [facility], and home health therapy (not clear when this ended).       Treatment Provided   Treatment provided  Cognitive-Linquistic      Pain Assessment   Pain Assessment  No/denies pain      Cognitive-Linquistic Treatment   Treatment focused on  Aphasia    Skilled Treatment  In a barrier task, using true words/related words/gestures, describe complex pictures  (well-known to therapist) independently with 95% accuracy.  Generated correct answers given semantic cues from the clinician with 30% accuracy given one question. Semantic cues increased accuracy to 50%. Visual cues increased accuracy to 90%. Phonemic cues increased accuracy to 100%.      Assessment / Recommendations / Plan   Plan  Continue with current plan of care      Progression Toward Goals   Progression toward goals  Progressing toward goals       SLP Education - 07/27/19 1312    Education Details  multi-modal communication    Person(s) Educated  Patient    Methods  Explanation    Comprehension  Verbalized understanding         SLP Long Term Goals - 07/10/19 1421      SLP LONG TERM GOAL #1   Title  Patient will complete semantic feature word finding tasks with 80% accuracy.    Status  Partially Met    Target Date  08/07/19      SLP LONG TERM GOAL #2   Title  Patient will generate grammatical and cogent sentence to complete simple/concrete linguistic task with 80% accuracy.    Status  Partially Met    Target Date  08/07/19  SLP LONG TERM GOAL #3   Title  Patient will complete 2 unit processing tasks with 80% accuracy without the need of repetition of task instructions or significant delays in responding.    Status  Partially Met    Target Date  08/07/19      SLP LONG TERM GOAL #4   Title  Patient will demonstrate reading comprehension for sentences with 80% accuracy.    Status  Partially Met    Target Date  08/07/19       Plan - 07/27/19 1313    Clinical Impression Statement  Patient's communication is becoming highly functional as his use of multi-modal communication increases. He is highly motivated by success and praise in his tasks. Patient expresses frustration if he accurately communicates a target word or phrase via semantic or gestural means but is prompted to word-find the exact target word. The patient and his wife continue to demonstrate improved  functional communication. The couple shared that they noticed a decrease in the patient's verbal output this weekend, accompanied by some dizziness and trouble walking. The task today seemed more difficult than it has been in previous sessions, as the patient did present with some word-finding difficulty, especially noted in spontaneous speech. The patient was advised to call his PCP.    Speech Therapy Frequency  2x / week    Duration  Other (comment)    Treatment/Interventions  Multimodal communcation approach;Language facilitation;SLP instruction and feedback;Patient/family education;Cueing hierarchy;Compensatory techniques    Potential to Achieve Goals  Good    Potential Considerations  Ability to learn/carryover information;Pain level;Family/community support;Co-morbidities;Previous level of function;Cooperation/participation level;Severity of impairments    Consulted and Agree with Plan of Care  Patient;Family member/caregiver    Family Member Consulted  Wife [facility])       Patient will benefit from skilled therapeutic intervention in order to improve the following deficits and impairments:   Aphasia    Problem List There are no problems to display for this patient.   Peter Moore 07/27/2019, 1:16 PM  [place] MAIN [facility] SERVICES [address] [place], [place], [address] Phone: [phone]   Fax:  [phone]   Name: [facility] MRN: [id number] Date of Birth: 1941/01/27

## 2019-07-29 ENCOUNTER — Ambulatory Visit: Payer: Medicare HMO | Admitting: Speech Pathology

## 2019-07-29 ENCOUNTER — Encounter: Payer: Self-pay | Admitting: Speech Pathology

## 2019-07-29 ENCOUNTER — Other Ambulatory Visit: Payer: Self-pay

## 2019-07-29 DIAGNOSIS — R4701 Aphasia: Secondary | ICD-10-CM | POA: Diagnosis not present

## 2019-07-29 NOTE — Therapy (Signed)
[place] MAIN [facility] SERVICES [address] [place], [place], [address] Phone: [phone]   Fax:  [phone]  Speech Language Pathology Treatment  Patient Details  Name: Peter Moore MRN: [id number] Date of Birth: Feb 17, 1941 Referring Provider (SLP): Dr. Manuella Ghazi   Encounter Date: 07/29/2019  End of Session - 07/29/19 1307    Visit Number  37    Number of Visits  40    Date for SLP Re-Evaluation  08/07/19    Authorization Type  Medicare    Authorization Time Period  07/14/2019    Authorization - Visit Number  5    Authorization - Number of Visits  1    SLP Start Time  1100    SLP Stop Time   1150    SLP Time Calculation (min)  50 min    Activity Tolerance  Patient tolerated treatment well       Past Medical History:  Diagnosis Date  . Coronary artery disease   . Diabetes mellitus without complication ([place])   . Hypertension   . Myocardial infarction [facility])     Past Surgical History:  Procedure Laterality Date  . CARDIAC SURGERY    . TONSILLECTOMY      There were no vitals filed for this visit.  Subjective Assessment - 07/29/19 1305    Subjective  Improved functional multi-modal communication    Patient is accompained by:  Family member    Currently in Pain?  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  He received inpatient rehab at [facility], several outpatient sessions at [facility], and home health therapy (not clear when this ended).       Treatment Provided   Treatment provided  Cognitive-Linquistic      Pain Assessment   Pain Assessment  No/denies pain      Cognitive-Linquistic Treatment   Treatment focused on  Aphasia    Skilled Treatment  The patient and his wife were educated on strategies for effective communication with individuals with aphasia, and asked to provide  some examples of how to utilize these strategies in their daily life. The patient and his wife were then prompted to participate in a spontaneous speech task with the goal to utilize new communicative strategies. The patient shared that his day-to-day life has been negatively impacted by his aphasia, for example, he does not play golf, talk on the phone, or communicate with family members much any more. The patient expressed a desire to be more involved.      Assessment / Recommendations / Plan   Plan  Continue with current plan of care      Progression Toward Goals   Progression toward goals  Progressing toward goals       SLP Education - 07/29/19 1307    Education Details  multi-modal communication and communication partner training    Person(s) Educated  Patient    Methods  Explanation    Comprehension  Verbalized understanding         SLP Long Term Goals - 07/10/19 1421      SLP LONG TERM GOAL #1   Title  Patient will complete semantic feature word finding tasks with 80% accuracy.    Status  Partially Met    Target Date  08/07/19  SLP LONG TERM GOAL #2   Title  Patient will generate grammatical and cogent sentence to complete simple/concrete linguistic task with 80% accuracy.    Status  Partially Met    Target Date  08/07/19      SLP LONG TERM GOAL #3   Title  Patient will complete 2 unit processing tasks with 80% accuracy without the need of repetition of task instructions or significant delays in responding.    Status  Partially Met    Target Date  08/07/19      SLP LONG TERM GOAL #4   Title  Patient will demonstrate reading comprehension for sentences with 80% accuracy.    Status  Partially Met    Target Date  08/07/19       Plan - 07/29/19 1308    Clinical Impression Statement  Patient's communication is becoming highly functional as his use of multi-modal communication increases. However, the patient reports that he is not participating in activities that he  used to due to aphasia. He also continues to present with severe-word finding difficulty. The patient will benefit from continued therapeutic interventions to target functional communication.    Speech Therapy Frequency  2x / week    Duration  Other (comment)    Treatment/Interventions  Multimodal communcation approach;Language facilitation;SLP instruction and feedback;Patient/family education;Cueing hierarchy;Compensatory techniques    Potential to Achieve Goals  Good    Potential Considerations  Ability to learn/carryover information;Pain level;Family/community support;Co-morbidities;Previous level of function;Cooperation/participation level;Severity of impairments    SLP Home Exercise Plan  Provided    Consulted and Agree with Plan of Care  Patient;Family member/caregiver    Family Member Consulted  [name])       Patient will benefit from skilled therapeutic intervention in order to improve the following deficits and impairments:   Aphasia    Problem List There are no problems to display for this patient.   Alexis Mizuno 07/29/2019, 1:12 PM  [place] MAIN [facility] SERVICES [address] [place], [place], [address] Phone: [phone]   Fax:  [phone]   Name: Pat Sires MRN: [id number] Date of Birth: 09-Oct-1940

## 2019-08-03 ENCOUNTER — Ambulatory Visit: Payer: Medicare HMO | Admitting: Speech Pathology

## 2019-08-03 ENCOUNTER — Other Ambulatory Visit: Payer: Self-pay

## 2019-08-03 ENCOUNTER — Encounter: Payer: Self-pay | Admitting: Speech Pathology

## 2019-08-03 DIAGNOSIS — R4701 Aphasia: Secondary | ICD-10-CM | POA: Diagnosis not present

## 2019-08-03 NOTE — Therapy (Signed)
[place] MAIN [facility] SERVICES [address] [place], [place], [address] Phone: [phone]   Fax:  [phone]  Speech Language Pathology Treatment  Patient Details  Name: Peter Moore MRN: [id number] Date of Birth: 1941/01/08 Referring Provider (SLP): Dr. Manuella Ghazi   Encounter Date: 08/03/2019  End of Session - 08/03/19 1320    Visit Number  38    Number of Visits  40    Date for SLP Re-Evaluation  08/07/19    Authorization Type  Medicare    Authorization Time Period  07/14/2019    Authorization - Visit Number  7    Authorization - Number of Visits  10    SLP Start Time  1100    SLP Stop Time   1150    SLP Time Calculation (min)  50 min    Activity Tolerance  Patient tolerated treatment well       Past Medical History:  Diagnosis Date  . Coronary artery disease   . Diabetes mellitus without complication ([place])   . Hypertension   . Myocardial infarction [facility])     Past Surgical History:  Procedure Laterality Date  . CARDIAC SURGERY    . TONSILLECTOMY      There were no vitals filed for this visit.  Subjective Assessment - 08/03/19 1319    Subjective  Improved functional multi-modal communication    Patient is accompained by:  Family member    Currently in Pain?  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  He received inpatient rehab at [facility], several outpatient sessions at [facility], and home health therapy (not clear when this ended).       Treatment Provided   Treatment provided  Cognitive-Linquistic      Pain Assessment   Pain Assessment  No/denies pain      Cognitive-Linquistic Treatment   Treatment focused on  Aphasia    Skilled Treatment The patient and his wife were prompted to complete a document which prompted the patient to generate important personal data, such as  his name, address, DOB, phone numbers, emergency contact information, etc. The patient was able to independently generate nominative data (name, address, etc.) but unable to independently generate numerical data (phone numbers, DOB, zip code, etc.) The patient pulled out his wallet for a mock trial of how to determine insurance provider and provide an ID number. The patient was able to find the insurance provider name on the card, but unable locate the ID number without assistance from his wife. Lastly, he completed a barrier task with his wife and using multi-modal strategies, was able to generate cogent, clear descriptors of 100% of prompts using multi-modal strategies.     Assessment / Recommendations / Plan   Plan  Continue with current plan of care      Progression Toward Goals   Progression toward goals  Progressing toward goals       SLP Education - 08/03/19 1320    Education Details  multi-modal communication, functional language, communication partner training    Person(s) Educated  Patient    Methods  Explanation    Comprehension  Verbalized understanding         SLP Long Term Goals - 07/10/19 1421      SLP LONG TERM  GOAL #1   Title  Patient will complete semantic feature word finding tasks with 80% accuracy.    Status  Partially Met    Target Date  08/07/19      SLP LONG TERM GOAL #2   Title  Patient will generate grammatical and cogent sentence to complete simple/concrete linguistic task with 80% accuracy.    Status  Partially Met    Target Date  08/07/19      SLP LONG TERM GOAL #3   Title  Patient will complete 2 unit processing tasks with 80% accuracy without the need of repetition of task instructions or significant delays in responding.    Status  Partially Met    Target Date  08/07/19      SLP LONG TERM GOAL #4   Title  Patient will demonstrate reading comprehension for sentences with 80% accuracy.    Status  Partially Met    Target Date  08/07/19        Plan - 08/03/19 1321    Clinical Impression Statement  Patient's communication is becoming highly functional as his use of multi-modal communication increases. However, the patient reports that he is not participating in activities that he used to due to aphasia. He also continues to present with severe-word finding difficulty. The patient will benefit from continued therapeutic interventions to target functional communication, particularly in functional areas such as managing personal finances, locating information, answering the phone, and participating in life activities.   Speech Therapy Frequency  2x / week    Duration  Other (comment)    Treatment/Interventions  Multimodal communcation approach;Language facilitation;SLP instruction and feedback;Patient/family education;Cueing hierarchy;Compensatory techniques    Potential to Achieve Goals  Good    Potential Considerations  Ability to learn/carryover information;Pain level;Family/community support;Co-morbidities;Previous level of function;Cooperation/participation level;Severity of impairments    SLP Home Exercise Plan  Provided    Consulted and Agree with Plan of Care  Patient;Family member/caregiver    Family Member Consulted  [name])       Patient will benefit from skilled therapeutic intervention in order to improve the following deficits and impairments:   Aphasia    Problem List There are no problems to display for this patient.   [name] 08/03/2019, 2:38 PM  [place] MAIN [facility] SERVICES [address] [place], [place], [address] Phone: [phone]   Fax:  [phone]   Name: Peter Moore MRN: [id number] Date of Birth: August 15, 1940

## 2019-08-05 ENCOUNTER — Other Ambulatory Visit: Payer: Self-pay

## 2019-08-05 ENCOUNTER — Encounter: Payer: Self-pay | Admitting: Speech Pathology

## 2019-08-05 ENCOUNTER — Ambulatory Visit: Payer: Medicare HMO | Admitting: Speech Pathology

## 2019-08-05 DIAGNOSIS — R4701 Aphasia: Secondary | ICD-10-CM

## 2019-08-05 NOTE — Therapy (Signed)
[place] MAIN [facility] SERVICES [address] [place], [place], [address] Phone: [phone]   Fax:  [phone]  Speech Language Pathology Treatment  Patient Details  Name: Peter Moore MRN: [id number] Date of Birth: 17-Feb-1941 Referring Provider (SLP): Dr. Manuella Ghazi   Encounter Date: 08/05/2019  End of Session - 08/05/19 1308    Visit Number  39    Number of Visits  40    Date for SLP Re-Evaluation  08/07/19    Authorization Type  Medicare    Authorization Time Period  07/14/2019    Authorization - Visit Number  8    Authorization - Number of Visits  10    SLP Start Time  1100    SLP Stop Time   1150    SLP Time Calculation (min)  50 min    Activity Tolerance  Patient tolerated treatment well       Past Medical History:  Diagnosis Date  . Coronary artery disease   . Diabetes mellitus without complication ([place])   . Hypertension   . Myocardial infarction [facility])     Past Surgical History:  Procedure Laterality Date  . CARDIAC SURGERY    . TONSILLECTOMY      There were no vitals filed for this visit.  Subjective Assessment - 08/05/19 1307    Subjective  Patients wife did not attend today.            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  He received inpatient rehab at [facility], several outpatient sessions at [facility], and home health therapy (not clear when this ended).       Treatment Provided   Treatment provided  Cognitive-Linquistic      Pain Assessment   Pain Assessment  No/denies pain      Cognitive-Linquistic Treatment   Treatment focused on  Aphasia    Skilled Treatment  The patient was given a functional task in which he was prompted to fill out 3 checks. He completed the first one independently and missed 2 portions (writing numbers, date). On the second task, the patient directed the  clinician on how to write a check and did so with 100% accuracy and clear details. On the third task, the patient made it half-way though and quit due to difficulty with writing numbers. Given a barrier task, the patient was 100% accurate in conveying accurate information and resolving communication breakdown. He answered yes/no clarifying questions about the photos with 100% accuracy.       Assessment / Recommendations / Plan   Plan  Continue with current plan of care      Progression Toward Goals   Progression toward goals  Progressing toward goals       SLP Education - 08/05/19 1308    Education Details  multi-modal communication, functional language    Person(s) Educated  Patient    Methods  Explanation    Comprehension  Verbalized understanding         SLP Long Term Goals - 07/10/19 1421      SLP LONG TERM GOAL #1   Title  Patient will complete semantic feature word finding tasks with 80% accuracy.    Status  Partially Met    Target Date  08/07/19      SLP LONG TERM GOAL #2   Title  Patient will generate grammatical and cogent sentence to complete simple/concrete linguistic task with 80% accuracy.    Status  Partially Met    Target Date  08/07/19      SLP LONG TERM GOAL #3   Title  Patient will complete 2 unit processing tasks with 80% accuracy without the need of repetition of task instructions or significant delays in responding.    Status  Partially Met    Target Date  08/07/19      SLP LONG TERM GOAL #4   Title  Patient will demonstrate reading comprehension for sentences with 80% accuracy.    Status  Partially Met    Target Date  08/07/19       Plan - 08/05/19 1309    Clinical Impression Statement  The patient's communication is becoming highly functional as his use of multi-modal communciation increases. The patient is reluctant to try tasks that he cannot do at the level that he used to be able to complete them and continues to report decreased participation  in life activites.    Speech Therapy Frequency  2x / week    Duration  Other (comment)    Treatment/Interventions  Multimodal communcation approach;Language facilitation;SLP instruction and feedback;Patient/family education;Cueing hierarchy;Compensatory techniques    Potential to Achieve Goals  Good    Potential Considerations  Ability to learn/carryover information;Pain level;Family/community support;Co-morbidities;Previous level of function;Cooperation/participation level;Severity of impairments    SLP Home Exercise Plan  Provided    Consulted and Agree with Plan of Care  Patient    Family Member Consulted  --       Patient will benefit from skilled therapeutic intervention in order to improve the following deficits and impairments:   Aphasia    Problem List There are no problems to display for this patient.   Jeramyah Goodpasture 08/05/2019, 1:13 PM  [place] MAIN [facility] SERVICES [address] [place], [place], [address] Phone: [phone]   Fax:  [phone]   Name: Jacari Iannello MRN: [id number] Date of Birth: 1940-12-03

## 2019-08-11 ENCOUNTER — Encounter: Payer: Self-pay | Admitting: Speech Pathology

## 2019-08-11 ENCOUNTER — Ambulatory Visit: Payer: Medicare HMO | Admitting: Speech Pathology

## 2019-08-11 ENCOUNTER — Other Ambulatory Visit: Payer: Self-pay

## 2019-08-11 DIAGNOSIS — R4701 Aphasia: Secondary | ICD-10-CM

## 2019-08-11 NOTE — Therapy (Signed)
[place] MAIN [facility] SERVICES [address] [place], [place], [address] Phone: [phone]   Fax:  [phone]  Speech Language Pathology Treatment/Re-Certification  Patient Details  Name: Peter Moore MRN: [id number] Date of Birth: [birth year] Referring Provider (SLP): Dr. Manuella Ghazi   Encounter Date: 08/11/2019  End of Session - 08/11/19 1613    Visit Number  40    Number of Visits  25    Date for SLP Re-Evaluation  10/09/19    Authorization Type  Medicare    Authorization Time Period  07/14/2019    Authorization - Visit Number  9    Authorization - Number of Visits  10    SLP Start Time  1400    SLP Stop Time   1450    SLP Time Calculation (min)  50 min    Activity Tolerance  Patient tolerated treatment well       Past Medical History:  Diagnosis Date  . Coronary artery disease   . Diabetes mellitus without complication ([place])   . Hypertension   . Myocardial infarction [facility])     Past Surgical History:  Procedure Laterality Date  . CARDIAC SURGERY    . TONSILLECTOMY      There were no vitals filed for this visit.  Subjective Assessment - 08/11/19 1611    Subjective Patient agreed he wants to participate more.            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  He received inpatient rehab at [facility], several outpatient sessions at [facility], and home health therapy (not clear when this ended).       Treatment Provided   Treatment provided  Cognitive-Linquistic      Pain Assessment   Pain Assessment  No/denies pain      Cognitive-Linquistic Treatment   Treatment focused on  Aphasia    Skilled Treatment  The patient was asked to identify personal goals for himself. He wrote answering the phone and maintaining a conversation, writing checks, paying bills on the computer, memorizing address/DOBs, and  working the oven. The patient was then educated on the [place] and was asked to create a personalized goal and identify skills +abilities, intententional strategies, environmental supports, and motivation and confidence. The patient struggled with word-finding difficulty on the task and expressed frustration with the task. However, when prompted to write his address, he was able to do so given (excellent) semantic and phonemic cues from his wife, and individually implementing some of the strategies he identified. He then wrote the address a second time completely independently.      Assessment / Recommendations / Plan   Plan  Continue with current plan of care      Progression Toward Goals   Progression toward goals  Progressing toward goals       SLP Education - 08/11/19 1612    Education Details  functional language, communication partner training    Person(s) Educated  Patient;Spouse    Methods  Explanation    Comprehension  Verbalized understanding         SLP Long Term Goals - 08/12/19 1507      SLP LONG TERM GOAL #1   Title  Patient will complete semantic feature word finding tasks with 80% accuracy.    Time  8    Period  Weeks    Status  Partially Met    Target Date  10/09/19      SLP LONG TERM GOAL #2   Title  Patient will generate grammatical and cogent sentence to complete simple/concrete linguistic task with 80% accuracy.    Time  8    Period  Weeks    Status  Partially Met    Target Date  10/09/19      SLP LONG TERM GOAL #3   Title  Patient will complete 2 unit processing tasks with 80% accuracy without the need of repetition of task instructions or significant delays in responding.    Time  8    Period  Weeks    Status  Partially Met    Target Date  10/09/19      SLP LONG TERM GOAL #4   Title  Patient will demonstrate reading comprehension for sentences with 80% accuracy.    Time  8    Period  Weeks    Status  Partially Met    Target Date  10/09/19        Plan - 08/11/19 1613    Clinical Impression Statement  The patient's communication is becoming highly functional as his use of multi-modal communciation increases. Per spousal report, the patient remembered a password to use on the computer and correctly typed it in. The patient is reported and observed to quit tasks when he becomes too frustrated. The patient is reluctant to try tasks that he cannot do at the level that he used to be able to complete them and continues to report decreased participation in life activites. He persisted despite difficulty today.    Speech Therapy Frequency  2x / week    Duration  Other (comment)    Treatment/Interventions  Multimodal communcation approach;Language facilitation;SLP instruction and feedback;Patient/family education;Cueing hierarchy;Compensatory techniques    Potential to Achieve Goals  Good    Potential Considerations  Ability to learn/carryover information;Pain level;Family/community support;Co-morbidities;Previous level of function;Cooperation/participation level;Severity of impairments    SLP Home Exercise Plan  Provided    Consulted and Agree with Plan of Care  Patient    Family Member Consulted  Wife Peter Moore)       Patient will benefit from skilled therapeutic intervention in order to improve the following deficits and impairments:   Aphasia - Plan: SLP plan of care cert/re-cert    Problem List There are no problems to display for this patient.   Peter Moore 08/12/2019, 3:09 PM  [place] MAIN [facility] SERVICES [address] [place], [place], [address] Phone: [phone]   Fax:  [phone]   Name: Peter Moore MRN: [id number] Date of Birth: 26-Nov-1940

## 2019-08-13 ENCOUNTER — Ambulatory Visit: Payer: Medicare HMO | Attending: Neurology | Admitting: Speech Pathology

## 2019-08-13 ENCOUNTER — Other Ambulatory Visit: Payer: Self-pay

## 2019-08-13 ENCOUNTER — Encounter: Payer: Self-pay | Admitting: Speech Pathology

## 2019-08-13 DIAGNOSIS — R4701 Aphasia: Secondary | ICD-10-CM | POA: Insufficient documentation

## 2019-08-13 NOTE — Therapy (Signed)
[place] MAIN [facility] SERVICES [address] [place], [place], [address] Phone: [phone]   Fax:  [phone]  Speech Language Pathology Treatment/Progress Note  Speech Therapy Progress Note   Dates of reporting period  07/14/2019   to   08/13/2019   Patient Details  Name: Peter Moore MRN: [id number] Date of Birth: [birth year] Referring Provider (SLP): Dr. Manuella Ghazi   Encounter Date: 08/13/2019  End of Session - 08/13/19 1617    Visit Number  41    Number of Visits  61    Date for SLP Re-Evaluation  10/09/19    Authorization Type  Medicare    Authorization Time Period  07/14/2019    Authorization - Visit Number  10    Authorization - Number of Visits  10    SLP Start Time  1400    SLP Stop Time   1450    SLP Time Calculation (min)  50 min    Activity Tolerance  Patient tolerated treatment well       Past Medical History:  Diagnosis Date  . Coronary artery disease   . Diabetes mellitus without complication ([place])   . Hypertension   . Myocardial infarction [facility])     Past Surgical History:  Procedure Laterality Date  . CARDIAC SURGERY    . TONSILLECTOMY      There were no vitals filed for this visit.  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  He received inpatient rehab at [facility], several outpatient sessions at [facility], and home health therapy (not clear when this ended).       Treatment Provided   Treatment provided  Cognitive-Linquistic      Pain Assessment   Pain Assessment  No/denies pain      Cognitive-Linquistic Treatment   Treatment focused on  Aphasia    Skilled Treatment  Patient sequenced his address using written cues with 100% accuracy, and read the address with 100% accuracy. He was  educated on strategies to utilize to correctly generate his address and utilized them with some success throughout the session. The patient and his wife participated in a barrier task. Patient was 100% accurate with expressive language and 90% accurate with receptive language. His wife demonstrated excellent cueing strategies and accepted gestural responses more willingly as compared to previous sessions. Throughout this task the patient was asked to generate his address. He was 60% accurate in generating the address without visual cues. The patient's mistakes were often the numerical values of the address and the state.  The patient was sent home with visual cue cards for practice.      Assessment / Recommendations / Plan   Plan  Continue with current plan of care      Progression Toward Goals   Progression toward goals  Progressing toward goals       SLP Education - 08/13/19 1616    Education Details  functional language, communication partner training    Person(s) Educated  Patient;Spouse    Methods  Explanation    Comprehension  Verbalized understanding         SLP Long Term Goals - 08/14/19 0923      SLP LONG  TERM GOAL #1   Title  Patient will complete semantic feature word finding tasks with 80% accuracy.    Status  Partially Met    Target Date  10/09/19      SLP LONG TERM GOAL #2   Title  Patient will generate grammatical and cogent sentence to complete simple/concrete linguistic task with 80% accuracy.    Status  Partially Met    Target Date  10/09/19      SLP LONG TERM GOAL #3   Title  Patient will complete 2 unit processing tasks with 80% accuracy without the need of repetition of task instructions or significant delays in responding.    Status  Partially Met    Target Date  10/09/19      SLP LONG TERM GOAL #4   Title  Patient will demonstrate reading comprehension for sentences with 80% accuracy.    Status  Partially Met    Target Date  10/09/19       Plan -  08/13/19 1617    Clinical Impression Statement  Patient's communication is highly functional but the patient is limited by frustration/negative emotion. He and his wife demonstrate improved partner communication with noted gradual acceptance of multi-modal communication strategies.    Speech Therapy Frequency  2x / week    Duration  Other (comment)    Treatment/Interventions  Multimodal communcation approach;Language facilitation;SLP instruction and feedback;Patient/family education;Cueing hierarchy;Compensatory techniques    Potential to Achieve Goals  Good    Potential Considerations  Ability to learn/carryover information;Pain level;Family/community support;Co-morbidities;Previous level of function;Cooperation/participation level;Severity of impairments    SLP Home Exercise Plan  Provided    Consulted and Agree with Plan of Care  Patient    Family Member Consulted  Wife Peter Moore)       Patient will benefit from skilled therapeutic intervention in order to improve the following deficits and impairments:   Aphasia    Problem List There are no problems to display for this patient.   [name] 08/14/2019, 9:24 AM  [place] MAIN [facility] SERVICES [address] [place], [place], [address] Phone: [phone]   Fax:  [phone]   Name: Peter Moore MRN: [id number] Date of Birth: [birth year]

## 2019-08-18 ENCOUNTER — Ambulatory Visit: Payer: Medicare HMO | Admitting: Speech Pathology

## 2019-08-20 ENCOUNTER — Encounter: Payer: Medicare HMO | Admitting: Speech Pathology

## 2019-08-24 ENCOUNTER — Ambulatory Visit: Payer: Medicare HMO | Admitting: Speech Pathology

## 2019-08-24 ENCOUNTER — Encounter: Payer: Self-pay | Admitting: Speech Pathology

## 2019-08-24 ENCOUNTER — Other Ambulatory Visit: Payer: Self-pay

## 2019-08-24 DIAGNOSIS — R4701 Aphasia: Secondary | ICD-10-CM | POA: Diagnosis not present

## 2019-08-24 NOTE — Therapy (Signed)
[place] [facility] MAIN REHAB SERVICES [address] , [place], [address] Phone: [phone]   Fax:  [phone]  Speech Language Pathology Treatment  Patient Details  Name: Peter Moore MRN: [id number] Date of Birth: 07/26/1940 Referring Provider (SLP): Dr. Shah   Encounter Date: 08/24/2019  End of Session - 08/24/19 1239    Visit Number  42    Number of Visits  56    Date for SLP Re-Evaluation  10/09/19    Authorization Type  Medicare    Authorization Time Period  08/24/2019    Authorization - Visit Number  1    Authorization - Number of Visits  10    SLP Start Time  1400    SLP Stop Time   1450    SLP Time Calculation (min)  50 min    Activity Tolerance  Patient tolerated treatment well       Past Medical History:  Diagnosis Date  . Coronary artery disease   . Diabetes mellitus without complication ([facility])   . Hypertension   . Myocardial infarction ([facility])     Past Surgical History:  Procedure Laterality Date  . CARDIAC SURGERY    . TONSILLECTOMY      There were no vitals filed for this visit.  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  He received inpatient rehab at [facility], several outpatient sessions at [facility], and home health therapy (not clear when this ended).       Treatment Provided   Treatment provided  Cognitive-Linquistic      Cognitive-Linquistic Treatment   Treatment focused on  Aphasia    Skilled Treatment  In a barrier task, patient was 100% effective in his expressive communicatiion. He was 100% accurate in receptive language given cues from his wife. She demonstrated improved semantic cueing. Patient given a pretend menu and asked to pretend to place an order. He required cues  from clinician and wife to completely answer the question. Patient was 73% accurate answering questions about the menu. He independently placed an order with no cues after reading through the entire menu and answering questions.      Assessment / Recommendations / Plan   Plan  Continue with current plan of care      Progression Toward Goals   Progression toward goals  Progressing toward goals       SLP Education - 08/24/19 1238    Education Details  functional language, communication partner training    Person(s) Educated  Patient;Spouse    Methods  Explanation    Comprehension  Verbalized understanding         SLP Long Term Goals - 08/14/19 0923      SLP LONG TERM GOAL #1   Title  Patient will complete semantic feature word finding tasks with 80% accuracy.    Status  Partially Met    Target Date  10/09/19      SLP LONG TERM GOAL #2   Title  Patient will generate grammatical and cogent sentence to complete simple/concrete linguistic task with 80% accuracy.    Status  Partially Met    Target Date  10/09/19      SLP   LONG TERM GOAL #3   Title  Patient will complete 2 unit processing tasks with 80% accuracy without the need of repetition of task instructions or significant delays in responding.    Status  Partially Met    Target Date  10/09/19      SLP LONG TERM GOAL #4   Title  Patient will demonstrate reading comprehension for sentences with 80% accuracy.    Status  Partially Met    Target Date  10/09/19       Plan - 08/24/19 1239    Clinical Impression Statement  Patient's communication is highly functional but the patient is limited by frustration/negative emotion. He and his wife demonstrate improved partner communication with noted gradual acceptance of multi-modal communication strategies. Patient's wife reports that their communication outside of the therapy room still feels limited.   Speech Therapy Frequency  2x / week    Duration  Other (comment)     Treatment/Interventions  Multimodal communcation approach;Language facilitation;SLP instruction and feedback;Patient/family education;Cueing hierarchy;Compensatory techniques    Potential to Achieve Goals  Good    Potential Considerations  Ability to learn/carryover information;Pain level;Family/community support;Co-morbidities;Previous level of function;Cooperation/participation level;Severity of impairments    SLP Home Exercise Plan  Provided    Consulted and Agree with Plan of Care  Patient    Family Member Consulted  Wife (Linda)       Patient will benefit from skilled therapeutic intervention in order to improve the following deficits and impairments:   Aphasia    Problem List There are no problems to display for this patient.   Abernathy, Susie 08/24/2019, 2:44 PM  [place] [facility] MAIN REHAB SERVICES [address] [place], , [address] Phone: [phone]   Fax:  [phone]   Name: Peter Moore MRN: [id number] Date of Birth: 10/21/1940 

## 2019-08-26 ENCOUNTER — Encounter: Payer: Self-pay | Admitting: Speech Pathology

## 2019-08-26 ENCOUNTER — Other Ambulatory Visit: Payer: Self-pay

## 2019-08-26 ENCOUNTER — Ambulatory Visit: Payer: Medicare HMO | Admitting: Speech Pathology

## 2019-08-26 DIAGNOSIS — R4701 Aphasia: Secondary | ICD-10-CM

## 2019-08-26 NOTE — Therapy (Signed)
[place] MAIN [facility] SERVICES [address] [place], [place], [address] Phone: [phone]   Fax:  [phone]  Speech Language Pathology Treatment  Patient Details  Name: Peter Moore MRN: [id number] Date of Birth: 1940/12/30 Referring Provider (SLP): Dr. Manuella Ghazi   Encounter Date: 08/26/2019  End of Session - 08/26/19 1545    Visit Number  43    Number of Visits  20    Date for SLP Re-Evaluation  10/09/19    Authorization Type  Medicare    Authorization Time Period  08/24/2019    Authorization - Visit Number  2    Authorization - Number of Visits  10    SLP Start Time  1400    SLP Stop Time   1450    SLP Time Calculation (min)  50 min    Activity Tolerance  Patient tolerated treatment well       Past Medical History:  Diagnosis Date  . Coronary artery disease   . Diabetes mellitus without complication ([place])   . Hypertension   . Myocardial infarction [facility])     Past Surgical History:  Procedure Laterality Date  . CARDIAC SURGERY    . TONSILLECTOMY      There were no vitals filed for this visit.  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  He received inpatient rehab at [facility], several outpatient sessions at [facility], and home health therapy (not clear when this ended).       Treatment Provided   Treatment provided  Cognitive-Linquistic      Cognitive-Linquistic Treatment   Treatment focused on  Aphasia    Skilled Treatment  Patient asked to spontaneously generate his address. He was unable to do so without visual cues. Patient generated complete address given visual cues. He wrote the address with 90% accuracy. At the end of the session, patient verbalized his address  and wrote it with 90% accuracy. 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 The patient utilized gestures to resolve communication breakdown on 4 different occassions. He also was noted to self-cue when unable to generate the exact word.       Assessment / Recommendations / Plan   Plan  Continue with current plan of care      Progression Toward Goals   Progression toward goals  Progressing toward goals       SLP Education - 08/26/19 1544    Education Details  functional language, communication partner training    Person(s) Educated  Patient;Spouse    Methods  Explanation    Comprehension  Verbalized understanding         SLP Long Term Goals - 08/14/19 0923      SLP LONG TERM GOAL #1   Title  Patient will complete semantic feature word finding tasks with 80% accuracy.    Status  Partially Met    Target Date  10/09/19      SLP LONG TERM GOAL #2   Title  Patient will generate grammatical and cogent sentence to complete simple/concrete linguistic task with 80% accuracy.  Status  Partially Met    Target Date  10/09/19      SLP LONG TERM GOAL #3   Title  Patient will complete 2 unit processing tasks with 80% accuracy without the need of repetition of task instructions or significant delays in responding.    Status  Partially Met    Target Date  10/09/19      SLP LONG TERM GOAL #4   Title  Patient will demonstrate reading comprehension for sentences with 80% accuracy.    Status  Partially Met    Target Date  10/09/19       Plan - 08/26/19 1546    Clinical Impression Statement  Patient's communication is highly functional but the patient is limited by frustration/negative emotion. He and his wife demonstrate improved partner communication with noted gradual acceptance of multi-modal communication strategies. Patient will benefit from therapy targetting utilization of word-finding strategies  within natural, real life contexts.    Speech Therapy Frequency  2x / week    Duration  Other (comment)    Treatment/Interventions  Multimodal communcation approach;Language facilitation;SLP instruction and feedback;Patient/family education;Cueing hierarchy;Compensatory techniques    Potential to Achieve Goals  Good    Potential Considerations  Ability to learn/carryover information;Pain level;Family/community support;Co-morbidities;Previous level of function;Cooperation/participation level;Severity of impairments    SLP Home Exercise Plan  Provided    Consulted and Agree with Plan of Care  Patient    Family Member Consulted  [name])       Patient will benefit from skilled therapeutic intervention in order to improve the following deficits and impairments:   Aphasia    Problem List There are no problems to display for this patient.   Samaria Anes 08/26/2019, 3:47 PM  [place] MAIN [facility] SERVICES [address] [place], [place], [address] Phone: [phone]   Fax:  [phone]   Name: Peter Moore MRN: [id number] Date of Birth: March 27, 1941

## 2019-09-02 ENCOUNTER — Ambulatory Visit: Payer: Medicare HMO | Admitting: Speech Pathology

## 2019-09-02 ENCOUNTER — Other Ambulatory Visit: Payer: Self-pay

## 2019-09-02 DIAGNOSIS — R4701 Aphasia: Secondary | ICD-10-CM | POA: Diagnosis not present

## 2019-09-03 ENCOUNTER — Encounter: Payer: Self-pay | Admitting: Speech Pathology

## 2019-09-03 NOTE — Therapy (Signed)
[place] MAIN [facility] SERVICES [address] [place], [place], [address] Phone: [phone]   Fax:  [phone]  Speech Language Pathology Treatment  Patient Details  Name: Peter Moore MRN: [id number] Date of Birth: 27-Oct-1940 Referring Provider (SLP): Dr. Manuella Ghazi   Encounter Date: 09/02/2019  End of Session - 09/03/19 0846    Visit Number  52    Number of Visits  68    Date for SLP Re-Evaluation  10/09/19    Authorization Type  Medicare    Authorization Time Period  08/24/2019    Authorization - Visit Number  3    Authorization - Number of Visits  10    SLP Start Time  1500    SLP Stop Time   1550    SLP Time Calculation (min)  50 min    Activity Tolerance  Patient tolerated treatment well       Past Medical History:  Diagnosis Date  . Coronary artery disease   . Diabetes mellitus without complication ([place])   . Hypertension   . Myocardial infarction [facility])     Past Surgical History:  Procedure Laterality Date  . CARDIAC SURGERY    . TONSILLECTOMY      There were no vitals filed for this visit.  Subjective Assessment - 09/03/19 0843    Subjective  Patient reports waking up feeling "very bad" the day prior with some difficulty walking - needed his wife to help him walk. The "very bad" feeling persisted all day. Patient reported feeling fine the day services were recieved.    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  He received inpatient rehab at [facility], several outpatient sessions at [facility], and home health therapy (not clear when this ended).       Treatment Provided   Treatment provided  Cognitive-Linquistic      Cognitive-Linquistic Treatment   Treatment focused on  Aphasia    Skilled Treatment  The patient and his wife were prompted to  communicate about ADLs utilizing strategies previously learned in therapy sessions. The patient and his wife were successful on two attempts to communicate about events that happened the day prior and communicate about plans for eating out/eating in for dinner utilizing written words, gestures, semantic and phonemic cues.     Assessment / Recommendations / Plan   Plan  Continue with current plan of care      Progression Toward Goals   Progression toward goals  Progressing toward goals       SLP Education - 09/03/19 0846    Education Details  functional language, communication partner training    Person(s) Educated  Patient;Spouse    Methods  Explanation;Demonstration    Comprehension  Verbalized understanding         SLP Long Term Goals - 08/14/19 0923      SLP LONG TERM GOAL #1   Title  Patient will complete semantic feature word finding tasks with 80% accuracy.    Status  Partially Met    Target Date  10/09/19      SLP LONG TERM GOAL #2   Title  Patient will generate grammatical and cogent sentence to complete simple/concrete linguistic task with 80% accuracy.  Status  Partially Met    Target Date  10/09/19      SLP LONG TERM GOAL #3   Title  Patient will complete 2 unit processing tasks with 80% accuracy without the need of repetition of task instructions or significant delays in responding.    Status  Partially Met    Target Date  10/09/19      SLP LONG TERM GOAL #4   Title  Patient will demonstrate reading comprehension for sentences with 80% accuracy.    Status  Partially Met    Target Date  10/09/19       Plan - 09/03/19 0847    Clinical Impression Statement  Patient's communication is highly functional but the patient is limited by frustration/negative emotion. He and his wife demonstrate improved partner communication with noted gradual acceptance of multi-modal communication strategies. Patient will benefit from therapy targetting utilization of word-finding  strategies within natural, real life contexts. The patient also reported significant difficulty walking the day prior, the patient and his wife were urged to communicate with their PCP regarding this episode.    Speech Therapy Frequency  2x / week    Duration  Other (comment)    Treatment/Interventions  Multimodal communcation approach;Language facilitation;SLP instruction and feedback;Patient/family education;Cueing hierarchy;Compensatory techniques    Potential to Achieve Goals  Good    Potential Considerations  Ability to learn/carryover information;Pain level;Family/community support;Co-morbidities;Previous level of function;Cooperation/participation level;Severity of impairments    SLP Home Exercise Plan  Provided    Consulted and Agree with Plan of Care  Patient    Family Member Consulted  [name])       Patient will benefit from skilled therapeutic intervention in order to improve the following deficits and impairments:   Aphasia    Problem List There are no problems to display for this patient.   Vibha Ferdig 09/03/2019, 8:48 AM  [place] MAIN [facility] SERVICES [address] [place], [place], [address] Phone: [phone]   Fax:  [phone]   Name: Peter Moore MRN: [id number] Date of Birth: 10/15/1940

## 2019-09-10 ENCOUNTER — Ambulatory Visit: Payer: Medicare HMO | Admitting: Speech Pathology

## 2019-09-10 ENCOUNTER — Other Ambulatory Visit: Payer: Self-pay

## 2019-09-10 DIAGNOSIS — R4701 Aphasia: Secondary | ICD-10-CM | POA: Diagnosis not present

## 2019-09-11 NOTE — Therapy (Signed)
[place] MAIN [facility] SERVICES [address] [place], [place], [address] Phone: [phone]   Fax:  [phone]  Speech Language Pathology Treatment/ DISCHARGE Summary  Patient Details  Name: Peter Moore MRN: [id number] Date of Birth: Apr 23, 1941 Referring Provider (SLP): Dr. Manuella Ghazi   Encounter Date: 09/10/2019  End of Session - 09/11/19 0855    Visit Number  45    Number of Visits  64    Date for SLP Re-Evaluation  10/09/19    Authorization Type  Medicare    Authorization Time Period  08/24/2019    Authorization - Visit Number  4    Authorization - Number of Visits  10    SLP Start Time  1300    SLP Stop Time   1350    SLP Time Calculation (min)  50 min    Activity Tolerance  Patient tolerated treatment well       Past Medical History:  Diagnosis Date  . Coronary artery disease   . Diabetes mellitus without complication ([place])   . Hypertension   . Myocardial infarction [facility])     Past Surgical History:  Procedure Laterality Date  . CARDIAC SURGERY    . TONSILLECTOMY      There were no vitals filed for this visit.  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  He received inpatient rehab at [facility], several outpatient sessions at [facility], and home health therapy (not clear when this ended).       Cognitive-Linquistic Treatment   Treatment focused on  Aphasia    Skilled Treatment  The patient and his wife were prompted to communicate about ADLs utilizing strategies previously learned in therapy sessions. The patient was accurate in production of 10 repetitions of his addresses given max cues and faded  to no cues. The patient was 80% accurate in recitation of his family members names and sent home with strategies for how to practice their names. Despite progress in session, pt states that speech therapy is "just too much" right now. He wishes to stop Outpatient Speech Therapy. Education provided to pt and his wife on pt's progress and value of skilled ST to pt's continued progress. Despite this, pt and his wife are discontinuing therpay and will request another referral when appropriate.       Assessment / Recommendations / Plan   Plan  Discharge SLP treatment due to (comment)   Pt and wife wish to discontinue skilled ST.     General Recommendations   Follow up Recommendations  --   instructed to make appt with pt's MD     Progression Toward Goals   Progression toward goals  --   N/A d/t discharge      SLP Education - 09/11/19 0855    Education Details  functional language, communication partner training    Person(s) Educated  Patient;Spouse    Methods  Explanation;Demonstration    Comprehension  Verbalized understanding         SLP Long Term Goals - 09/11/19 1135  SLP LONG TERM GOAL #1   Title  Patient will complete semantic feature word finding tasks with 80% accuracy.    Status  Not Met   d/t request to d/c ST services by pt     SLP LONG TERM GOAL #2   Title  Patient will generate grammatical and cogent sentence to complete simple/concrete linguistic task with 80% accuracy.    Status  Not Met   d/t request to d/c ST services by pt     SLP LONG TERM GOAL #3   Title  Patient will complete 2 unit processing tasks with 80% accuracy without the need of repetition of task instructions or significant delays in responding.    Status  Not Met   d/t request to d/c ST services by pt     SLP LONG TERM GOAL #4   Title  Patient will demonstrate reading comprehension for sentences with 80% accuracy.    Status  Not Met   d/t request to d/c ST services by pt      Plan -  09/11/19 0856    Clinical Impression Statement  Pt continues to use multi-modal communication (words and gestures) to communicate frustration with his aphasia. Despite this, pt and his wife have demonstrated increased partner communication. Most recently, pt has experienced increased difficulty and frustration with communicating wants/needs. He has appeared overwhelmed. Education has been provided on aphasia recovery and support provided. Specifically, pt and wife were instructed to make an appointment with pt's MD for further assessment of deficits and to help with pt's emotional state. At this time, skilled ST services will be discontinued d/t pt request.    Speech Therapy Frequency  --   N/A   Duration  Other (comment)   N/A   Treatment/Interventions  Multimodal communication approach;Language facilitation;SLP instruction and feedback;Patient/family education;Cueing hierarchy;Compensatory techniques    Potential to Achieve Goals  Good    Potential Considerations  Ability to learn/carryover information;Pain level;Family/community support;Co-morbidities;Previous level of function;Cooperation/participation level;Severity of impairments    SLP Home Exercise Plan  Provided    Consulted and Agree with Plan of Care  Patient;Family member/caregiver    Family Member Consulted  [name])        Mayra Brahm 09/11/2019, 11:42 AM  [place] MAIN [facility] SERVICES [address] [place], [place], [address] Phone: [phone]   Fax:  [phone]   Name: Kavontae Pritchard MRN: [id number] Date of Birth: 1941/04/22

## 2019-09-11 NOTE — Therapy (Deleted)
[place] MAIN [facility] SERVICES [address] [place], [place], [address] Phone: [phone]   Fax:  [phone]  Speech Language Pathology Treatment  Patient Details  Name: Peter Moore MRN: [id number] Date of Birth: 03-16-1941 Referring Provider (SLP): Dr. Manuella Ghazi   Encounter Date: 09/10/2019  End of Session - 09/11/19 0855    Visit Number  45    Number of Visits  53    Date for SLP Re-Evaluation  10/09/19    Authorization Type  Medicare    Authorization Time Period  08/24/2019    Authorization - Visit Number  4    Authorization - Number of Visits  10    SLP Start Time  1300    SLP Stop Time   1350    SLP Time Calculation (min)  50 min    Activity Tolerance  Patient tolerated treatment well       Past Medical History:  Diagnosis Date  . Coronary artery disease   . Diabetes mellitus without complication ([place])   . Hypertension   . Myocardial infarction [facility])     Past Surgical History:  Procedure Laterality Date  . CARDIAC SURGERY    . TONSILLECTOMY      There were no vitals filed for this visit.  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  He received inpatient rehab at [facility], several outpatient sessions at [facility], and home health therapy (not clear when this ended).       Cognitive-Linquistic Treatment   Treatment focused on  Aphasia    Skilled Treatment  The patient and his wife were prompted to communicate about ADLs utilizing strategies previously learned in therapy sessions. The patient was accurate in production of 10 repetitions of his addresses given max cues and faded to no cues. The patient was 80%  accurate in recitation of his family members names and sent home with strategies for how to practice their names.  Patient educated on value of speech therapy.     Assessment / Recommendations / Plan   Plan  Discharge SLP treatment due to (comment)   Pateient wishes to discontinue services for the time being.     Progression Toward Goals   Progression toward goals  Not progressing toward goals (comment)   Patient wishes to discontinue SLP      SLP Education - 09/11/19 0855    Education Details  functional language, communication partner training    Person(s) Educated  Patient;Spouse    Methods  Explanation;Demonstration    Comprehension  Verbalized understanding         SLP Long Term Goals - 08/14/19 0923      SLP LONG TERM GOAL #1   Title  Patient will complete semantic feature word finding tasks with 80% accuracy.    Status  Partially Met    Target Date  10/09/19      SLP LONG TERM GOAL #2   Title  Patient will generate grammatical and cogent sentence to complete simple/concrete linguistic task with 80% accuracy.    Status  Partially Met  Target Date  10/09/19      SLP LONG TERM GOAL #3   Title  Patient will complete 2 unit processing tasks with 80% accuracy without the need of repetition of task instructions or significant delays in responding.    Status  Partially Met    Target Date  10/09/19      SLP LONG TERM GOAL #4   Title  Patient will demonstrate reading comprehension for sentences with 80% accuracy.    Status  Partially Met    Target Date  10/09/19       Plan - 09/11/19 0856    Clinical Impression Statement  The patient reports that he is having significant word-finding diffculty as of recent and feels that speech therapy is "too much" and he is frustrated by lack of progress. He and his wife demonstrate improved partner communication with noted gradual acceptance of multi-modal communication strategies, however, as of recent these strategies have not been  as effective. The patient reports the increase in word-finding difficulty had an onset of a few weeks ago, and has not resolved. Patient was urged to communicate with his PCP again.   Speech Therapy Frequency  2x / week    Duration  Other (comment)    Treatment/Interventions  Multimodal communcation approach;Language facilitation;SLP instruction and feedback;Patient/family education;Cueing hierarchy;Compensatory techniques    Potential to Achieve Goals  Good    Potential Considerations  Ability to learn/carryover information;Pain level;Family/community support;Co-morbidities;Previous level of function;Cooperation/participation level;Severity of impairments    SLP Home Exercise Plan  Provided    Consulted and Agree with Plan of Care  Patient    Family Member Consulted  [name])       Patient will benefit from skilled therapeutic intervention in order to improve the following deficits and impairments:   Aphasia    Problem List There are no problems to display for this patient.   Peter Moore 09/11/2019, 9:03 AM  [place] MAIN [facility] SERVICES [address] [place], [place], [address] Phone: [phone]   Fax:  [phone]   Name: Peter Moore MRN: [id number] Date of Birth: 1940/09/22

## 2019-09-15 ENCOUNTER — Ambulatory Visit: Payer: Medicare HMO | Admitting: Speech Pathology

## 2019-09-17 ENCOUNTER — Encounter: Payer: Medicare HMO | Admitting: Speech Pathology

## 2019-09-22 ENCOUNTER — Encounter: Payer: Medicare HMO | Admitting: Speech Pathology

## 2019-09-29 ENCOUNTER — Encounter: Payer: Medicare HMO | Admitting: Speech Pathology

## 2019-10-01 ENCOUNTER — Encounter: Payer: Medicare HMO | Admitting: Speech Pathology

## 2019-10-06 ENCOUNTER — Encounter: Payer: Medicare HMO | Admitting: Speech Pathology

## 2019-10-08 ENCOUNTER — Encounter: Payer: Medicare HMO | Admitting: Speech Pathology

## 2019-10-13 ENCOUNTER — Encounter: Payer: Medicare HMO | Admitting: Speech Pathology

## 2019-10-14 ENCOUNTER — Encounter: Payer: Medicare HMO | Admitting: Speech Pathology

## 2019-10-15 ENCOUNTER — Encounter: Payer: Medicare HMO | Admitting: Speech Pathology

## 2020-03-01 ENCOUNTER — Emergency Department: Payer: Medicare HMO

## 2020-03-01 ENCOUNTER — Inpatient Hospital Stay
Admission: EM | Admit: 2020-03-01 | Discharge: 2020-03-03 | DRG: 101 | Disposition: A | Discharge status: Home / Self Care | Payer: Medicare HMO | Attending: Internal Medicine | Admitting: Internal Medicine

## 2020-03-01 ENCOUNTER — Inpatient Hospital Stay (HOSPITAL_COMMUNITY)
Admit: 2020-03-01 | Discharge: 2020-03-01 | Disposition: A | Discharge status: Home / Self Care | Payer: Medicare HMO | Attending: Internal Medicine | Admitting: Internal Medicine

## 2020-03-01 ENCOUNTER — Inpatient Hospital Stay: Payer: Medicare HMO

## 2020-03-01 DIAGNOSIS — I2510 Atherosclerotic heart disease of native coronary artery without angina pectoris: Secondary | ICD-10-CM | POA: Diagnosis present

## 2020-03-01 DIAGNOSIS — E119 Type 2 diabetes mellitus without complications: Secondary | ICD-10-CM | POA: Diagnosis present

## 2020-03-01 DIAGNOSIS — Z20822 Contact with and (suspected) exposure to covid-19: Secondary | ICD-10-CM | POA: Diagnosis present

## 2020-03-01 DIAGNOSIS — I69320 Aphasia following cerebral infarction: Secondary | ICD-10-CM

## 2020-03-01 DIAGNOSIS — Z7984 Long term (current) use of oral hypoglycemic drugs: Secondary | ICD-10-CM | POA: Diagnosis not present

## 2020-03-01 DIAGNOSIS — F32A Depression, unspecified: Secondary | ICD-10-CM

## 2020-03-01 DIAGNOSIS — R32 Unspecified urinary incontinence: Secondary | ICD-10-CM | POA: Diagnosis present

## 2020-03-01 DIAGNOSIS — Z79899 Other long term (current) drug therapy: Secondary | ICD-10-CM | POA: Diagnosis not present

## 2020-03-01 DIAGNOSIS — Z7982 Long term (current) use of aspirin: Secondary | ICD-10-CM

## 2020-03-01 DIAGNOSIS — I252 Old myocardial infarction: Secondary | ICD-10-CM

## 2020-03-01 DIAGNOSIS — I6529 Occlusion and stenosis of unspecified carotid artery: Secondary | ICD-10-CM | POA: Diagnosis present

## 2020-03-01 DIAGNOSIS — R29810 Facial weakness: Secondary | ICD-10-CM | POA: Diagnosis present

## 2020-03-01 DIAGNOSIS — I350 Nonrheumatic aortic (valve) stenosis: Secondary | ICD-10-CM

## 2020-03-01 DIAGNOSIS — I361 Nonrheumatic tricuspid (valve) insufficiency: Secondary | ICD-10-CM | POA: Diagnosis not present

## 2020-03-01 DIAGNOSIS — Z888 Allergy status to other drugs, medicaments and biological substances status: Secondary | ICD-10-CM | POA: Diagnosis not present

## 2020-03-01 DIAGNOSIS — F17210 Nicotine dependence, cigarettes, uncomplicated: Secondary | ICD-10-CM | POA: Diagnosis present

## 2020-03-01 DIAGNOSIS — R569 Unspecified convulsions: Principal | ICD-10-CM

## 2020-03-01 DIAGNOSIS — I10 Essential (primary) hypertension: Secondary | ICD-10-CM | POA: Diagnosis present

## 2020-03-01 DIAGNOSIS — I639 Cerebral infarction, unspecified: Secondary | ICD-10-CM

## 2020-03-01 DIAGNOSIS — I6389 Other cerebral infarction: Secondary | ICD-10-CM

## 2020-03-01 LAB — COMPREHENSIVE METABOLIC PANEL
ALT: 12 U/L (ref 0–44)
AST: 21 U/L (ref 15–41)
Albumin: 4 g/dL (ref 3.5–5.0)
Alkaline Phosphatase: 43 U/L (ref 38–126)
Anion gap: 13 (ref 5–15)
BUN: 18 mg/dL (ref 8–23)
CO2: 26 mmol/L (ref 22–32)
Calcium: 9.4 mg/dL (ref 8.9–10.3)
Chloride: 100 mmol/L (ref 98–111)
Creatinine, Ser: 1.41 mg/dL — ABNORMAL HIGH (ref 0.61–1.24)
GFR, Estimated: 48 mL/min — ABNORMAL LOW (ref >60)
Glucose, Bld: 159 mg/dL — ABNORMAL HIGH (ref 70–99)
Potassium: 3.5 mmol/L (ref 3.5–5.1)
Sodium: 139 mmol/L (ref 135–145)
Total Bilirubin: 0.8 mg/dL (ref 0.3–1.2)
Total Protein: 7.3 g/dL (ref 6.5–8.1)

## 2020-03-01 LAB — ECHOCARDIOGRAM COMPLETE
AR max vel: 1.19 cm2
AV Area VTI: 1.42 cm2
AV Area mean vel: 1.19 cm2
AV Mean grad: 16 mmHg
AV Peak grad: 26.2 mmHg
Ao pk vel: 2.56 m/s
Area-P 1/2: 2.67 cm2
S' Lateral: 2.13 cm

## 2020-03-01 LAB — DIFFERENTIAL
Abs Immature Granulocytes: 0.02 10*3/uL (ref 0.00–0.07)
Basophils Absolute: 0.1 10*3/uL (ref 0.0–0.1)
Basophils Relative: 1 %
Eosinophils Absolute: 0.2 10*3/uL (ref 0.0–0.5)
Eosinophils Relative: 2 %
Immature Granulocytes: 0 %
Lymphocytes Relative: 19 %
Lymphs Abs: 1.9 10*3/uL (ref 0.7–4.0)
Monocytes Absolute: 0.7 10*3/uL (ref 0.1–1.0)
Monocytes Relative: 7 %
Neutro Abs: 7.4 10*3/uL (ref 1.7–7.7)
Neutrophils Relative %: 71 %

## 2020-03-01 LAB — URINALYSIS, ROUTINE W REFLEX MICROSCOPIC
Bilirubin Urine: NEGATIVE
Glucose, UA: NEGATIVE mg/dL
Hgb urine dipstick: NEGATIVE
Ketones, ur: NEGATIVE mg/dL
Leukocytes,Ua: NEGATIVE
Nitrite: NEGATIVE
Protein, ur: NEGATIVE mg/dL
Specific Gravity, Urine: 1.014 (ref 1.005–1.030)
pH: 7 (ref 5.0–8.0)

## 2020-03-01 LAB — CBC
HCT: 41.3 % (ref 39.0–52.0)
Hemoglobin: 13.6 g/dL (ref 13.0–17.0)
MCH: 28.8 pg (ref 26.0–34.0)
MCHC: 32.9 g/dL (ref 30.0–36.0)
MCV: 87.3 fL (ref 80.0–100.0)
Platelets: 243 10*3/uL (ref 150–400)
RBC: 4.73 MIL/uL (ref 4.22–5.81)
RDW: 13.7 % (ref 11.5–15.5)
WBC: 10.4 10*3/uL (ref 4.0–10.5)
nRBC: 0 % (ref 0.0–0.2)

## 2020-03-01 LAB — GLUCOSE, CAPILLARY
Glucose-Capillary: 143 mg/dL — ABNORMAL HIGH (ref 70–99)
Glucose-Capillary: 83 mg/dL (ref 70–99)
Glucose-Capillary: 84 mg/dL (ref 70–99)

## 2020-03-01 LAB — ETHANOL: Alcohol, Ethyl (B): <10 mg/dL (ref <10)

## 2020-03-01 LAB — RESPIRATORY PANEL BY RT PCR (FLU A&B, COVID)
Influenza A by PCR: NEGATIVE
Influenza B by PCR: NEGATIVE
SARS Coronavirus 2 by RT PCR: NEGATIVE

## 2020-03-01 LAB — PROTIME-INR
INR: 1 (ref 0.8–1.2)
Prothrombin Time: 12.8 seconds (ref 11.4–15.2)

## 2020-03-01 LAB — APTT: aPTT: 29 seconds (ref 24–36)

## 2020-03-01 MED ORDER — VALPROATE SODIUM 500 MG/5ML IV SOLN
1600.0000 mg | Freq: Once | INTRAVENOUS | Status: AC
  Administered 2020-03-01: 1600 mg via INTRAVENOUS
  Filled 2020-03-01: qty 16

## 2020-03-01 MED ORDER — SERTRALINE HCL 50 MG PO TABS
25.0000 mg | ORAL_TABLET | Freq: Every day | ORAL | Status: DC
  Administered 2020-03-02 – 2020-03-03 (×2): 25 mg via ORAL
  Filled 2020-03-01 (×2): qty 1

## 2020-03-01 MED ORDER — LORAZEPAM BOLUS VIA INFUSION: 2.0000 mg | Freq: Four times a day (QID) | INTRAVENOUS | Status: DC | PRN

## 2020-03-01 MED ORDER — ASPIRIN 300 MG RE SUPP
300.0000 mg | Freq: Every day | RECTAL | Status: DC
  Filled 2020-03-01: qty 1

## 2020-03-01 MED ORDER — SODIUM CHLORIDE 0.9 % IV SOLN: INTRAVENOUS | Status: DC

## 2020-03-01 MED ORDER — ATORVASTATIN CALCIUM 20 MG PO TABS
40.0000 mg | ORAL_TABLET | Freq: Every day | ORAL | Status: DC
  Administered 2020-03-01 – 2020-03-02 (×2): 40 mg via ORAL
  Filled 2020-03-01 (×2): qty 2

## 2020-03-01 MED ORDER — ACETAMINOPHEN 325 MG PO TABS: 650.0000 mg | ORAL_TABLET | Freq: Four times a day (QID) | ORAL | Status: DC | PRN

## 2020-03-01 MED ORDER — LORAZEPAM 2 MG/ML IJ SOLN: 2.0000 mg | Freq: Four times a day (QID) | INTRAMUSCULAR | Status: DC | PRN

## 2020-03-01 MED ORDER — STROKE: EARLY STAGES OF RECOVERY BOOK: Freq: Once | Status: DC

## 2020-03-01 MED ORDER — ASPIRIN 81 MG PO CHEW
325.0000 mg | CHEWABLE_TABLET | Freq: Every day | ORAL | Status: DC
  Administered 2020-03-01 – 2020-03-03 (×3): 325 mg via ORAL
  Filled 2020-03-01 (×3): qty 5

## 2020-03-01 MED ORDER — VALPROIC ACID 250 MG PO CAPS
500.0000 mg | ORAL_CAPSULE | Freq: Three times a day (TID) | ORAL | Status: DC
  Administered 2020-03-02 – 2020-03-03 (×4): 500 mg via ORAL
  Filled 2020-03-01 (×9): qty 2

## 2020-03-01 MED ORDER — INSULIN ASPART 100 UNIT/ML ~~LOC~~ SOLN
0.0000 [IU] | SUBCUTANEOUS | Status: DC
  Administered 2020-03-01 – 2020-03-02 (×2): 2 [IU] via SUBCUTANEOUS
  Filled 2020-03-01 (×2): qty 1

## 2020-03-01 MED ORDER — LORATADINE 10 MG PO TABS
10.0000 mg | ORAL_TABLET | Freq: Every day | ORAL | Status: DC
  Administered 2020-03-02 – 2020-03-03 (×2): 10 mg via ORAL
  Filled 2020-03-01 (×2): qty 1

## 2020-03-01 NOTE — ED Provider Notes (Signed)
[facility] Emergency Department Provider Note   ____________________________________________   First MD Initiated Contact with Patient 03/01/20 [phone]     (approximate)  I have reviewed the triage vital signs and the nursing notes.   HISTORY  Chief Complaint Seizures and Altered Mental Status  EM caveat: Confusion, expressive aphasia  HPI Peter Moore is a 79 y.o. male here for evaluation after he was noted abruptly at about 730 this morning to call out to his wife, she then witnessed him having a "stiff shaking" and confusion thereafter.  EMS reports they arrived and patient is thought to have had seizure-like activity and I would also bit his tongue and urinated on himself.  He initially had a slight right facial droop but that has resolved.  He has a history of previous stroke and some expressive aphasia according to his wife  He has not been recently ill according to wife.  He was in his normal health last night and he got up made himself coffee and she believes was acting quite normally until she called out to him at about 730  No history of seizure   Past Medical History:  Diagnosis Date  . Coronary artery disease   . Diabetes mellitus without complication (HCC)   . Hypertension   . Myocardial infarction [facility])     Patient Active Problem List   Diagnosis Date Noted  . Seizure (HCC) 03/01/2020    Past Surgical History:  Procedure Laterality Date  . CARDIAC SURGERY    . TONSILLECTOMY      Prior to Admission medications   Medication Sig Start Date End Date Taking? Authorizing Provider  acetaminophen (TYLENOL) 325 MG tablet Take 650 mg by mouth every 6 (six) hours as needed for pain or fever. 09/05/18  Yes [provider]  aspirin EC 81 MG tablet Take 81 mg by mouth daily.   Yes [provider]  atorvastatin (LIPITOR) 40 MG tablet Take 40 mg by mouth at bedtime. 02/17/20  Yes [provider]  cetirizine (ZYRTEC) 10  MG tablet Take 10 mg by mouth 2 (two) times daily.   Yes [provider]  hydrochlorothiazide (HYDRODIURIL) 25 MG tablet Take 25 mg by mouth daily. 01/28/20  Yes [provider]  losartan (COZAAR) 100 MG tablet Take 100 mg by mouth daily.   Yes [provider]  metFORMIN (GLUCOPHAGE) 500 MG tablet Take 500 mg by mouth 2 (two) times daily with a meal. 09/16/18  Yes [provider]  sertraline (ZOLOFT) 25 MG tablet Take 25 mg by mouth daily. 12/23/19  Yes [provider]    Allergies Clobetasol  No family history on file.  Social History Social History   Tobacco Use  . Smoking status: Current Every Day Smoker    Types: Cigarettes  . Smokeless tobacco: Never Used  Vaping Use  . Vaping Use: Never used  Substance Use Topics  . Alcohol use: No  . Drug use: Not on file    Review of Systems   EM caveat      ____________________________________________   PHYSICAL EXAM:  VITAL SIGNS: ED Triage Vitals [03/01/20 0857]  Enc Vitals Group     BP (!) 167/101     Pulse Rate 77     Resp 17     Temp 98.8 F (37.1 C)     Temp Source Oral     SpO2 97 %     Weight      Height  Head Circumference      Peak Flow      Pain Score      Pain Loc      Pain Edu?      Excl. in GC?     Constitutional: Alert and oriented to self has a hard time expressing things as his words and frequently get exchange. Well appearing and in no acute distress. Eyes: Conjunctivae are normal. Head: Atraumatic. Nose: No congestion/rhinnorhea. Mouth/Throat: Mucous membranes are moist. Neck: No stridor.  Cardiovascular: Normal rate, regular rhythm. Grossly normal heart sounds.  Good peripheral circulation. Respiratory: Normal respiratory effort.  No retractions. Lungs CTAB. Gastrointestinal: Soft and nontender. No distention. Musculoskeletal: No lower extremity tenderness nor edema. Neurologic: When he speaks his speech is clear without dysarthria but he  has obvious expressive aphasia.  Follows commands and seems to have good understanding no gross focal neurologic deficits are appreciated except for expressive aphasia.  NIH equals 1.  No facial droop.  No unilateral weakness to noted on exam.  No pronator drift in extremity.  Normal sensation.  Stroke van scale negative has no noted motor weakness at this time Skin:  Skin is warm, dry and intact. No rash noted. Psychiatric: Mood and affect are normal. Speech and behavior are normal.  ____________________________________________   LABS (all labs ordered are listed, but only abnormal results are displayed)  Labs Reviewed  COMPREHENSIVE METABOLIC PANEL - Abnormal; Notable for the following components:      Result Value   Glucose, Bld 159 (*)    Creatinine, Ser 1.41 (*)    GFR, Estimated 48 (*)    All other components within normal limits  RESPIRATORY PANEL BY RT PCR (FLU A&B, COVID)  ETHANOL  PROTIME-INR  APTT  CBC  DIFFERENTIAL  URINALYSIS, ROUTINE W REFLEX MICROSCOPIC   ____________________________________________  EKG  Reviewed entered by me at 845 Heart rate 85 QRs 100 QTc 470 Sinus rhythm, no evidence of acute ischemia ____________________________________________  RADIOLOGY  CT HEAD WO CONTRAST  Result Date: 03/01/2020 CLINICAL DATA:  Seizure, abnormal neuro exam. EXAM: CT HEAD WITHOUT CONTRAST TECHNIQUE: Contiguous axial images were obtained from the base of the skull through the vertex without intravenous contrast. COMPARISON:  Head CT 01/23/2019. FINDINGS: Brain: Redemonstrated chronic left MCA territory cortically based infarcts within the left frontal, parietal and temporal lobes as well as left insula and subinsular region. Background mild generalized cerebral atrophy. There is no acute intracranial hemorrhage. No demarcated cortical infarct. No extra-axial fluid collection. No evidence of intracranial mass. No midline shift. Vascular: No hyperdense vessel.   Atherosclerotic calcifications. Skull: Normal. Negative for fracture or focal lesion. Sinuses/Orbits: Visualized orbits show no acute finding. Trace ethmoid sinus mucosal thickening. No significant mastoid effusion. IMPRESSION: No evidence of acute intracranial abnormality. Redemonstrated chronic left MCA territory cortically-based infarcts within the left frontal, parietal and temporal lobes as well as left insula/subinsular region. Background mild generalized cerebral atrophy, stable as compared to the head CT of 01/23/2019. Electronically Signed   By: Jackey Loge DO   On: 03/01/2020 09:40     CT of the head personally viewed and reviewed by me ____________________________________________   PROCEDURES  Procedure(s) performed: None  Procedures  Critical Care performed: Yes, see critical care note(s)  CRITICAL CARE Performed by: Sharyn Creamer   Total critical care time: 35 minutes  Critical care time was exclusive of separately billable procedures and treating other patients.  Critical care was necessary to treat or prevent imminent or life-threatening deterioration.  Critical  care was time spent personally by me on the following activities: development of treatment plan with patient and/or surrogate as well as nursing, discussions with consultants, evaluation of patient's response to treatment, examination of patient, obtaining history from patient or surrogate, ordering and performing treatments and interventions, ordering and review of laboratory studies, ordering and review of radiographic studies, pulse oximetry and re-evaluation of patient's condition.  Patient seen and evaluated emergently for concerns of possible neurologic deficit including a reported right-sided facial droop that has now resolved in association with tremulousness altered mental status and possible seizure-like activity.  Not actively seizing on arrival to ER, appears to be at his or close to his baseline though  somewhat confused he does have expressive aphasia as well.  Spoke with his wife gathered majority of history from her ____________________________________________   INITIAL IMPRESSION / ASSESSMENT AND PLAN / ED COURSE  Pertinent labs & imaging results that were available during my care of the patient were reviewed by me and considered in my medical decision making (see chart for details).   Patient presents for concerns of acute neurologic abnormality including right-sided facial droop that is now resolved, as well as possible seizure-like activity.  He has not a TPA candidate in consideration of his previous history as well as possible new onset seizure today I feel that this is unlikely to represent an acute stroke without is on the differential, but given his associated seizure-like activity and resolution of his deficits except for expressive aphasia which appears to be chronic  Clinical Course as of Mar 01 1029  Tue Mar 01, 2020  [id number] Consult placed via page and [facility] to Dr. Otelia Limes   [MQ]  [phone] Patient's labs normal CBC, normal INR, mildly elevated creatinine.  CT the head also reviewed by me personally, I see no evidence of acute intracranial hemorrhage.  He does have apparently what I suspect is likely an old large MCA type territory infarct.  Final read pending by radiologist   [MQ]    Clinical Course User Index [MQ] Sharyn Creamer, MD   ----------------------------------------- 9:43 AM on 03/01/2020 -----------------------------------------  Patient resting comfortably without distress.  ----------------------------------------- 10:30 AM on 03/01/2020 -----------------------------------------  Admitted to hospitalist service.  Neurology consult has been called, discussed with Dr. Otelia Limes.  Final recommendations pending.  ____________________________________________   FINAL CLINICAL IMPRESSION(S) / ED DIAGNOSES  Final diagnoses:  New onset seizure (HCC)        Note:   This document was prepared using Conservation officer, historic buildings and may include unintentional dictation errors       Sharyn Creamer, MD 03/01/20 1031

## 2020-03-01 NOTE — ED Notes (Signed)
Pt resting.  No complaints.

## 2020-03-01 NOTE — Consult Note (Addendum)
NEURO HOSPITALIST CONSULT NOTE   Requestig physician: Dr. Joylene Igo  Reason for Consult: New onset seizure  History obtained from:  Chart     HPI:                                                                                                                                          Peter Moore is an 79 y.o. 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 According to his wife he was normal when he got up around 0700 per his usual routine. At about 0730 he started yelling out for help. His wife noted that he was "acting funny" and started having "stiff board-like shaking". Upon EMS arrival the patient was no longer shaking but was disoriented x 4. He was noted to be incontinent and also had a small laceration on the right side of his tongue. CBG was 149 in the field. On arrival to the ED, the patient remained disoriented but was able to follow commands. EMS had noted a slight right facial droop but that had resolved at the time of his presentation to the ED. Neurology was consulted for new-onset seizure.  Past Medical History:  Diagnosis Date  . Coronary artery disease   . Diabetes mellitus without complication (HCC)   . Hypertension   . Myocardial infarction [facility])     Past Surgical History:  Procedure Laterality Date  . CARDIAC SURGERY    . TONSILLECTOMY      No family history on file.            Social History:  reports that he has been smoking cigarettes. He has never used smokeless tobacco. He reports that he does not drink alcohol. No history on file for drug use.  Allergies  Allergen Reactions  . Clobetasol Other (See Comments)    Chest pain, heartburn, increase blood sugar    MEDICATIONS:                                                                                                                     No current facility-administered  medications on file prior to encounter.   Current Outpatient Medications on  File Prior to Encounter  Medication Sig Dispense Refill  . acetaminophen (TYLENOL) 325 MG tablet Take 650 mg by mouth every 6 (six) hours as needed for pain or fever.    Marland Kitchen aspirin EC 81 MG tablet Take 81 mg by mouth daily.    Marland Kitchen atorvastatin (LIPITOR) 40 MG tablet Take 40 mg by mouth at bedtime.    . cetirizine (ZYRTEC) 10 MG tablet Take 10 mg by mouth 2 (two) times daily.    . hydrochlorothiazide (HYDRODIURIL) 25 MG tablet Take 25 mg by mouth daily.    Marland Kitchen losartan (COZAAR) 100 MG tablet Take 100 mg by mouth daily.    . metFORMIN (GLUCOPHAGE) 500 MG tablet Take 500 mg by mouth 2 (two) times daily with a meal.    . sertraline (ZOLOFT) 25 MG tablet Take 25 mg by mouth daily.      Scheduled: .  stroke: mapping our early stages of recovery book   Does not apply Once  . aspirin  300 mg Rectal Daily   Or  . aspirin  325 mg Oral Daily  . atorvastatin  40 mg Oral QHS  . insulin aspart  0-15 Units Subcutaneous Q4H  . [START ON 03/02/2020] loratadine  10 mg Oral Daily  . [START ON 03/02/2020] sertraline  25 mg Oral Daily   Continuous: . sodium chloride 10 mL/hr at 03/01/20 1131     ROS:                                                                                                                                       Unable to obtain due to dysphasia.    Blood pressure 135/81, pulse 74, temperature 98.8 F (37.1 C), temperature source Oral, resp. rate 16, SpO2 96 %.   General Examination:                                                                                                       Physical Exam  HEENT-  [place]/AT    Lungs- Respirations unlabored Extremities- No edema  Neurological Examination Mental Status: Awake and alert. Moderate expressive and mild receptive dysphasia. Speech is non-dysarthric. Has difficulty with orientation questions due to dysphasia.  Cranial Nerves: II: Visual fields intact  with no extinction to DSS. Pupils equal.  III,IV, VI: No ptosis. EOMI. No nystagmus.  V,VII: Face symmetric. Temp sensation equal bilaterally  VIII: hearing intact to voice IX,X: No hypophonia XI: Right sided weakness with shoulder shrug XII: Midline tongue  extension Motor: LUE and LLE 5/5 RUE 4+/5 with slight lag in movement relative to the left.  RLE 4+/5 No pronator drift Sensory: Temp and FT intact bilaterally to upper and lower extremities when tested individually. However, there is extinction to DSS on the right.  Deep Tendon Reflexes: 2+ and symmetric bilateral brachioradialis and biceps Plantars:  Right: Upgoing   Left: Downgoing Cerebellar: No ataxia with FNF bilaterally  Gait: Deferred   Lab Results: Basic Metabolic Panel: Recent Labs  Lab 03/01/20 0850  NA 139  K 3.5  CL 100  CO2 26  GLUCOSE 159*  BUN 18  CREATININE 1.41*  CALCIUM 9.4    CBC: Recent Labs  Lab 03/01/20 0850  WBC 10.4  NEUTROABS 7.4  HGB 13.6  HCT 41.3  MCV 87.3  PLT 243    Cardiac Enzymes: No results for input(s): CKTOTAL, CKMB, CKMBINDEX, TROPONINI in the last 168 hours.  Lipid Panel: No results for input(s): CHOL, TRIG, HDL, CHOLHDL, VLDL, LDLCALC in the last 168 hours.  Imaging: CT HEAD WO CONTRAST  Result Date: 03/01/2020 CLINICAL DATA:  Seizure, abnormal neuro exam. EXAM: CT HEAD WITHOUT CONTRAST TECHNIQUE: Contiguous axial images were obtained from the base of the skull through the vertex without intravenous contrast. COMPARISON:  Head CT 01/23/2019. FINDINGS: Brain: Redemonstrated chronic left MCA territory cortically based infarcts within the left frontal, parietal and temporal lobes as well as left insula and subinsular region. Background mild generalized cerebral atrophy. There is no acute intracranial hemorrhage. No demarcated cortical infarct. No extra-axial fluid collection. No evidence of intracranial mass. No midline shift. Vascular: No hyperdense vessel.   Atherosclerotic calcifications. Skull: Normal. Negative for fracture or focal lesion. Sinuses/Orbits: Visualized orbits show no acute finding. Trace ethmoid sinus mucosal thickening. No significant mastoid effusion. IMPRESSION: No evidence of acute intracranial abnormality. Redemonstrated chronic left MCA territory cortically-based infarcts within the left frontal, parietal and temporal lobes as well as left insula/subinsular region. Background mild generalized cerebral atrophy, stable as compared to the head CT of 01/23/2019. Electronically Signed   By: Jackey Loge DO   On: 03/01/2020 09:40    Assessment: 79 year old male with new-onset seizure.  1. CT head: No evidence of acute intracranial abnormality. Redemonstrated chronic left MCA territory cortically-based infarcts within the left frontal, parietal and temporal lobes as well as left insula/subinsular region. Background mild generalized cerebral atrophy, stable as compared to the head CT of 01/23/2019.  2. Exam reveals findings referable to the patient's chronic left MCA territory ischemic infarctions. No clinical seizure activity seen.  3. Given his history of stroke and the semiology described by his wife, the patient's presentation is most consistent with a first time seizure, most likely secondary to an epileptogenic focus from his old stroke.  4. MRI brain: Small subacute infarct of the left corona radiata. Chronic infarcts.  Recommendations: 1. Start valproic acid with IV load of 20 mg/kg (ordered). Continue with scheduled PO dosing at 500 mg TID (ordered).  2. EEG (ordered) 3. Ammonia level (ordered) 4. Inpatient seizure precautions.  5. Outpatient seizure precautions. Per [facility] statutes, patients with seizures are not allowed to drive until  they have been seizure-free for six months. Use caution when using heavy equipment or power tools. Avoid working on ladders or at heights. Take showers instead of baths. Ensure the water  temperature is not too high on the home water heater. Do not go swimming alone. When caring for infants or small children, sit down when holding,  feeding, or changing them to minimize risk of injury to the child in the event you have a seizure. Also, Maintain good sleep hygiene. Avoid alcohol. 6. Stroke recommendations: Add Plavix to ASA. Continue atorvastatin. TTE. Carotid ultrasound. MRA of head. PT/OT/Speech. Outpatient Stroke Neurology follow up. Permissive HTN x 24 hours.    Electronically signed: Dr. Caryl PinaEric Leontae Bostock 03/01/2020, 10:51 AM

## 2020-03-01 NOTE — Progress Notes (Signed)
PT Cancellation Note  Patient Details Name: Peter Moore MRN: [id number] DOB: 04-13-1941   Cancelled Treatment:    Reason Eval/Treat Not Completed: Active bedrest order.  PT order received and chart reviewed.  Pt noted to have active bed rest order. Will follow acutely and initiate services at later date/time as appropriate.   Nolon Bussing, PT, DPT 03/01/20, 2:46 PM

## 2020-03-01 NOTE — Progress Notes (Signed)
*  PRELIMINARY RESULTS* Echocardiogram 2D Echocardiogram has been performed.  Cristela Blue 03/01/2020, 11:19 AM

## 2020-03-01 NOTE — ED Notes (Signed)
Depends and pads changed. Male external urine collection device replaced.

## 2020-03-01 NOTE — ED Notes (Signed)
CBG with EMS 149

## 2020-03-01 NOTE — ED Notes (Signed)
Pt transported to CT ?

## 2020-03-01 NOTE — ED Notes (Signed)
US at bedside

## 2020-03-01 NOTE — ED Triage Notes (Signed)
Pt to ED via [facility] from home. Per wife pt got up around 0700 like usual. Wife stating around 0730 pt started yelling out for help. Wife stating pt was "acting funny" and started having stiff board like shaking. Upon EMS arrival pt disoriented x4. Pt had incontinent episode. Pt with small laceration on right side of tongue. Pt remains disoriented but able to follow commands.   Pt hx HTN, DM and stroke. Pt has prior right sided deficits and aphasia from stroke.

## 2020-03-01 NOTE — Progress Notes (Signed)
OT Cancellation Note  Patient Details Name: Peter Moore MRN: [id number] DOB: 1941/03/12   Cancelled Treatment:    Reason Eval/Treat Not Completed: Active bedrest order. OT order received and chart reviewed. Pt noted to have active bed rest order. Will follow acutely and initiate services at later date/time as appropriate.   Kathie Dike, M.S. OTR/L  03/01/20, 12:12 PM  ascom [phone]

## 2020-03-01 NOTE — H&P (Signed)
History and Physical    Peter Moore VZD:[id number] DOB: [birth year] DOA: 03/01/2020  PCP: [facility]   Patient coming from: Home  I have personally briefly reviewed patient's old medical records in [facility]  Chief Complaint: Altered mental status                               Seizure  Most of the history obtained from patient's wife [name] over the phone  HPI: Peter Moore is a 79 y.o. male with medical history significant for coronary artery disease, diabetes mellitus, hypertension, CVA with expressive aphasia who was brought into the ER by EMS for evaluation of mental status changes and what appears to be a new onset seizure.  Patient woke up this morning and had gone to make coffee when at around 7:30 AM he called out to his wife who states that she witnessed him having a stiff shaking episode that she describes as "bad tremors with stiffening", she states that he was unable to respond to her and so she called EMS.  When EMS arrived they witnessed patient having what they thought to be seizure-like activity and stated that patient had a tongue bite as well as incontinence of urine.  They noted a slight right facial droop that has resolved. Patient was in his usual state of health up until 730 this morning when he had the seizure. I am unable to do a review of systems on this patient. Labs show sodium 139, potassium 3.5, chloride 100, bicarb 26, glucose 159, BUN 18, creatinine 1.4, calcium 9.4, albumin 4.0, AST 21, ALT 12, total protein 7.3, white count 10.4, hemoglobin 13.6, hematocrit 41.3, MCV 87.3, RDW 13.7, platelet count 243 CT scan of head without contrast shows no evidence of an acute intracranial abnormality.  Shows chronic left MCA territory cortical-based infarcts within the left frontal parietal and temporal lobes as well as left insula/subinsular region. MRI of the brain without contrast shows a small subacute infarct of the left coronary radiata.   Chronic infarcts as described. Twelve-lead EKG shows sinus rhythm with left axis deviation   ED Course: Patient is a 79 year old male with a history of a CVA with expressive aphasia, history of coronary artery disease, history of diabetes mellitus who was brought in by EMS for evaluation of what appears to be new onset seizure.  Patient had an MRI of the brain which shows a small subacute infarct of the left corona radiata.  He will be admitted to the hospital for further evaluation.  Review of Systems: As per HPI otherwise 10 point review of systems negative.    Past Medical History:  Diagnosis Date  . Coronary artery disease   . Diabetes mellitus without complication (HCC)   . Hypertension   . Myocardial infarction [facility])     Past Surgical History:  Procedure Laterality Date  . CARDIAC SURGERY    . TONSILLECTOMY       reports that he has been smoking cigarettes. He has never used smokeless tobacco. He reports that he does not drink alcohol. No history on file for drug use.  Allergies  Allergen Reactions  . Clobetasol Other (See Comments)    Chest pain, heartburn, increase blood sugar    No family history on file.   Prior to Admission medications   Medication Sig Start Date End Date Taking? Authorizing Provider  acetaminophen (TYLENOL) 325 MG tablet Take 650 mg by  mouth every 6 (six) hours as needed for pain or fever. 09/05/18  Yes [provider]  aspirin EC 81 MG tablet Take 81 mg by mouth daily.   Yes [provider]  atorvastatin (LIPITOR) 40 MG tablet Take 40 mg by mouth at bedtime. 02/17/20  Yes [provider]  cetirizine (ZYRTEC) 10 MG tablet Take 10 mg by mouth 2 (two) times daily.   Yes [provider]  hydrochlorothiazide (HYDRODIURIL) 25 MG tablet Take 25 mg by mouth daily. 01/28/20  Yes [provider]  losartan (COZAAR) 100 MG tablet Take 100 mg by mouth daily.   Yes [provider]  metFORMIN (GLUCOPHAGE) 500  MG tablet Take 500 mg by mouth 2 (two) times daily with a meal. 09/16/18  Yes [provider]  sertraline (ZOLOFT) 25 MG tablet Take 25 mg by mouth daily. 12/23/19  Yes [provider]    Physical Exam: Vitals:   03/01/20 1000 03/01/20 1100 03/01/20 1130 03/01/20 1300  BP: 135/81 (!) 155/78 122/85 (!) 159/96  Pulse: 74 73 75 72  Resp: 16 15 18 14   Temp:      TempSrc:      SpO2: 96% 99% 100% 99%     Vitals:   03/01/20 1000 03/01/20 1100 03/01/20 1130 03/01/20 1300  BP: 135/81 (!) 155/78 122/85 (!) 159/96  Pulse: 74 73 75 72  Resp: 16 15 18 14   Temp:      TempSrc:      SpO2: 96% 99% 100% 99%    Constitutional: NAD, alert and oriented only to person not to place or time.  Has expressive aphasia Eyes: PERRL, lids and conjunctivae normal ENMT: Mucous membranes are moist.  Neck: normal, supple, no masses, no thyromegaly Respiratory: clear to auscultation bilaterally, no wheezing, no crackles. Normal respiratory effort. No accessory muscle use.  Cardiovascular: Regular rate and rhythm, no murmurs / rubs / gallops. No extremity edema. 2+ pedal pulses. No carotid bruits.  Abdomen: no tenderness, no masses palpated. No hepatosplenomegaly. Bowel sounds positive.  Musculoskeletal: no clubbing / cyanosis. No joint deformity upper and lower extremities.  Skin: no rashes, lesions, ulcers.  Neurologic: No gross focal neurologic deficit.  Able to move all his extremities Psychiatric: Normal mood and affect.   Labs on Admission: I have personally reviewed following labs and imaging studies  CBC: Recent Labs  Lab 03/01/20 0850  WBC 10.4  NEUTROABS 7.4  HGB 13.6  HCT 41.3  MCV 87.3  PLT 243   Basic Metabolic Panel: Recent Labs  Lab 03/01/20 0850  NA 139  K 3.5  CL 100  CO2 26  GLUCOSE 159*  BUN 18  CREATININE 1.41*  CALCIUM 9.4   GFR: CrCl cannot be calculated (Unknown ideal weight.). Liver Function Tests: Recent Labs  Lab 03/01/20 0850  AST 21  ALT  12  ALKPHOS 43  BILITOT 0.8  PROT 7.3  ALBUMIN 4.0   No results for input(s): LIPASE, AMYLASE in the last 168 hours. No results for input(s): AMMONIA in the last 168 hours. Coagulation Profile: Recent Labs  Lab 03/01/20 0850  INR 1.0   Cardiac Enzymes: No results for input(s): CKTOTAL, CKMB, CKMBINDEX, TROPONINI in the last 168 hours. BNP (last 3 results) No results for input(s): PROBNP in the last 8760 hours. HbA1C: No results for input(s): HGBA1C in the last 72 hours. CBG: No results for input(s): GLUCAP in the last 168 hours. Lipid Profile: No results for input(s): CHOL, HDL, LDLCALC, TRIG, CHOLHDL, LDLDIRECT in  the last 72 hours. Thyroid Function Tests: No results for input(s): TSH, T4TOTAL, FREET4, T3FREE, THYROIDAB in the last 72 hours. Anemia Panel: No results for input(s): VITAMINB12, FOLATE, FERRITIN, TIBC, IRON, RETICCTPCT in the last 72 hours. Urine analysis: No results found for: COLORURINE, APPEARANCEUR, LABSPEC, PHURINE, GLUCOSEU, HGBUR, BILIRUBINUR, KETONESUR, PROTEINUR, UROBILINOGEN, NITRITE, LEUKOCYTESUR  Radiological Exams on Admission: CT HEAD WO CONTRAST  Result Date: 03/01/2020 CLINICAL DATA:  Seizure, abnormal neuro exam. EXAM: CT HEAD WITHOUT CONTRAST TECHNIQUE: Contiguous axial images were obtained from the base of the skull through the vertex without intravenous contrast. COMPARISON:  Head CT 01/23/2019. FINDINGS: Brain: Redemonstrated chronic left MCA territory cortically based infarcts within the left frontal, parietal and temporal lobes as well as left insula and subinsular region. Background mild generalized cerebral atrophy. There is no acute intracranial hemorrhage. No demarcated cortical infarct. No extra-axial fluid collection. No evidence of intracranial mass. No midline shift. Vascular: No hyperdense vessel.  Atherosclerotic calcifications. Skull: Normal. Negative for fracture or focal lesion. Sinuses/Orbits: Visualized orbits show no acute  finding. Trace ethmoid sinus mucosal thickening. No significant mastoid effusion. IMPRESSION: No evidence of acute intracranial abnormality. Redemonstrated chronic left MCA territory cortically-based infarcts within the left frontal, parietal and temporal lobes as well as left insula/subinsular region. Background mild generalized cerebral atrophy, stable as compared to the head CT of 01/23/2019. Electronically Signed   By: Jackey LogeKyle  Golden DO   On: 03/01/2020 09:40   MR BRAIN WO CONTRAST  Result Date: 03/01/2020 CLINICAL DATA:  Possible seizure, history of stroke EXAM: MRI HEAD WITHOUT CONTRAST TECHNIQUE: Multiplanar, multiecho pulse sequences of the brain and surrounding structures were obtained without intravenous contrast. COMPARISON:  Correlation made with prior CT imaging FINDINGS: Brain: There is small area diffusion hyperintensity within the left corona radiata. Chronic left MCA territory infarction in with involvement frontal, parietal, and temporal lobes as well as the insula. There is associated hemosiderin deposition. There are also infarcts of the left basal ganglia and thalamus with chronic blood products. Ex vacuo dilatation of the left lateral ventricle secondary to above. Prominence of the ventricles and sulci reflects generalized parenchymal volume loss. Additional patchy T2 hyperintensity in the supratentorial white matter is nonspecific but may reflect minor chronic microvascular ischemic changes. There is no intracranial mass or mass effect. Vascular: Major vessel flow voids at the skull base are preserved. Skull and upper cervical spine: Normal marrow signal is preserved. Sinuses/Orbits: Trace mucosal thickening.  Orbits are unremarkable. Other: Sella is unremarkable.  Mastoid air cells are clear. IMPRESSION: Small subacute infarct of the left corona radiata. Chronic infarcts as described. Electronically Signed   By: Guadlupe SpanishPraneil  Patel M.D.   On: 03/01/2020 12:37    EKG: Independently reviewed.    Sinus rhythm with left axis deviation  Assessment/Plan Principal Problem:   Seizure [facility](HCC) Active Problems:   Coronary artery disease   Diabetes mellitus without complication (HCC)   Hypertension   CVA (cerebral vascular accident) (HCC)   Depression     Seizure Appears to be new onset May be related to history of prior CVA but will obtain further imaging to rule out brain tumor We will place patient on seizure precaution Obtain EEG Lorazepam as needed as needed for seizures We will request neurology consult   CVA Patient noted to have a subacute infarct involving the left corona radiata 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 until seen and evaluated by speech therapy Glycemic control with sliding scale insulin Hold Metformin for now   Hypertension We will allow for permissive hypertension at this time Hold Cozaar and hydrochlorothiazide for now    Coronary artery disease Continue aspirin and statins    Depression Continue sertraline    DVT prophylaxis: SCD Code Status: Full code Family Communication: Greater than 50% of time was spent discussing patient's condition and plan of care with his wife Ms Aashrith Eves over the phone.  All questions and concerns have been addressed.  She verbalizes understanding and agrees with the plan. Disposition Plan: Back to previous home environment Consults called: Neurology    Lucile Shutters MD [facility]     03/01/2020, 2:34 PM

## 2020-03-02 ENCOUNTER — Encounter: Payer: Self-pay | Admitting: Internal Medicine

## 2020-03-02 ENCOUNTER — Other Ambulatory Visit: Payer: Self-pay

## 2020-03-02 LAB — GLUCOSE, CAPILLARY
Glucose-Capillary: 93 mg/dL (ref 70–99)
Glucose-Capillary: 89 mg/dL (ref 70–99)
Glucose-Capillary: 133 mg/dL — ABNORMAL HIGH (ref 70–99)
Glucose-Capillary: 84 mg/dL (ref 70–99)
Glucose-Capillary: 81 mg/dL (ref 70–99)

## 2020-03-02 LAB — LIPID PANEL
Cholesterol: 97 mg/dL (ref 0–200)
HDL: 34 mg/dL — ABNORMAL LOW (ref >40)
LDL Cholesterol: 48 mg/dL (ref 0–99)
Total CHOL/HDL Ratio: 2.9 RATIO
Triglycerides: 73 mg/dL (ref <150)
VLDL: 15 mg/dL (ref 0–40)

## 2020-03-02 LAB — AMMONIA: Ammonia: 33 umol/L (ref 9–35)

## 2020-03-02 MED ORDER — CLOPIDOGREL BISULFATE 75 MG PO TABS
75.0000 mg | ORAL_TABLET | Freq: Every day | ORAL | Status: DC
  Administered 2020-03-02 – 2020-03-03 (×2): 75 mg via ORAL
  Filled 2020-03-02 (×2): qty 1

## 2020-03-02 NOTE — Progress Notes (Signed)
eeg done °

## 2020-03-02 NOTE — Progress Notes (Signed)
SLP Cancellation Note  Patient Details Name: Peter Moore MRN: [id number] DOB: [birth year]   Cancelled treatment:       Reason Eval/Treat Not Completed: Other (comment)  Nursing providing pt care. ST will follow up on next available date.   Aidynn Krenn B. Dreama Saa M.S., CCC-SLP, [facility] Speech-Language Pathologist Rehabilitation Services Office [phone]  Reuel Derby 03/02/2020, 3:57 PM

## 2020-03-02 NOTE — Procedures (Signed)
Patient Name: Peter Moore  MRN: [id number]  Epilepsy Attending: Charlsie Quest  Referring Physician/Provider: Dr Lucile Shutters Date: 03/02/2020 Duration: 21.34 minutes  Patient history: 79 year old male with new onset seizure.  EEG to evaluate for seizures.  Level of alertness: Awake  AEDs during EEG study: Valproic acid  Technical aspects: This EEG study was done with scalp electrodes positioned according to the 10-20 International system of electrode placement. Electrical activity was acquired at a sampling rate of 500Hz  and reviewed with a high frequency filter of 70Hz  and a low frequency filter of 1Hz . EEG data were recorded continuously and digitally stored.   Description: The posterior dominant rhythm consists of 8 Hz activity of moderate voltage (25-35 uV) seen predominantly in posterior head regions, symmetric and reactive to eye opening and eye closing.  EEG showed continuous 3 to 6 Hz theta alpha delta slowing in left hemisphere, maximal left temporal region. Physiologic photic driving was not seen during photic stimulation.  Hyperventilation was not performed.     ABNORMALITY -Continuous slow, left hemisphere, maximal left temporal region  IMPRESSION: This study is suggestive of cortical dysfunction in left hemisphere, maximal left temporal region consistent with underlying encephalomalacia.  No seizures or epileptiform discharges were seen throughout the recording.   Laden Fieldhouse 

## 2020-03-02 NOTE — Evaluation (Signed)
Physical Therapy Evaluation Patient Details Name: Peter Moore MRN: [id number] DOB: 1941/02/01 Today's Date: 03/02/2020   History of Present Illness  79 year old male with a history of a CVA with expressive aphasia, history of coronary artery disease, history of diabetes mellitus who was brought in by EMS for evaluation of what appears to be new onset seizure.  Patient had an MRI of the brain which shows a small subacute infarct of the left corona radiata      Clinical Impression  Pt received in Semi-Fowler's position and agreeable to therapy.  Pt able to perform bed mobility and transfers with supervision.  Upon assessing MMT, pt appears to have generalized weakness in LE, specifically the RLE.  Pt able to come upright and was agreeable to a walk around the nursing station.  Upon standing vitals were taken by nursing staff and pt experience a minor LOB that was corrected by therapist.  Pt then proceeded to ambulate around the nursing station before being transferred back to bed.  Pt does experience some weakness in BLE that will be addressed during skilled therapy treatments while in hospital.  Pt will benefit from PT services in a HHPT setting upon discharge to safely address deficits listed in patient problem list for decreased caregiver assistance and eventual return to PLOF.          Follow Up Recommendations Home health PT    Equipment Recommendations  None recommended by PT    Recommendations for Other Services       Precautions / Restrictions Precautions Precautions: Fall      Mobility  Bed Mobility Overal bed mobility: Needs Assistance Bed Mobility: Rolling;Supine to Sit;Sit to Supine Rolling: Supervision   Supine to sit: Supervision Sit to supine: Supervision   General bed mobility comments: supervision for safety with min cuing for technique    Transfers Overall transfer level: Needs assistance Equipment used: None Transfers: Sit to/from Stand Sit to  Stand: Min guard Stand pivot transfers: Min guard       General transfer comment: Pt able to transfer well, but when assessing vitals in standing, pt did experience small LOB that had to be correctly by therapist.  Ambulation/Gait Ambulation/Gait assistance: Min guard;Min assist Gait Distance (Feet): 160 Feet Assistive device: None Gait Pattern/deviations: Step-through pattern;Decreased step length - right;Decreased stance time - left;Drifts right/left Gait velocity: Decreased   General Gait Details: Pt tends to drift to the right and was able to be corrected by therapist using CGA-MinA.  Stairs            Wheelchair Mobility    Modified Rankin (Stroke Patients Only)       Balance Overall balance assessment: Needs assistance Sitting-balance support: Feet supported Sitting balance-Leahy Scale: Good     Standing balance support: During functional activity Standing balance-Leahy Scale: Fair Standing balance comment: CGA during vitals being taken.             High level balance activites: Head turns High Level Balance Comments: Pt able to perform head turns when walking, but slowed gait while performing.             Pertinent Vitals/Pain Pain Assessment: No/denies pain    Home Living Family/patient expects to be discharged to:: Private residence Living Arrangements: Spouse/significant other Available Help at Discharge: Family;Available PRN/intermittently Type of Home: House Home Access: Stairs to enter   Entrance Stairs-Number of Steps: 4-5 Home Layout: One level Home Equipment: None Additional Comments: Pt reports his wife has equipment she uses but  he does not own any AD    Prior Function Level of Independence: Independent               Hand Dominance   Dominant Hand: Right    Extremity/Trunk Assessment   Upper Extremity Assessment Upper Extremity Assessment: Defer to OT evaluation    Lower Extremity Assessment Lower Extremity  Assessment: Generalized weakness    Cervical / Trunk Assessment Cervical / Trunk Assessment: Normal  Communication   Communication: Expressive difficulties  Cognition Arousal/Alertness: Awake/alert Behavior During Therapy: WFL for tasks assessed/performed Overall Cognitive Status: Difficult to assess                                 General Comments: Secondary to expressive difficulties and word finding. Pt does not appear to be oriented to person, place, or time during session, possibly due to aphasia difficulty.  Pt able to follow commands.      General Comments      Exercises Other Exercises Other Exercises: Pt educated on role of PT and services provided during stay in hospital. Other Exercises: Pt educated on physiological benefits of exercises and ambulation.   Assessment/Plan    PT Assessment Patient needs continued PT services  PT Problem List Decreased strength;Decreased activity tolerance;Decreased balance;Decreased mobility;Decreased safety awareness       PT Treatment Interventions Gait training;Stair training;Functional mobility training;Therapeutic activities;Therapeutic exercise;Balance training;Neuromuscular re-education    PT Goals (Current goals can be found in the Care Plan section)  Acute Rehab PT Goals Patient Stated Goal: to go home PT Goal Formulation: With patient Time For Goal Achievement: 03/16/20 Potential to Achieve Goals: Good    Frequency Min 2X/week   Barriers to discharge        Co-evaluation               AM-PAC PT "6 Clicks" Mobility  Outcome Measure Help needed turning from your back to your side while in a flat bed without using bedrails?: A Little Help needed moving from lying on your back to sitting on the side of a flat bed without using bedrails?: A Little Help needed moving to and from a bed to a chair (including a wheelchair)?: A Little Help needed standing up from a chair using your arms (e.g.,  wheelchair or bedside chair)?: A Little Help needed to walk in hospital room?: A Little Help needed climbing 3-5 steps with a railing? : A Lot 6 Click Score: 17    End of Session Equipment Utilized During Treatment: Gait belt Activity Tolerance: Patient tolerated treatment well Patient left: in bed;with call bell/phone within reach;with bed alarm set Nurse Communication: Mobility status PT Visit Diagnosis: Unsteadiness on feet (R26.81);Other abnormalities of gait and mobility (R26.89);Muscle weakness (generalized) (M62.81);Difficulty in walking, not elsewhere classified (R26.2)    Time: 1532-1600 PT Time Calculation (min) (ACUTE ONLY): 28 min   Charges:   PT Evaluation $PT Eval Low Complexity: 1 Low PT Treatments $Gait Training: 23-37 mins        Nolon Bussing, PT, DPT 03/02/20, 4:33 PM

## 2020-03-02 NOTE — Evaluation (Signed)
Occupational Therapy Evaluation Patient Details Name: Peter Moore MRN: [id number] DOB: [birth year] Today's Date: 03/02/2020    History of Present Illness 79 year old male with a history of a CVA with expressive aphasia, history of coronary artery disease, history of diabetes mellitus who was brought in by EMS for evaluation of what appears to be new onset seizure.  Patient had an MRI of the brain which shows a small subacute infarct of the left corona radiata   Clinical Impression   Patient presenting with decreased I in self care, balance, functional transfer/mobility, endurance, and safety awareness. Patient reports living with wife and being independent with mobility and self care PTA.Pt does report his wife uses assistive devices and it was difficult for this therapist to determine if he assists her at home. He reports he does the driving. Patient currently functioning at min guard level for self care tasks and functional transfers without use of AD. Pt very frustrated with language difficulties and word finding issues this session. Patient will benefit from acute OT to increase overall independence in the areas of ADLs, functional mobility, and safety awarenes in order to safely discharge home.      Follow Up Recommendations  Home health OT;Supervision - Intermittent    Equipment Recommendations  Tub/shower seat;Other (comment) (RW)    Recommendations for Other Services Other (comment) (none at this time)     Precautions / Restrictions Precautions Precautions: Fall      Mobility Bed Mobility Overal bed mobility: Needs Assistance Bed Mobility: Rolling;Supine to Sit;Sit to Supine Rolling: Supervision   Supine to sit: Supervision Sit to supine: Supervision   General bed mobility comments: supervision for safety with min cuing for technique    Transfers Overall transfer level: Needs assistance Equipment used: 1 person hand held assist Transfers: Sit to/from Stand;Stand  Pivot Transfers Sit to Stand: Min guard Stand pivot transfers: Min guard            Balance Overall balance assessment: Needs assistance Sitting-balance support: Feet supported Sitting balance-Leahy Scale: Good     Standing balance support: During functional activity Standing balance-Leahy Scale: Fair Standing balance comment: min guard with IADL tasks          ADL either performed or assessed with clinical judgement   ADL Overall ADL's : Needs assistance/impaired       General ADL Comments: Pt able to don B socks while seated on EOB. He stood and pulled soil bed linens off and pulled on fitted sheet with min guard for balance.     Vision Baseline Vision/History: Wears glasses Wears Glasses: At all times Patient Visual Report: No change from baseline              Pertinent Vitals/Pain Pain Assessment: No/denies pain     Hand Dominance Right   Extremity/Trunk Assessment Upper Extremity Assessment Upper Extremity Assessment: Overall WFL for tasks assessed   Lower Extremity Assessment Lower Extremity Assessment: Defer to PT evaluation   Cervical / Trunk Assessment Cervical / Trunk Assessment: Normal   Communication Communication Communication: Expressive difficulties   Cognition Arousal/Alertness: Awake/alert Behavior During Therapy: WFL for tasks assessed/performed Overall Cognitive Status: Difficult to assess        General Comments: secondary to expressive difficulties and word finding. Pt does appear to be oriented x 4 and follows commands.              Home Living Family/patient expects to be discharged to:: Private residence Living Arrangements: Spouse/significant other Available Help at Discharge: Family;Available  PRN/intermittently Type of Home: House Home Access: Stairs to enter [employer] of Steps: 4-5   Home Layout: One level     Bathroom Shower/Tub: Estate manager/land agent Accessibility: Yes   Home  Equipment: None   Additional Comments: Pt reports his wife has equipment she uses but he does not own any AD      Prior Functioning/Environment Level of Independence: Independent                 OT Problem List: Decreased strength;Decreased safety awareness;Decreased activity tolerance;Decreased knowledge of use of DME or AE;Impaired balance (sitting and/or standing);Decreased knowledge of precautions      OT Treatment/Interventions: Self-care/ADL training;Therapeutic exercise;Therapeutic activities;Energy conservation;Visual/perceptual remediation/compensation;Patient/family education;DME and/or AE instruction;Balance training;Cognitive remediation/compensation    OT Goals(Current goals can be found in the care plan section) Acute Rehab OT Goals Patient Stated Goal: to go home OT Goal Formulation: With patient Time For Goal Achievement: 03/16/20 Potential to Achieve Goals: Good ADL Goals Pt Will Perform Grooming: with modified independence;standing Pt Will Transfer to Toilet: with modified independence;ambulating Pt Will Perform Toileting - Clothing Manipulation and hygiene: with modified independence;sit to/from stand  OT Frequency: Min 1X/week   Barriers to D/C:    none known at this time          AM-PAC OT "6 Clicks" Daily Activity     Outcome Measure Help from another person eating meals?: None Help from another person taking care of personal grooming?: A Little Help from another person toileting, which includes using toliet, bedpan, or urinal?: A Little Help from another person bathing (including washing, rinsing, drying)?: A Little Help from another person to put on and taking off regular upper body clothing?: A Little Help from another person to put on and taking off regular lower body clothing?: A Little 6 Click Score: 19   End of Session Nurse Communication: Mobility status  Activity Tolerance: Patient tolerated treatment well Patient left: in bed;with call  bell/phone within reach  OT Visit Diagnosis: Unsteadiness on feet (R26.81);Muscle weakness (generalized) (M62.81)                Time: 0626-9485 OT Time Calculation (min): 20 min Charges:  OT General Charges $OT Visit: 1 Visit OT Evaluation $OT Eval Low Complexity: 1 Low OT Treatments $Self Care/Home Management : 8-22 mins  Jackquline Denmark, MS, OTR/L , CBIS ascom [phone]  03/02/20, 11:25 AM

## 2020-03-02 NOTE — Progress Notes (Signed)
PROGRESS NOTE    Peter Moore  MWN:[id number] DOB: 1941-03-28 DOA: 03/01/2020 PCP: [facility]    Assessment & Plan:   Principal Problem:   Seizure [facility]) Active Problems:   Coronary artery disease   Diabetes mellitus without complication (HCC)   Hypertension   CVA (cerebral vascular accident) [facility])   Depression   [name] is a 79 y.o. male with medical history significant for coronary artery disease, diabetes mellitus, hypertension, CVA with expressive aphasia who was brought into the ER by EMS for evaluation of mental status changes and what appears to be a new onset seizure.  Patient woke up this morning and had gone to make coffee when at around 7:30 AM he called out to his wife who states that she witnessed him having a stiff shaking episode that she describes as "bad tremors with stiffening", she states that he was unable to respond to her and so she called EMS.  When EMS arrived they witnessed patient having what they thought to be seizure-like activity and stated that patient had a tongue bite as well as incontinence of urine.   Seizure Appears to be new onset May be related to history of prior CVA --Neurology consulted, ordered IV valproic acid load f/u oral PLAN: --continue valproic acid 500 mg TID --EEG today, "No seizures or epileptiform discharges were seen." --No driving until 6 months seizure free.  Subacute Infarct Hx of prior CVA --MRI brain showed a subacute infarct involving the left corona radiata. 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 PLAN: --continue home ASA --add plavix, per neuro --cont statin  Diabetes mellitus --A1c 5.9, well controlled --Hold Metformin for now --d/c fingersticks  Hypertension We will allow for permissive hypertension at this time --Hold home Cozaar and HCTZ for now  Coronary artery disease Continue aspirin and statins  Depression Continue sertraline   DVT  prophylaxis: SCD/Compression stockings Code Status: Full code  Family Communication: wife updated on the phone today Status is: inpatient Dispo:   The patient is from: home Anticipated d/c is to: home w HHPT Anticipated d/c date is: tomorrow Patient currently is not medically stable to d/c due to: being monitored after starting new seizure med, need neurology to clear for discharge.   Subjective and Interval History:  Pt denied pain or dyspnea.  No more seizure since presentation.  No problem with swallowing.   Objective: Vitals:   03/02/20 1349 03/02/20 1540 03/02/20 2004 03/03/20 0402  BP: (!) 154/85 114/80 133/75 118/65  Pulse: 65 81 62 66  Resp: 16 19 18 20   Temp: 97.8 F (36.6 C) 97.7 F (36.5 C) 97.7 F (36.5 C) 97.9 F (36.6 C)  TempSrc: Oral Oral Oral   SpO2: 100% 100% 100% 97%  Weight: 83.4 kg     Height: 5\' 9"  (1.753 m)       Intake/Output Summary (Last 24 hours) at 03/03/2020 0415 Last data filed at 03/02/2020 1700 Gross per 24 hour  Intake --  Output 300 ml  Net -300 ml   Filed Weights   03/01/20 1649 03/02/20 1349  Weight: 87.4 kg 83.4 kg    Examination:   Constitutional: NAD, AAOx3 HEENT: conjunctivae and lids normal, EOMI CV: No cyanosis.   RESP: normal respiratory effort, on RA SKIN: warm, dry and intact Neuro: II - XII grossly intact.   Psych: Normal mood and affect.  Appropriate judgement and reason    Data Reviewed: I have personally reviewed following labs and  imaging studies  CBC: Recent Labs  Lab 03/01/20 0850  WBC 10.4  NEUTROABS 7.4  HGB 13.6  HCT 41.3  MCV 87.3  PLT 243   Basic Metabolic Panel: Recent Labs  Lab 03/01/20 0850  NA 139  K 3.5  CL 100  CO2 26  GLUCOSE 159*  BUN 18  CREATININE 1.41*  CALCIUM 9.4   GFR: Estimated Creatinine Clearance: 44.6 mL/min (A) (by C-G formula based on SCr of 1.41 mg/dL (H)). Liver Function Tests: Recent Labs  Lab 03/01/20 0850  AST 21  ALT 12  ALKPHOS 43  BILITOT 0.8   PROT 7.3  ALBUMIN 4.0   No results for input(s): LIPASE, AMYLASE in the last 168 hours. Recent Labs  Lab 03/02/20 0332  AMMONIA 33   Coagulation Profile: Recent Labs  Lab 03/01/20 0850  INR 1.0   Cardiac Enzymes: No results for input(s): CKTOTAL, CKMB, CKMBINDEX, TROPONINI in the last 168 hours. BNP (last 3 results) No results for input(s): PROBNP in the last 8760 hours. HbA1C: Recent Labs    03/01/20 0850 03/02/20 0331  HGBA1C 6.0* 5.9*   CBG: Recent Labs  Lab 03/02/20 0921 03/02/20 1621 03/02/20 2033 03/02/20 2317 03/03/20 0401  GLUCAP 89 133* 84 81 88   Lipid Profile: Recent Labs    03/02/20 0331  CHOL 97  HDL 34*  LDLCALC 48  TRIG 73  CHOLHDL 2.9   Thyroid Function Tests: No results for input(s): TSH, T4TOTAL, FREET4, T3FREE, THYROIDAB in the last 72 hours. Anemia Panel: No results for input(s): VITAMINB12, FOLATE, FERRITIN, TIBC, IRON, RETICCTPCT in the last 72 hours. Sepsis Labs: No results for input(s): PROCALCITON, LATICACIDVEN in the last 168 hours.  Recent Results (from the past 240 hour(s))  Respiratory Panel by RT PCR (Flu A&B, Covid) - Nasopharyngeal Swab     Status: None   Collection Time: 03/01/20  9:44 AM   Specimen: Nasopharyngeal Swab  Result Value Ref Range Status   SARS Coronavirus 2 by RT PCR NEGATIVE NEGATIVE Final    Comment: (NOTE) SARS-CoV-2 target nucleic acids are NOT DETECTED.  The SARS-CoV-2 RNA is generally detectable in upper respiratoy specimens during the acute phase of infection. The lowest concentration of SARS-CoV-2 viral copies this assay can detect is 131 copies/mL. A negative result does not preclude SARS-Cov-2 infection and should not be used as the sole basis for treatment or other patient management decisions. A negative result may occur with  improper specimen collection/handling, submission of specimen other than nasopharyngeal swab, presence of viral mutation(s) within the areas targeted by this  assay, and inadequate number of viral copies (<131 copies/mL). A negative result must be combined with clinical observations, patient history, and epidemiological information. The expected result is Negative.  Fact Sheet for Patients:  https://www.moore.com/  Fact Sheet for Healthcare Providers:  https://www.young.biz/  This test is no t yet approved or cleared by the [place] FDA and  has been authorized for detection and/or diagnosis of SARS-CoV-2 by FDA under an Emergency Use Authorization (EUA). This EUA will remain  in effect (meaning this test can be used) for the duration of the COVID-19 declaration under Section 564(b)(1) of the Act, 21 U.S.C. section 360bbb-3(b)(1), unless the authorization is terminated or revoked sooner.     Influenza A by PCR NEGATIVE NEGATIVE Final   Influenza B by PCR NEGATIVE NEGATIVE Final    Comment: (NOTE) The Xpert Xpress SARS-CoV-2/FLU/RSV assay is intended as an aid in  the diagnosis of influenza from Nasopharyngeal swab specimens  and  should not be used as a sole basis for treatment. Nasal washings and  aspirates are unacceptable for Xpert Xpress SARS-CoV-2/FLU/RSV  testing.  Fact Sheet for Patients: https://www.moore.com/https://www.fda.gov/media/142436/download  Fact Sheet for Healthcare Providers: https://www.young.biz/https://www.fda.gov/media/142435/download  This test is not yet approved or cleared by the [place] FDA and  has been authorized for detection and/or diagnosis of SARS-CoV-2 by  FDA under an Emergency Use Authorization (EUA). This EUA will remain  in effect (meaning this test can be used) for the duration of the  Covid-19 declaration under Section 564(b)(1) of the Act, 21  U.S.C. section 360bbb-3(b)(1), unless the authorization is  terminated or revoked. Performed at [facility], [address] [place], [place] [address]       Radiology Studies: EEG  Result Date: 03/02/2020 Charlsie QuestYadav, Priyanka O,  MD     03/02/2020  3:47 PM Patient Name: Peter BarterKenneth Uplinger MRN: [id number] Epilepsy Attending: Charlsie QuestPriyanka O Yadav Referring Physician/Provider: Dr Lucile Shuttersochukwu Agbata Date: 03/02/2020 Duration: 21.34 minutes Patient history: 79 year old male with new onset seizure.  EEG to evaluate for seizures. Level of alertness: Awake AEDs during EEG study: Valproic acid Technical aspects: This EEG study was done with scalp electrodes positioned according to the 10-20 International system of electrode placement. Electrical activity was acquired at a sampling rate of 500Hz  and reviewed with a high frequency filter of 70Hz  and a low frequency filter of 1Hz . EEG data were recorded continuously and digitally stored. Description: The posterior dominant rhythm consists of 8 Hz activity of moderate voltage (25-35 uV) seen predominantly in posterior head regions, symmetric and reactive to eye opening and eye closing.  EEG showed continuous 3 to 6 Hz theta alpha delta slowing in left hemisphere, maximal left temporal region. Physiologic photic driving was not seen during photic stimulation.  Hyperventilation was not performed.   ABNORMALITY -Continuous slow, left hemisphere, maximal left temporal region IMPRESSION: This study is suggestive of cortical dysfunction in left hemisphere, maximal left temporal region consistent with underlying encephalomalacia.  No seizures or epileptiform discharges were seen throughout the recording. Charlsie QuestPriyanka O Yadav   CT HEAD WO CONTRAST  Result Date: 03/01/2020 CLINICAL DATA:  Seizure, abnormal neuro exam. EXAM: CT HEAD WITHOUT CONTRAST TECHNIQUE: Contiguous axial images were obtained from the base of the skull through the vertex without intravenous contrast. COMPARISON:  Head CT 01/23/2019. FINDINGS: Brain: Redemonstrated chronic left MCA territory cortically based infarcts within the left frontal, parietal and temporal lobes as well as left insula and subinsular region. Background mild generalized cerebral  atrophy. There is no acute intracranial hemorrhage. No demarcated cortical infarct. No extra-axial fluid collection. No evidence of intracranial mass. No midline shift. Vascular: No hyperdense vessel.  Atherosclerotic calcifications. Skull: Normal. Negative for fracture or focal lesion. Sinuses/Orbits: Visualized orbits show no acute finding. Trace ethmoid sinus mucosal thickening. No significant mastoid effusion. IMPRESSION: No evidence of acute intracranial abnormality. Redemonstrated chronic left MCA territory cortically-based infarcts within the left frontal, parietal and temporal lobes as well as left insula/subinsular region. Background mild generalized cerebral atrophy, stable as compared to the head CT of 01/23/2019. Electronically Signed   By: Jackey LogeKyle  Golden DO   On: 03/01/2020 09:40   MR BRAIN WO CONTRAST  Result Date: 03/01/2020 CLINICAL DATA:  Possible seizure, history of stroke EXAM: MRI HEAD WITHOUT CONTRAST TECHNIQUE: Multiplanar, multiecho pulse sequences of the brain and surrounding structures were obtained without intravenous contrast. COMPARISON:  Correlation made with prior CT imaging FINDINGS: Brain: There is small area diffusion hyperintensity within the left  corona radiata. Chronic left MCA territory infarction in with involvement frontal, parietal, and temporal lobes as well as the insula. There is associated hemosiderin deposition. There are also infarcts of the left basal ganglia and thalamus with chronic blood products. Ex vacuo dilatation of the left lateral ventricle secondary to above. Prominence of the ventricles and sulci reflects generalized parenchymal volume loss. Additional patchy T2 hyperintensity in the supratentorial white matter is nonspecific but may reflect minor chronic microvascular ischemic changes. There is no intracranial mass or mass effect. Vascular: Major vessel flow voids at the skull base are preserved. Skull and upper cervical spine: Normal marrow signal is  preserved. Sinuses/Orbits: Trace mucosal thickening.  Orbits are unremarkable. Other: Sella is unremarkable.  Mastoid air cells are clear. IMPRESSION: Small subacute infarct of the left corona radiata. Chronic infarcts as described. Electronically Signed   By: Guadlupe Spanish M.D.   On: 03/01/2020 12:37   US Carotid Bilateral (at [facility] and [facility] only)  Result Date: 03/01/2020 CLINICAL DATA:  Carotid atherosclerosis, hypertension, diabetes, tobacco use EXAM: BILATERAL CAROTID DUPLEX ULTRASOUND TECHNIQUE: Wallace Cullens scale imaging, color Doppler and duplex ultrasound were performed of bilateral carotid and vertebral arteries in the neck. COMPARISON:  None. FINDINGS: Criteria: Quantification of carotid stenosis is based on velocity parameters that correlate the residual internal carotid diameter with NASCET-based stenosis levels, using the diameter of the distal internal carotid lumen as the denominator for stenosis measurement. The following velocity measurements were obtained: RIGHT ICA: 89/30 cm/sec CCA: 92/18 cm/sec SYSTOLIC ICA/CCA RATIO:  1.0 ECA: 92 cm/sec LEFT ICA: 83/24 cm/sec CCA: 70/18 cm/sec SYSTOLIC ICA/CCA RATIO:  1.2 ECA: 78 cm/sec RIGHT CAROTID ARTERY: Moderate heterogeneous calcified bifurcation atherosclerosis. Despite this, no hemodynamically significant right ICA stenosis, velocity elevation, turbulent flow. Degree of narrowing less than 50% by ultrasound criteria. Tortuosity noted. RIGHT VERTEBRAL ARTERY:  Normal antegrade flow LEFT CAROTID ARTERY: Less severe intimal thickening and hypoechoic atherosclerosis. No hemodynamically significant left ICA stenosis, velocity elevation, or turbulent flow. Degree of narrowing less than 50% by ultrasound criteria. LEFT VERTEBRAL ARTERY:  Normal antegrade flow IMPRESSION: Bilateral carotid atherosclerosis, worse on the right. No hemodynamically significant ICA stenosis. Degree of narrowing less than 50% bilaterally by ultrasound criteria. Patent antegrade vertebral  flow bilaterally Electronically Signed   By: Judie Petit.  Shick M.D.   On: 03/01/2020 16:06   ECHOCARDIOGRAM COMPLETE  Result Date: 03/01/2020    ECHOCARDIOGRAM REPORT   Patient Name:   AVYN COATE Date of Exam: 03/01/2020 Medical Rec #:  [id number]     Height:       65.0 in Accession #:    [id number]    Weight:       182.0 lb Date of Birth:  Oct 07, 1940    BSA:          1.900 m Patient Age:    78 years      BP:           135/81 mmHg Patient Gender: M             HR:           74 bpm. Exam Location:  [facility] Procedure: 2D Echo, Cardiac Doppler and Color Doppler Indications:     Stroke 434.91  History:         Patient has no prior history of Echocardiogram examinations.                  Previous Myocardial Infarction and CAD; Risk  Factors:Hypertension and Diabetes.  Sonographer:     Cristela Blue RDCS (AE) Referring Phys:  [id number] Lucile Shutters Diagnosing Phys: Yvonne Kendall MD IMPRESSIONS  1. Left ventricular ejection fraction, by estimation, is 60 to 65%. The left ventricle has normal function. The left ventricle demonstrates regional wall motion abnormalities (see scoring diagram/findings for description). There is moderate left ventricular hypertrophy. Left ventricular diastolic parameters are consistent with Grade I diastolic dysfunction (impaired relaxation). Elevated left atrial pressure. There is mild hypokinesis of the left ventricular, basal inferior wall and inferolateral wall.  2. Right ventricular systolic function is normal. The right ventricular size is normal. Mildly increased right ventricular wall thickness. There is normal pulmonary artery systolic pressure.  3. The mitral valve is grossly normal. No evidence of mitral valve regurgitation. No evidence of mitral stenosis.  4. The aortic valve has an indeterminant number of cusps. There is moderate calcification of the aortic valve. There is moderate thickening of the aortic valve. Aortic valve regurgitation is trivial. Mild to moderate  aortic valve stenosis.  5. The inferior vena cava is normal in size with greater than 50% respiratory variability, suggesting right atrial pressure of 3 mmHg. FINDINGS  Left Ventricle: Left ventricular ejection fraction, by estimation, is 60 to 65%. The left ventricle has normal function. The left ventricle demonstrates regional wall motion abnormalities. Mild hypokinesis of the left ventricular, basal inferior wall and inferolateral wall. The left ventricular internal cavity size was normal in size. There is moderate left ventricular hypertrophy. Left ventricular diastolic parameters are consistent with Grade I diastolic dysfunction (impaired relaxation). Elevated left atrial pressure. Right Ventricle: The right ventricular size is normal. Mildly increased right ventricular wall thickness. Right ventricular systolic function is normal. There is normal pulmonary artery systolic pressure. The tricuspid regurgitant velocity is 2.59 m/s, and with an assumed right atrial pressure of 3 mmHg, the estimated right ventricular systolic pressure is 29.8 mmHg. Left Atrium: Left atrial size was normal in size. Right Atrium: Right atrial size was normal in size. Pericardium: There is no evidence of pericardial effusion. Mitral Valve: The mitral valve is grossly normal. No evidence of mitral valve regurgitation. No evidence of mitral valve stenosis. Tricuspid Valve: The tricuspid valve is grossly normal. Tricuspid valve regurgitation is mild. Aortic Valve: The aortic valve has an indeterminant number of cusps. There is moderate calcification of the aortic valve. There is moderate thickening of the aortic valve. Aortic valve regurgitation is trivial. Mild to moderate aortic stenosis is present. Aortic valve mean gradient measures 16.0 mmHg. Aortic valve peak gradient measures 26.2 mmHg. Aortic valve area, by VTI measures 1.42 cm. Pulmonic Valve: The pulmonic valve was not well visualized. Pulmonic valve regurgitation is not  visualized. No evidence of pulmonic stenosis. Aorta: The aortic root is normal in size and structure. Pulmonary Artery: The pulmonary artery is of normal size. Venous: The inferior vena cava is normal in size with greater than 50% respiratory variability, suggesting right atrial pressure of 3 mmHg. IAS/Shunts: The interatrial septum was not well visualized.  LEFT VENTRICLE PLAX 2D LVIDd:         3.57 cm  Diastology LVIDs:         2.13 cm  LV e' medial:    4.03 cm/s LV PW:         1.26 cm  LV E/e' medial:  21.5 LV IVS:        0.97 cm  LV e' lateral:   6.74 cm/s LVOT diam:     2.00 cm  LV E/e' lateral: 12.8 LV SV:         77 LV SV Index:   41 LVOT Area:     3.14 cm  RIGHT VENTRICLE RV Basal diam:  3.83 cm RV S prime:     14.50 cm/s TAPSE (M-mode): 2.4 cm LEFT ATRIUM             Index       RIGHT ATRIUM           Index LA diam:        2.90 cm 1.53 cm/m  RA Area:     14.10 cm LA Vol (A2C):   21.8 ml 11.47 ml/m RA Volume:   36.30 ml  19.10 ml/m LA Vol (A4C):   21.9 ml 11.52 ml/m LA Biplane Vol: 23.9 ml 12.58 ml/m  AORTIC VALVE                    PULMONIC VALVE AV Area (Vmax):    1.19 cm     PV Vmax:        0.97 m/s AV Area (Vmean):   1.19 cm     PV Peak grad:   3.7 mmHg AV Area (VTI):     1.42 cm     RVOT Peak grad: 5 mmHg AV Vmax:           256.00 cm/s AV Vmean:          188.667 cm/s AV VTI:            0.541 m AV Peak Grad:      26.2 mmHg AV Mean Grad:      16.0 mmHg LVOT Vmax:         97.30 cm/s LVOT Vmean:        71.300 cm/s LVOT VTI:          0.245 m LVOT/AV VTI ratio: 0.45  AORTA Ao Root diam: 3.10 cm MITRAL VALVE                TRICUSPID VALVE MV Area (PHT): 2.67 cm     TR Peak grad:   26.8 mmHg MV Decel Time: 284 msec     TR Vmax:        259.00 cm/s MV E velocity: 86.50 cm/s MV A velocity: 122.00 cm/s  SHUNTS MV E/A ratio:  0.71         Systemic VTI:  0.24 m                             Systemic Diam: 2.00 cm Yvonne Kendall MD Electronically signed by Yvonne Kendall MD Signature Date/Time:  03/01/2020/6:45:33 PM    Final      Scheduled Meds: .  stroke: mapping our early stages of recovery book   Does not apply Once  . aspirin  300 mg Rectal Daily   Or  . aspirin  325 mg Oral Daily  . atorvastatin  40 mg Oral QHS  . clopidogrel  75 mg Oral Daily  . insulin aspart  0-15 Units Subcutaneous Q4H  . loratadine  10 mg Oral Daily  . sertraline  25 mg Oral Daily  . valproic acid  500 mg Oral TID   Continuous Infusions: . sodium chloride Stopped (03/01/20 1900)     LOS: 2 days     Darlin Priestly, MD [facility] If 7PM-7AM, please contact night-coverage 03/03/2020, 4:15 AM

## 2020-03-03 LAB — CBC
HCT: 38.9 % — ABNORMAL LOW (ref 39.0–52.0)
Hemoglobin: 13.1 g/dL (ref 13.0–17.0)
MCH: 29.5 pg (ref 26.0–34.0)
MCHC: 33.7 g/dL (ref 30.0–36.0)
MCV: 87.6 fL (ref 80.0–100.0)
Platelets: 215 10*3/uL (ref 150–400)
RBC: 4.44 MIL/uL (ref 4.22–5.81)
RDW: 13.8 % (ref 11.5–15.5)
WBC: 9.3 10*3/uL (ref 4.0–10.5)
nRBC: 0 % (ref 0.0–0.2)

## 2020-03-03 LAB — BASIC METABOLIC PANEL
Anion gap: 10 (ref 5–15)
BUN: 19 mg/dL (ref 8–23)
CO2: 27 mmol/L (ref 22–32)
Calcium: 9.2 mg/dL (ref 8.9–10.3)
Chloride: 103 mmol/L (ref 98–111)
Creatinine, Ser: 1.08 mg/dL (ref 0.61–1.24)
GFR, Estimated: >60 mL/min (ref >60)
Glucose, Bld: 86 mg/dL (ref 70–99)
Potassium: 3.5 mmol/L (ref 3.5–5.1)
Sodium: 140 mmol/L (ref 135–145)

## 2020-03-03 LAB — HEMOGLOBIN A1C
Hgb A1c MFr Bld: 6 % — ABNORMAL HIGH (ref 4.8–5.6)
Hgb A1c MFr Bld: 5.9 % — ABNORMAL HIGH (ref 4.8–5.6)
Mean Plasma Glucose: 126 mg/dL
Mean Plasma Glucose: 123 mg/dL

## 2020-03-03 LAB — GLUCOSE, CAPILLARY: Glucose-Capillary: 88 mg/dL (ref 70–99)

## 2020-03-03 LAB — MAGNESIUM: Magnesium: 2.2 mg/dL (ref 1.7–2.4)

## 2020-03-03 MED ORDER — VALPROIC ACID 250 MG PO CAPS
500.0000 mg | ORAL_CAPSULE | Freq: Three times a day (TID) | ORAL | 1 refills | Status: DC
Start: 2020-03-03 — End: 2020-08-17

## 2020-03-03 MED ORDER — CLOPIDOGREL BISULFATE 75 MG PO TABS: 75.0000 mg | ORAL_TABLET | Freq: Every day | ORAL | 1 refills | Status: DC

## 2020-03-03 NOTE — Plan of Care (Signed)
Pt dc per MD order, dc instructions and follow up appt gone over with pt and spouse, iv dc, site look great and intact. Pt transported via WC  to the lobby.   Problem: Education: Goal: Knowledge of General Education information will improve Description: Including pain rating scale, medication(s)/side effects and non-pharmacologic comfort measures 03/03/2020 1325 by Wess Botts, RN Outcome: Adequate for Discharge 03/03/2020 1121 by Wess Botts, RN Outcome: Adequate for Discharge   Problem: Health Behavior/Discharge Planning: Goal: Ability to manage health-related needs will improve 03/03/2020 1325 by Wess Botts, RN Outcome: Adequate for Discharge 03/03/2020 1121 by Wess Botts, RN Outcome: Adequate for Discharge   Problem: Clinical Measurements: Goal: Ability to maintain clinical measurements within normal limits will improve 03/03/2020 1325 by Wess Botts, RN Outcome: Adequate for Discharge 03/03/2020 1121 by Wess Botts, RN Outcome: Adequate for Discharge Goal: Will remain free from infection 03/03/2020 1325 by Wess Botts, RN Outcome: Adequate for Discharge 03/03/2020 1121 by Wess Botts, RN Outcome: Adequate for Discharge Goal: Diagnostic test results will improve 03/03/2020 1325 by Wess Botts, RN Outcome: Adequate for Discharge 03/03/2020 1121 by Wess Botts, RN Outcome: Adequate for Discharge Goal: Respiratory complications will improve 03/03/2020 1325 by Wess Botts, RN Outcome: Adequate for Discharge 03/03/2020 1121 by Wess Botts, RN Outcome: Adequate for Discharge Goal: Cardiovascular complication will be avoided 03/03/2020 1325 by Wess Botts, RN Outcome: Adequate for Discharge 03/03/2020 1121 by Wess Botts, RN Outcome: Adequate for Discharge   Problem: Activity: Goal: Risk for activity intolerance will decrease 03/03/2020 1325 by Wess Botts, RN Outcome: Adequate for  Discharge 03/03/2020 1121 by Wess Botts, RN Outcome: Adequate for Discharge   Problem: Nutrition: Goal: Adequate nutrition will be maintained 03/03/2020 1325 by Wess Botts, RN Outcome: Adequate for Discharge 03/03/2020 1121 by Wess Botts, RN Outcome: Adequate for Discharge   Problem: Coping: Goal: Level of anxiety will decrease 03/03/2020 1325 by Wess Botts, RN Outcome: Adequate for Discharge 03/03/2020 1121 by Wess Botts, RN Outcome: Adequate for Discharge   Problem: Elimination: Goal: Will not experience complications related to bowel motility 03/03/2020 1325 by Wess Botts, RN Outcome: Adequate for Discharge 03/03/2020 1121 by Wess Botts, RN Outcome: Adequate for Discharge Goal: Will not experience complications related to urinary retention 03/03/2020 1325 by Wess Botts, RN Outcome: Adequate for Discharge 03/03/2020 1121 by Wess Botts, RN Outcome: Adequate for Discharge   Problem: Pain Managment: Goal: General experience of comfort will improve 03/03/2020 1325 by Wess Botts, RN Outcome: Adequate for Discharge 03/03/2020 1121 by Wess Botts, RN Outcome: Adequate for Discharge   Problem: Safety: Goal: Ability to remain free from injury will improve 03/03/2020 1325 by Wess Botts, RN Outcome: Adequate for Discharge 03/03/2020 1121 by Wess Botts, RN Outcome: Adequate for Discharge   Problem: Skin Integrity: Goal: Risk for impaired skin integrity will decrease 03/03/2020 1325 by Wess Botts, RN Outcome: Adequate for Discharge 03/03/2020 1121 by Wess Botts, RN Outcome: Adequate for Discharge

## 2020-03-03 NOTE — TOC Initial Note (Signed)
Transition of Care [facility]) - Initial/Assessment Note    Patient Details  Name: Peter Moore MRN: [id number] Date of Birth: 1940/10/10  Transition of Care [facility]) CM/SW Contact:    Maree Krabbe, LCSW Phone Number: 03/03/2020, 12:12 PM  Clinical Narrative:    Pt is agreeable to [facility] however ask that CSW contact his wife regarding acency choice. CSW spoke with pt's spouse and she states pt has used [facility] in the past and she was agreeable to use them again. Referral made with Maralyn Sago at [facility].Pt's spouse will be picking pt up at d/c.             Expected Discharge Plan: Home w Home Health Services Barriers to Discharge: No Barriers Identified   Patient Goals and CMS Choice Patient states their goals for this hospitalization and ongoing recovery are:: to get better   Choice offered to / list presented to : Spouse, Patient  Expected Discharge Plan and Services Expected Discharge Plan: Home w Home Health Services     Post Acute Care Choice: Home Health Living arrangements for the past 2 months: Single Family Home Expected Discharge Date: 03/03/20                         HH Arranged: PT HH Agency: [facility] Date [facility] Agency Contacted: 03/03/20 Time HH Agency Contacted: [id number] Representative spoke with at [facility] Agency: sara  Prior Living Arrangements/Services Living arrangements for the past 2 months: Single Family Home Lives with:: Spouse Patient language and need for interpreter reviewed:: Yes Do you feel safe going back to the place where you live?: Yes      Need for Family Participation in Patient Care: Yes (Comment) Care giver support system in place?: Yes (comment)   Criminal Activity/Legal Involvement Pertinent to Current Situation/Hospitalization: No - Comment as needed  Activities of Daily Living Home Assistive Devices/Equipment: None ADL Screening (condition at time of admission) Patient's cognitive ability adequate to safely complete daily activities?:  Yes Is the patient deaf or have difficulty hearing?: No Does the patient have difficulty seeing, even when wearing glasses/contacts?: No Does the patient have difficulty concentrating, remembering, or making decisions?: No Patient able to express need for assistance with ADLs?: Yes Does the patient have difficulty dressing or bathing?: No Independently performs ADLs?: Yes (appropriate for developmental age) Does the patient have difficulty walking or climbing stairs?: No Weakness of Legs: None Weakness of Arms/Hands: None  Permission Sought/Granted Permission sought to share information with : Family Supports Permission granted to share information with : Yes, Verbal Permission Granted  Share Information with NAME: linda  Permission granted to share info w AGENCY: [facility]  Permission granted to share info w Relationship: spouse     Emotional Assessment Appearance:: Appears stated age Attitude/Demeanor/Rapport: Engaged Affect (typically observed): Accepting, Appropriate Orientation: : Oriented to Self, Oriented to Place, Oriented to  Time, Oriented to Situation Alcohol / Substance Use: Not Applicable Psych Involvement: No (comment)  Admission diagnosis:  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 Seizure (HCC) 03/01/2020  . Coronary artery disease   . Diabetes mellitus without complication (HCC)   . Hypertension   . CVA (cerebral vascular accident) (HCC)   . Depression    PCP:  Jerrilyn [facility]:   [id number] - [place], [place] - [address] AT [facility] [address]  [place] [address] Phone: [phone] Fax: [phone]     Social Determinants of Health (SDOH) Interventions    Readmission Risk Interventions No flowsheet data found.

## 2020-03-03 NOTE — Discharge Summary (Signed)
Physician Discharge Summary  Peter Moore ONG:[id number] DOB: 1941/04/30 DOA: 03/01/2020  PCP: [facility]  Admit date: 03/01/2020 Discharge date: 03/03/2020  Admitted From: Home Disposition: Home  Recommendations for Outpatient Follow-up:  1. Follow up with PCP in 1-2 weeks 2. Follow-up with neurology 3. Please obtain BMP/CBC in one week 4. Please follow up on the following pending results: None  Home Health: Yes Equipment/Devices: None Discharge Condition: Stable CODE STATUS: Full Diet recommendation: Heart Healthy / Carb Modified  Brief/Interim Summary: Peter Moore a 79 y.Moore.malewith medical history significant forcoronary artery disease, diabetes mellitus, hypertension, CVA with expressive aphasia who was brought into the ER by EMS for evaluation of mental status changes and what appears to be a new onset seizure. Patient woke up this morning and had gone to make coffee when at around 7:30 AM he called out to his wife who states that she witnessed him having a stiff shaking episode that she describes as "bad tremorswith stiffening",she states that he was unable to respond to her and so she called EMS. When EMS arrived they witnessed patient having what they thought to be seizure-like activity and stated that patient had a tongue bite as well as incontinence of urine. Per patient he did had similar episodes when he had prior CVA.  Found to have a new infarct involving the left corona radiata.  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 EEG was negative for any epileptiform changes.  He was started on valproic acid and advised to follow-up with neurology and avoid driving till they clears him.  Patient will also continue with aspirin and Plavix for 3 weeks and then only take Plavix along with statin.  Patient has well-controlled diabetes with A1c of 5.9.  Patient will continue with rest of his home meds and follow-up with his primary  care provider and neurologist.  Discharge Diagnoses:  Principal Problem:   Seizure [facility]) Active Problems:   Coronary artery disease   Diabetes mellitus without complication (HCC)   Hypertension   CVA (cerebral vascular accident) [facility])   Depression   Discharge Instructions  Discharge Instructions    Diet - low sodium heart healthy   Complete by: As directed    Discharge instructions   Complete by: As directed    It was pleasure taking care of you. Continue taking aspirin and Plavix for 3 weeks and then continue with Plavix only and stop taking aspirin. Please follow-up with neurology in 1 to 2 weeks for further recommendations regarding your seizure medication. Do not drive for 20-month or until neurology clears you.   Increase activity slowly   Complete by: As directed      Allergies as of 03/03/2020      Reactions   Clobetasol Other (See Comments)   Chest pain, heartburn, increase blood sugar      Medication List    TAKE these medications   acetaminophen 325 MG tablet Commonly known as: TYLENOL Take 650 mg by mouth every 6 (six) hours as needed for pain or fever.   aspirin EC 81 MG tablet Take 81 mg by mouth daily.   atorvastatin 40 MG tablet Commonly known as: LIPITOR Take 40 mg by mouth at bedtime.   cetirizine 10 MG tablet Commonly known as: ZYRTEC Take 10 mg by mouth 2 (two) times daily.   clopidogrel 75 MG tablet Commonly known as: PLAVIX Take 1 tablet (75 mg total) by mouth daily. Start taking on: March 04, 2020   hydrochlorothiazide  25 MG tablet Commonly known as: HYDRODIURIL Take 25 mg by mouth daily.   losartan 100 MG tablet Commonly known as: COZAAR Take 100 mg by mouth daily.   metFORMIN 500 MG tablet Commonly known as: GLUCOPHAGE Take 500 mg by mouth 2 (two) times daily with a meal.   sertraline 25 MG tablet Commonly known as: ZOLOFT Take 25 mg by mouth daily.   valproic acid 250 MG capsule Commonly known as: DEPAKENE Take 2  capsules (500 mg total) by mouth 3 (three) times daily.       Follow-up Information    [place]. Schedule an appointment as soon as possible for a visit.   Contact information: [address] [place] [address] [place] [address] [phone]        Lonell FaceShah, Hemang K, MD. Schedule an appointment as soon as possible for a visit.   Specialty: Neurology Contact information: [phone] [facility] [address] [address] [facility] [place] [address] [phone]              Allergies  Allergen Reactions  . Clobetasol Other (See Comments)    Chest pain, heartburn, increase blood sugar    Consultations:  Neurology  Procedures/Studies: EEG  Result Date: 03/02/2020 Peter Moore, Peter O, MD     03/02/2020  3:47 PM Patient Name: Peter Moore MRN: [id number] Epilepsy Attending: Charlsie QuestPriyanka Moore Moore Referring Physician/Provider: Dr Lucile Shuttersochukwu Agbata Date: 03/02/2020 Duration: 21.34 minutes Patient history: 79 year old male with new onset seizure.  EEG to evaluate for seizures. Level of alertness: Awake AEDs during EEG study: Valproic acid Technical aspects: This EEG study was done with scalp electrodes positioned according to the 10-20 International system of electrode placement. Electrical activity was acquired at a sampling rate of 500Hz  and reviewed with a high frequency filter of 70Hz  and a low frequency filter of 1Hz . EEG data were recorded continuously and digitally stored. Description: The posterior dominant rhythm consists of 8 Hz activity of moderate voltage (25-35 uV) seen predominantly in posterior head regions, symmetric and reactive to eye opening and eye closing.  EEG showed continuous 3 to 6 Hz theta alpha delta slowing in left hemisphere, maximal left temporal region. Physiologic photic driving was not seen during photic stimulation.  Hyperventilation was not performed.   ABNORMALITY -Continuous slow, left hemisphere, maximal left temporal region IMPRESSION: This study is suggestive  of cortical dysfunction in left hemisphere, maximal left temporal region consistent with underlying encephalomalacia.  No seizures or epileptiform discharges were seen throughout the recording. Peter QuestPriyanka Moore Moore   CT HEAD WO CONTRAST  Result Date: 03/01/2020 CLINICAL DATA:  Seizure, abnormal neuro exam. EXAM: CT HEAD WITHOUT CONTRAST TECHNIQUE: Contiguous axial images were obtained from the base of the skull through the vertex without intravenous contrast. COMPARISON:  Head CT 01/23/2019. FINDINGS: Brain: Redemonstrated chronic left MCA territory cortically based infarcts within the left frontal, parietal and temporal lobes as well as left insula and subinsular region. Background mild generalized cerebral atrophy. There is no acute intracranial hemorrhage. No demarcated cortical infarct. No extra-axial fluid collection. No evidence of intracranial mass. No midline shift. Vascular: No hyperdense vessel.  Atherosclerotic calcifications. Skull: Normal. Negative for fracture or focal lesion. Sinuses/Orbits: Visualized orbits show no acute finding. Trace ethmoid sinus mucosal thickening. No significant mastoid effusion. IMPRESSION: No evidence of acute intracranial abnormality. Redemonstrated chronic left MCA territory cortically-based infarcts within the left frontal, parietal and temporal lobes as well as left insula/subinsular region. Background mild generalized cerebral atrophy, stable as compared to the head CT of  01/23/2019. Electronically Signed   By: Jackey Loge DO   On: 03/01/2020 09:40   MR BRAIN WO CONTRAST  Result Date: 03/01/2020 CLINICAL DATA:  Possible seizure, history of stroke EXAM: MRI HEAD WITHOUT CONTRAST TECHNIQUE: Multiplanar, multiecho pulse sequences of the brain and surrounding structures were obtained without intravenous contrast. COMPARISON:  Correlation made with prior CT imaging FINDINGS: Brain: There is small area diffusion hyperintensity within the left corona radiata. Chronic  left MCA territory infarction in with involvement frontal, parietal, and temporal lobes as well as the insula. There is associated hemosiderin deposition. There are also infarcts of the left basal ganglia and thalamus with chronic blood products. Ex vacuo dilatation of the left lateral ventricle secondary to above. Prominence of the ventricles and sulci reflects generalized parenchymal volume loss. Additional patchy T2 hyperintensity in the supratentorial white matter is nonspecific but may reflect minor chronic microvascular ischemic changes. There is no intracranial mass or mass effect. Vascular: Major vessel flow voids at the skull base are preserved. Skull and upper cervical spine: Normal marrow signal is preserved. Sinuses/Orbits: Trace mucosal thickening.  Orbits are unremarkable. Other: Sella is unremarkable.  Mastoid air cells are clear. IMPRESSION: Small subacute infarct of the left corona radiata. Chronic infarcts as described. Electronically Signed   By: Guadlupe Spanish M.D.   On: 03/01/2020 12:37   US Carotid Bilateral (at [facility] and [facility] only)  Result Date: 03/01/2020 CLINICAL DATA:  Carotid atherosclerosis, hypertension, diabetes, tobacco use EXAM: BILATERAL CAROTID DUPLEX ULTRASOUND TECHNIQUE: Wallace Cullens scale imaging, color Doppler and duplex ultrasound were performed of bilateral carotid and vertebral arteries in the neck. COMPARISON:  None. FINDINGS: Criteria: Quantification of carotid stenosis is based on velocity parameters that correlate the residual internal carotid diameter with NASCET-based stenosis levels, using the diameter of the distal internal carotid lumen as the denominator for stenosis measurement. The following velocity measurements were obtained: RIGHT ICA: 89/30 cm/sec CCA: 92/18 cm/sec SYSTOLIC ICA/CCA RATIO:  1.0 ECA: 92 cm/sec LEFT ICA: 83/24 cm/sec CCA: 70/18 cm/sec SYSTOLIC ICA/CCA RATIO:  1.2 ECA: 78 cm/sec RIGHT CAROTID ARTERY: Moderate heterogeneous calcified bifurcation  atherosclerosis. Despite this, no hemodynamically significant right ICA stenosis, velocity elevation, turbulent flow. Degree of narrowing less than 50% by ultrasound criteria. Tortuosity noted. RIGHT VERTEBRAL ARTERY:  Normal antegrade flow LEFT CAROTID ARTERY: Less severe intimal thickening and hypoechoic atherosclerosis. No hemodynamically significant left ICA stenosis, velocity elevation, or turbulent flow. Degree of narrowing less than 50% by ultrasound criteria. LEFT VERTEBRAL ARTERY:  Normal antegrade flow IMPRESSION: Bilateral carotid atherosclerosis, worse on the right. No hemodynamically significant ICA stenosis. Degree of narrowing less than 50% bilaterally by ultrasound criteria. Patent antegrade vertebral flow bilaterally Electronically Signed   By: Judie Petit.  Shick M.D.   On: 03/01/2020 16:06   ECHOCARDIOGRAM COMPLETE  Result Date: 03/01/2020    ECHOCARDIOGRAM REPORT   Patient Name:   Peter Moore Date of Exam: 03/01/2020 Medical Rec #:  [id number]     Height:       65.0 in Accession #:    [id number]    Weight:       182.0 lb Date of Birth:  07-17-1940    BSA:          1.900 m Patient Age:    78 years      BP:           135/81 mmHg Patient Gender: M             HR:  74 bpm. Exam Location:  [facility] Procedure: 2D Echo, Cardiac Doppler and Color Doppler Indications:     Stroke 434.91  History:         Patient has no prior history of Echocardiogram examinations.                  Previous Myocardial Infarction and CAD; Risk                  Factors:Hypertension and Diabetes.  Sonographer:     Cristela Blue RDCS (AE) Referring Phys:  [id number] Lucile Shutters Diagnosing Phys: Yvonne Kendall MD IMPRESSIONS  1. Left ventricular ejection fraction, by estimation, is 60 to 65%. The left ventricle has normal function. The left ventricle demonstrates regional wall motion abnormalities (see scoring diagram/findings for description). There is moderate left ventricular hypertrophy. Left ventricular diastolic parameters  are consistent with Grade I diastolic dysfunction (impaired relaxation). Elevated left atrial pressure. There is mild hypokinesis of the left ventricular, basal inferior wall and inferolateral wall.  2. Right ventricular systolic function is normal. The right ventricular size is normal. Mildly increased right ventricular wall thickness. There is normal pulmonary artery systolic pressure.  3. The mitral valve is grossly normal. No evidence of mitral valve regurgitation. No evidence of mitral stenosis.  4. The aortic valve has an indeterminant number of cusps. There is moderate calcification of the aortic valve. There is moderate thickening of the aortic valve. Aortic valve regurgitation is trivial. Mild to moderate aortic valve stenosis.  5. The inferior vena cava is normal in size with greater than 50% respiratory variability, suggesting right atrial pressure of 3 mmHg. FINDINGS  Left Ventricle: Left ventricular ejection fraction, by estimation, is 60 to 65%. The left ventricle has normal function. The left ventricle demonstrates regional wall motion abnormalities. Mild hypokinesis of the left ventricular, basal inferior wall and inferolateral wall. The left ventricular internal cavity size was normal in size. There is moderate left ventricular hypertrophy. Left ventricular diastolic parameters are consistent with Grade I diastolic dysfunction (impaired relaxation). Elevated left atrial pressure. Right Ventricle: The right ventricular size is normal. Mildly increased right ventricular wall thickness. Right ventricular systolic function is normal. There is normal pulmonary artery systolic pressure. The tricuspid regurgitant velocity is 2.59 m/s, and with an assumed right atrial pressure of 3 mmHg, the estimated right ventricular systolic pressure is 29.8 mmHg. Left Atrium: Left atrial size was normal in size. Right Atrium: Right atrial size was normal in size. Pericardium: There is no evidence of pericardial  effusion. Mitral Valve: The mitral valve is grossly normal. No evidence of mitral valve regurgitation. No evidence of mitral valve stenosis. Tricuspid Valve: The tricuspid valve is grossly normal. Tricuspid valve regurgitation is mild. Aortic Valve: The aortic valve has an indeterminant number of cusps. There is moderate calcification of the aortic valve. There is moderate thickening of the aortic valve. Aortic valve regurgitation is trivial. Mild to moderate aortic stenosis is present. Aortic valve mean gradient measures 16.0 mmHg. Aortic valve peak gradient measures 26.2 mmHg. Aortic valve area, by VTI measures 1.42 cm. Pulmonic Valve: The pulmonic valve was not well visualized. Pulmonic valve regurgitation is not visualized. No evidence of pulmonic stenosis. Aorta: The aortic root is normal in size and structure. Pulmonary Artery: The pulmonary artery is of normal size. Venous: The inferior vena cava is normal in size with greater than 50% respiratory variability, suggesting right atrial pressure of 3 mmHg. IAS/Shunts: The interatrial septum was not well visualized.  LEFT VENTRICLE PLAX  2D LVIDd:         3.57 cm  Diastology LVIDs:         2.13 cm  LV e' medial:    4.03 cm/s LV PW:         1.26 cm  LV E/e' medial:  21.5 LV IVS:        0.97 cm  LV e' lateral:   6.74 cm/s LVOT diam:     2.00 cm  LV E/e' lateral: 12.8 LV SV:         77 LV SV Index:   41 LVOT Area:     3.14 cm  RIGHT VENTRICLE RV Basal diam:  3.83 cm RV S prime:     14.50 cm/s TAPSE (M-mode): 2.4 cm LEFT ATRIUM             Index       RIGHT ATRIUM           Index LA diam:        2.90 cm 1.53 cm/m  RA Area:     14.10 cm LA Vol (A2C):   21.8 ml 11.47 ml/m RA Volume:   36.30 ml  19.10 ml/m LA Vol (A4C):   21.9 ml 11.52 ml/m LA Biplane Vol: 23.9 ml 12.58 ml/m  AORTIC VALVE                    PULMONIC VALVE AV Area (Vmax):    1.19 cm     PV Vmax:        0.97 m/s AV Area (Vmean):   1.19 cm     PV Peak grad:   3.7 mmHg AV Area (VTI):     1.42 cm      RVOT Peak grad: 5 mmHg AV Vmax:           256.00 cm/s AV Vmean:          188.667 cm/s AV VTI:            0.541 m AV Peak Grad:      26.2 mmHg AV Mean Grad:      16.0 mmHg LVOT Vmax:         97.30 cm/s LVOT Vmean:        71.300 cm/s LVOT VTI:          0.245 m LVOT/AV VTI ratio: 0.45  AORTA Ao Root diam: 3.10 cm MITRAL VALVE                TRICUSPID VALVE MV Area (PHT): 2.67 cm     TR Peak grad:   26.8 mmHg MV Decel Time: 284 msec     TR Vmax:        259.00 cm/s MV E velocity: 86.50 cm/s MV A velocity: 122.00 cm/s  SHUNTS MV E/A ratio:  0.71         Systemic VTI:  0.24 m                             Systemic Diam: 2.00 cm Yvonne Kendall MD Electronically signed by Yvonne Kendall MD Signature Date/Time: 03/01/2020/6:45:33 PM    Final      Subjective: Patient was feeling better when seen and examined today.  Having some word finding difficulties which is chronic.  Per patient he had similar seizure-like activity during prior stroke.  Denies any other seizures.  Discussed about driving restrictions and follow-up with neurology for further recommendations.  He wants to go home.  Discharge Exam: Vitals:   03/03/20 0402 03/03/20 0821  BP: 118/65 130/81  Pulse: 66 72  Resp: 20 18  Temp: 97.9 F (36.6 C) 98.4 F (36.9 C)  SpO2: 97% 98%   Vitals:   03/02/20 1540 03/02/20 2004 03/03/20 0402 03/03/20 0821  BP: 114/80 133/75 118/65 130/81  Pulse: 81 62 66 72  Resp: 19 18 20 18   Temp: 97.7 F (36.5 C) 97.7 F (36.5 C) 97.9 F (36.6 C) 98.4 F (36.9 C)  TempSrc: Oral Oral  Oral  SpO2: 100% 100% 97% 98%  Weight:   83.6 kg   Height:        General: Pt is alert, awake, not in acute distress Cardiovascular: RRR, S1/S2 +, no rubs, no gallops Respiratory: CTA bilaterally, no wheezing, no rhonchi Abdominal: Soft, NT, ND, bowel sounds + Extremities: no edema, no cyanosis   The results of significant diagnostics from this hospitalization (including imaging, microbiology, ancillary and laboratory)  are listed below for reference.    Microbiology: Recent Results (from the past 240 hour(s))  Respiratory Panel by RT PCR (Flu A&B, Covid) - Nasopharyngeal Swab     Status: None   Collection Time: 03/01/20  9:44 AM   Specimen: Nasopharyngeal Swab  Result Value Ref Range Status   SARS Coronavirus 2 by RT PCR NEGATIVE NEGATIVE Final    Comment: (NOTE) SARS-CoV-2 target nucleic acids are NOT DETECTED.  The SARS-CoV-2 RNA is generally detectable in upper respiratoy specimens during the acute phase of infection. The lowest concentration of SARS-CoV-2 viral copies this assay can detect is 131 copies/mL. A negative result does not preclude SARS-Cov-2 infection and should not be used as the sole basis for treatment or other patient management decisions. A negative result may occur with  improper specimen collection/handling, submission of specimen other than nasopharyngeal swab, presence of viral mutation(s) within the areas targeted by this assay, and inadequate number of viral copies (<131 copies/mL). A negative result must be combined with clinical observations, patient history, and epidemiological information. The expected result is Negative.  Fact Sheet for Patients:  03/03/20  Fact Sheet for Healthcare Providers:  https://www.moore.com/  This test is no t yet approved or cleared by the https://www.young.biz/ FDA and  has been authorized for detection and/or diagnosis of SARS-CoV-2 by FDA under an Emergency Use Authorization (EUA). This EUA will remain  in effect (meaning this test can be used) for the duration of the COVID-19 declaration under Section 564(b)(1) of the Act, 21 U.S.C. section 360bbb-3(b)(1), unless the authorization is terminated or revoked sooner.     Influenza A by PCR NEGATIVE NEGATIVE Final   Influenza B by PCR NEGATIVE NEGATIVE Final    Comment: (NOTE) The Xpert Xpress SARS-CoV-2/FLU/RSV assay is intended as an aid  in  the diagnosis of influenza from Nasopharyngeal swab specimens and  should not be used as a sole basis for treatment. Nasal washings and  aspirates are unacceptable for Xpert Xpress SARS-CoV-2/FLU/RSV  testing.  Fact Sheet for Patients: [place]  Fact Sheet for Healthcare Providers: https://www.moore.com/  This test is not yet approved or cleared by the https://www.young.biz/ FDA and  has been authorized for detection and/or diagnosis of SARS-CoV-2 by  FDA under an Emergency Use Authorization (EUA). This EUA will remain  in effect (meaning this test can be used) for the duration of the  Covid-19 declaration under Section 564(b)(1) of the Act, 21  U.S.C. section 360bbb-3(b)(1), unless the authorization is  terminated  or revoked. Performed at [facility], [address] [place], [place] [address]      Labs: BNP (last 3 results) No results for input(s): BNP in the last 8760 hours. Basic Metabolic Panel: Recent Labs  Lab 03/01/20 0850 03/03/20 0405  NA 139 140  K 3.5 3.5  CL 100 103  CO2 26 27  GLUCOSE 159* 86  BUN 18 19  CREATININE 1.41* 1.08  CALCIUM 9.4 9.2  MG  --  2.2   Liver Function Tests: Recent Labs  Lab 03/01/20 0850  AST 21  ALT 12  ALKPHOS 43  BILITOT 0.8  PROT 7.3  ALBUMIN 4.0   No results for input(s): LIPASE, AMYLASE in the last 168 hours. Recent Labs  Lab 03/02/20 0332  AMMONIA 33   CBC: Recent Labs  Lab 03/01/20 0850 03/03/20 0405  WBC 10.4 9.3  NEUTROABS 7.4  --   HGB 13.6 13.1  HCT 41.3 38.9*  MCV 87.3 87.6  PLT 243 215   Cardiac Enzymes: No results for input(s): CKTOTAL, CKMB, CKMBINDEX, TROPONINI in the last 168 hours. BNP: Invalid input(s): POCBNP CBG: Recent Labs  Lab 03/02/20 0921 03/02/20 1621 03/02/20 2033 03/02/20 2317 03/03/20 0401  GLUCAP 89 133* 84 81 88   D-Dimer No results for input(s): DDIMER in the last 72 hours. Hgb A1c Recent Labs     03/01/20 0850 03/02/20 0331  HGBA1C 6.0* 5.9*   Lipid Profile Recent Labs    03/02/20 0331  CHOL 97  HDL 34*  LDLCALC 48  TRIG 73  CHOLHDL 2.9   Thyroid function studies No results for input(s): TSH, T4TOTAL, T3FREE, THYROIDAB in the last 72 hours.  Invalid input(s): FREET3 Anemia work up No results for input(s): VITAMINB12, FOLATE, FERRITIN, TIBC, IRON, RETICCTPCT in the last 72 hours. Urinalysis    Component Value Date/Time   COLORURINE YELLOW (A) 03/01/2020 0944   APPEARANCEUR CLEAR (A) 03/01/2020 0944   LABSPEC 1.014 03/01/2020 0944   PHURINE 7.0 03/01/2020 0944   GLUCOSEU NEGATIVE 03/01/2020 0944   HGBUR NEGATIVE 03/01/2020 0944   BILIRUBINUR NEGATIVE 03/01/2020 0944   KETONESUR NEGATIVE 03/01/2020 0944   PROTEINUR NEGATIVE 03/01/2020 0944   NITRITE NEGATIVE 03/01/2020 0944   LEUKOCYTESUR NEGATIVE 03/01/2020 0944   Sepsis Labs Invalid input(s): PROCALCITONIN,  WBC,  LACTICIDVEN Microbiology Recent Results (from the past 240 hour(s))  Respiratory Panel by RT PCR (Flu A&B, Covid) - Nasopharyngeal Swab     Status: None   Collection Time: 03/01/20  9:44 AM   Specimen: Nasopharyngeal Swab  Result Value Ref Range Status   SARS Coronavirus 2 by RT PCR NEGATIVE NEGATIVE Final    Comment: (NOTE) SARS-CoV-2 target nucleic acids are NOT DETECTED.  The SARS-CoV-2 RNA is generally detectable in upper respiratoy specimens during the acute phase of infection. The lowest concentration of SARS-CoV-2 viral copies this assay can detect is 131 copies/mL. A negative result does not preclude SARS-Cov-2 infection and should not be used as the sole basis for treatment or other patient management decisions. A negative result may occur with  improper specimen collection/handling, submission of specimen other than nasopharyngeal swab, presence of viral mutation(s) within the areas targeted by this assay, and inadequate number of viral copies (<131 copies/mL). A negative result must  be combined with clinical observations, patient history, and epidemiological information. The expected result is Negative.  Fact Sheet for Patients:  https://www.moore.com/  Fact Sheet for Healthcare Providers:  https://www.young.biz/  This test is no t yet approved or cleared by  the [employer] and  has been authorized for detection and/or diagnosis of SARS-CoV-2 by FDA under an Emergency Use Authorization (EUA). This EUA will remain  in effect (meaning this test can be used) for the duration of the COVID-19 declaration under Section 564(b)(1) of the Act, 21 U.S.C. section 360bbb-3(b)(1), unless the authorization is terminated or revoked sooner.     Influenza A by PCR NEGATIVE NEGATIVE Final   Influenza B by PCR NEGATIVE NEGATIVE Final    Comment: (NOTE) The Xpert Xpress SARS-CoV-2/FLU/RSV assay is intended as an aid in  the diagnosis of influenza from Nasopharyngeal swab specimens and  should not be used as a sole basis for treatment. Nasal washings and  aspirates are unacceptable for Xpert Xpress SARS-CoV-2/FLU/RSV  testing.  Fact Sheet for Patients: https://www.moore.com/  Fact Sheet for Healthcare Providers: https://www.young.biz/  This test is not yet approved or cleared by the [place] FDA and  has been authorized for detection and/or diagnosis of SARS-CoV-2 by  FDA under an Emergency Use Authorization (EUA). This EUA will remain  in effect (meaning this test can be used) for the duration of the  Covid-19 declaration under Section 564(b)(1) of the Act, 21  U.S.C. section 360bbb-3(b)(1), unless the authorization is  terminated or revoked. Performed at [facility], [address] [place], [place] [address]     Time coordinating discharge: Over 30 minutes  SIGNED:  Arnetha Courser, MD  [facility] 03/03/2020, 11:00 AM  If 7PM-7AM, please contact  night-coverage [email or web address]  This record has been created using Conservation officer, historic buildings. Errors have been sought and corrected,but may not always be located. Such creation errors do not reflect on the standard of care.

## 2020-03-03 NOTE — Plan of Care (Signed)

## 2020-03-03 NOTE — TOC Transition Note (Signed)
Transition of Care [facility]) - CM/SW Discharge Note   Patient Details  Name: Martise Waddell MRN: [id number] Date of Birth: 10/26/1940  [facility] of [facility]) CM/SW Contact:  Maree Krabbe, LCSW Phone Number: 03/03/2020, 2:06 PM   Clinical Narrative: HH arranged through [facility]. Pt's spouse will pick pt up. NO additional needs at this time.      Final next level of care: Home w Home Health Services Barriers to Discharge: No Barriers Identified   Patient Goals and CMS Choice Patient states their goals for this hospitalization and ongoing recovery are:: to get better   Choice offered to / list presented to : Spouse, Patient  Discharge Placement                Patient to be transferred to facility by: pt's spouse   Patient and family notified of of transfer: 03/03/20  Discharge Plan and Services     Post Acute Care Choice: Home Health                    HH Arranged: PT [facility] Agency: [facility] Date [facility] Agency Contacted: 03/03/20 Time HH Agency Contacted: [id number] Representative spoke with at [facility] Agency: sara  Social Determinants of Health (SDOH) Interventions     Readmission Risk Interventions No flowsheet data found.

## 2020-03-03 NOTE — Evaluation (Signed)
Speech Language Pathology Evaluation Patient Details Name: Peter Moore MRN: [id number] DOB: 11/01/1940 Today's Date: 03/03/2020 Time: 5726-2035 SLP Time Calculation (min) (ACUTE ONLY): 17 min  Problem List:  Patient Active Problem List   Diagnosis Date Noted  . Seizure (HCC) 03/01/2020  . Coronary artery disease   . Diabetes mellitus without complication (HCC)   . Hypertension   . CVA (cerebral vascular accident) (HCC)   . Depression    Past Medical History:  Past Medical History:  Diagnosis Date  . Coronary artery disease   . Diabetes mellitus without complication (HCC)   . Hypertension   . Myocardial infarction [facility])    Past Surgical History:  Past Surgical History:  Procedure Laterality Date  . CARDIAC SURGERY    . TONSILLECTOMY     HPI:  79 year old male with a history of a CVA with expressive aphasia, history of coronary artery disease, history of diabetes mellitus who was brought in by EMS for evaluation of what appears to be new onset seizure.  Patient had an MRI of the brain which shows a small subacute infarct of the left corona radiata   Assessment / Plan / Recommendation Clinical Impression  Pt presents with moderate to severe expressive aphasia. His verbal expression consists of semantic paraphasias with decreased ability to self-correct. Pt's responses to yes/no questions are accurate and recommend staff utilize this form of communicate to help pt express his wants/needs. Per pt and chart review, pt's expressive is not acute but is at chronic baseline. Pt and wife requested discharge from Outpatient ST services on 09/10/2019. Pt is aware that he can request another referral for Outpatient ST if they choose. Skilled ST intervention is not indicated during acute admission.     SLP Assessment  SLP Recommendation/Assessment: Patient does not need any further Speech Lanaguage Pathology Services SLP Visit Diagnosis: Aphasia (R47.01)    Follow Up Recommendations    (per pt's discretion)    Frequency and Duration   N/A        SLP Evaluation Cognition  Overall Cognitive Status: Within Functional Limits for tasks assessed Arousal/Alertness: Awake/alert Orientation Level: Oriented X4       Comprehension  Auditory Comprehension Overall Auditory Comprehension: Appears within functional limits for tasks assessed Visual Recognition/Discrimination Discrimination: Not tested Reading Comprehension Reading Status: Not tested    Expression Expression Primary Mode of Expression: Verbal Verbal Expression Overall Verbal Expression: Impaired at baseline Written Expression Dominant Hand: Right Written Expression: Not tested   Oral / Motor  Oral Motor/Sensory Function Overall Oral Motor/Sensory Function: Within functional limits Motor Speech Overall Motor Speech: Appears within functional limits for tasks assessed   GO                   Peter Moore M.S., CCC-SLP, [facility] Speech-Language Pathologist Rehabilitation Services Office [phone]  Peter Moore 03/03/2020, 10:47 AM

## 2020-08-09 ENCOUNTER — Emergency Department: Payer: Medicare HMO

## 2020-08-09 ENCOUNTER — Other Ambulatory Visit: Payer: Self-pay

## 2020-08-09 ENCOUNTER — Inpatient Hospital Stay
Admission: EM | Admit: 2020-08-09 | Discharge: 2020-08-17 | DRG: 312 | Disposition: A | Discharge status: Skilled Nursing Facility | Payer: Medicare HMO | Attending: Internal Medicine | Admitting: Internal Medicine

## 2020-08-09 DIAGNOSIS — G40909 Epilepsy, unspecified, not intractable, without status epilepticus: Secondary | ICD-10-CM | POA: Diagnosis present

## 2020-08-09 DIAGNOSIS — W1830XA Fall on same level, unspecified, initial encounter: Secondary | ICD-10-CM | POA: Diagnosis present

## 2020-08-09 DIAGNOSIS — Y92019 Unspecified place in single-family (private) house as the place of occurrence of the external cause: Secondary | ICD-10-CM

## 2020-08-09 DIAGNOSIS — I69351 Hemiplegia and hemiparesis following cerebral infarction affecting right dominant side: Secondary | ICD-10-CM

## 2020-08-09 DIAGNOSIS — E861 Hypovolemia: Secondary | ICD-10-CM | POA: Diagnosis present

## 2020-08-09 DIAGNOSIS — Z7984 Long term (current) use of oral hypoglycemic drugs: Secondary | ICD-10-CM

## 2020-08-09 DIAGNOSIS — Z79899 Other long term (current) drug therapy: Secondary | ICD-10-CM

## 2020-08-09 DIAGNOSIS — E860 Dehydration: Secondary | ICD-10-CM | POA: Diagnosis present

## 2020-08-09 DIAGNOSIS — N179 Acute kidney failure, unspecified: Secondary | ICD-10-CM | POA: Diagnosis present

## 2020-08-09 DIAGNOSIS — I951 Orthostatic hypotension: Principal | ICD-10-CM | POA: Diagnosis present

## 2020-08-09 DIAGNOSIS — I10 Essential (primary) hypertension: Secondary | ICD-10-CM | POA: Diagnosis present

## 2020-08-09 DIAGNOSIS — Z7902 Long term (current) use of antithrombotics/antiplatelets: Secondary | ICD-10-CM

## 2020-08-09 DIAGNOSIS — I252 Old myocardial infarction: Secondary | ICD-10-CM

## 2020-08-09 DIAGNOSIS — E119 Type 2 diabetes mellitus without complications: Secondary | ICD-10-CM | POA: Diagnosis present

## 2020-08-09 DIAGNOSIS — S0003XA Contusion of scalp, initial encounter: Secondary | ICD-10-CM | POA: Diagnosis present

## 2020-08-09 DIAGNOSIS — I2510 Atherosclerotic heart disease of native coronary artery without angina pectoris: Secondary | ICD-10-CM | POA: Diagnosis present

## 2020-08-09 DIAGNOSIS — Z888 Allergy status to other drugs, medicaments and biological substances status: Secondary | ICD-10-CM

## 2020-08-09 DIAGNOSIS — K068 Other specified disorders of gingiva and edentulous alveolar ridge: Secondary | ICD-10-CM | POA: Diagnosis present

## 2020-08-09 DIAGNOSIS — I69320 Aphasia following cerebral infarction: Secondary | ICD-10-CM

## 2020-08-09 DIAGNOSIS — F17210 Nicotine dependence, cigarettes, uncomplicated: Secondary | ICD-10-CM | POA: Diagnosis present

## 2020-08-09 DIAGNOSIS — Z7982 Long term (current) use of aspirin: Secondary | ICD-10-CM

## 2020-08-09 DIAGNOSIS — G20 Parkinson's disease: Secondary | ICD-10-CM | POA: Diagnosis present

## 2020-08-09 DIAGNOSIS — D649 Anemia, unspecified: Secondary | ICD-10-CM | POA: Diagnosis present

## 2020-08-09 DIAGNOSIS — Z20822 Contact with and (suspected) exposure to covid-19: Secondary | ICD-10-CM | POA: Diagnosis present

## 2020-08-09 DIAGNOSIS — S0990XA Unspecified injury of head, initial encounter: Secondary | ICD-10-CM

## 2020-08-09 DIAGNOSIS — W19XXXA Unspecified fall, initial encounter: Secondary | ICD-10-CM

## 2020-08-09 DIAGNOSIS — R531 Weakness: Secondary | ICD-10-CM

## 2020-08-09 DIAGNOSIS — F0280 Dementia in other diseases classified elsewhere without behavioral disturbance: Secondary | ICD-10-CM | POA: Diagnosis present

## 2020-08-09 MED ORDER — SODIUM CHLORIDE 0.9 % IV BOLUS (SEPSIS)
500.0000 mL | Freq: Once | INTRAVENOUS | Status: AC
  Administered 2020-08-10: 500 mL via INTRAVENOUS

## 2020-08-09 NOTE — ED Notes (Signed)
Late entry -- EMS reports pt had bowel incontinence prior to arrival - will need pericare and change of clothes.  Also arrives with bandage to R hand noted to have old dried blood - ems states this is related to a fall "a few days ago"

## 2020-08-09 NOTE — ED Provider Notes (Signed)
[facility] Emergency Department Provider Note  ____________________________________________   Event Date/Time   First MD Initiated Contact with Patient 08/09/20 2300     (approximate)  I have reviewed the triage vital signs and the nursing notes.   HISTORY  Chief Complaint Weakness and Dental Problem    HPI Peter Moore is a 80 y.o. 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  Patient's wife provides the history.  She reports over the past month he has been getting progressively weaker.  She states today at 9 AM he saw a dentist in [place] and had to lower molars on either side extracted.  This afternoon he started having bleeding from this area that worsened every time he got up to walk.  They called the dentist office several times.  She states after the fourth time of getting up to go to the bathroom he was so weak he could barely do it.  She states he normally ambulates with a walker.  She reports he did have a fall yesterday where he hit his head.  He was using his walker at that time.  No loss of consciousness.  He has been off of Plavix since the beginning of March.  States symptoms have worsened since he was diagnosed with Parkinson's 07/29/19 and started on carbidopa levodopa.  She denies any known fevers, vomiting or diarrhea.  He has a wet cough today since all of the bleeding from his gums.        Past Medical History:  Diagnosis Date  . Coronary artery disease   . Diabetes mellitus without complication (HCC)   . Hypertension   . Myocardial infarction [facility](HCC)     Patient Active Problem List   Diagnosis Date Noted  . Orthostatic hypotension 08/10/2020  . Seizure (HCC) 03/01/2020  . Coronary artery disease   . Diabetes mellitus without complication (HCC)   . Hypertension   . CVA (cerebral vascular  accident) (HCC)   . Depression     Past Surgical History:  Procedure Laterality Date  . CARDIAC SURGERY    . TONSILLECTOMY      Prior to Admission medications   Medication Sig Start Date End Date Taking? Authorizing Provider  acetaminophen (TYLENOL) 325 MG tablet Take 650 mg by mouth every 6 (six) hours as needed for pain or fever. 09/05/18   [provider]  aspirin EC 81 MG tablet Take 81 mg by mouth daily.    [provider]  atorvastatin (LIPITOR) 40 MG tablet Take 40 mg by mouth at bedtime. 02/17/20   [provider]  carbidopa-levodopa (SINEMET IR) 25-100 MG tablet Take 2 tablets by mouth 3 (three) times daily. 07/28/20   [provider]  cetirizine (ZYRTEC) 10 MG tablet Take 10 mg by mouth 2 (two) times daily.    [provider]  clopidogrel (PLAVIX) 75 MG tablet Take 1 tablet (75 mg total) by mouth daily. 03/04/20   Arnetha CourserAmin, Sumayya, MD  divalproex (DEPAKOTE) 500 MG DR tablet Take 500 mg by mouth 3 (three) times daily. 07/05/20   [provider]  hydrochlorothiazide (HYDRODIURIL) 25 MG tablet Take 25 mg by mouth daily. 01/28/20   [provider]  losartan (COZAAR) 100 MG tablet Take 100 mg by mouth daily.    [provider]  metFORMIN (GLUCOPHAGE) 500 MG tablet Take 500 mg by  mouth 2 (two) times daily with a meal. 09/16/18   [provider]  sertraline (ZOLOFT) 25 MG tablet Take 25 mg by mouth daily. 12/23/19   [provider]  valproic acid (DEPAKENE) 250 MG capsule Take 2 capsules (500 mg total) by mouth 3 (three) times daily. 03/03/20   Arnetha Courser, MD    Allergies Clobetasol  No family history on file.  Social History Social History   Tobacco Use  . Smoking status: Current Every Day Smoker    Types: Cigarettes  . Smokeless tobacco: Never Used  Vaping Use  . Vaping Use: Never used  Substance Use Topics  . Alcohol use: No    Review of Systems Level 5 caveat secondary to aphasia from  previous stroke  ____________________________________________   PHYSICAL EXAM:  VITAL SIGNS: ED Triage Vitals  Enc Vitals Group     BP 08/09/20 2301 124/73     Pulse Rate 08/09/20 2301 82     Resp --      Temp 08/09/20 2301 97.6 F (36.4 C)     Temp Source 08/09/20 2301 Oral     SpO2 08/09/20 2301 98 %     Weight --      Height --      Head Circumference --      Peak Flow --      Pain Score 08/09/20 2303 0     Pain Loc --      Pain Edu? --      Excl. in GC? --    CONSTITUTIONAL: Alert but unable to answer questions.  Can follow some commands.  When asked any questions patient states "1984". HEAD: Normocephalic, right posterior scalp swelling EYES: Conjunctivae clear, pupils appear equal, EOM appear intact ENT: normal nose; moist mucous membranes, dried blood noted to his lips, patient has large clots to areas of recent dental extraction to the lower gums but no active bleeding NECK: Supple, normal ROM midline spinal tenderness or step-off or deformity CARD: RRR; S1 and S2 appreciated; no murmurs, no clicks, no rubs, no gallops RESP: Normal chest excursion without splinting or tachypnea; breath sounds clear and equal bilaterally; no wheezes, no rhonchi, no rales, no hypoxia or respiratory distress, speaking full sentences ABD/GI: Normal bowel sounds; non-distended; soft, non-tender, no rebound, no guarding, no peritoneal signs, no hepatosplenomegaly BACK: The back appears normal, no midline spinal tenderness or step-off or deformity EXT: Normal ROM in all joints; no deformity noted, no edema; no cyanosis SKIN: Normal color for age and race; warm; no rash on exposed skin NEURO: Right upper extremity weakness which is baseline.  Otherwise moves extremities equally.  No facial asymmetry.  Aphasic at baseline.  ____________________________________________   LABS (all labs ordered are listed, but only abnormal results are displayed)  Labs Reviewed  CBC WITH  DIFFERENTIAL/PLATELET - Abnormal; Notable for the following components:      Result Value   RBC 3.89 (*)    Hemoglobin 12.3 (*)    HCT 38.8 (*)    Platelets 147 (*)    Monocytes Absolute 1.3 (*)    Abs Immature Granulocytes 0.09 (*)    All other components within normal limits  COMPREHENSIVE METABOLIC PANEL - Abnormal; Notable for the following components:   Glucose, Bld 167 (*)    BUN 33 (*)    Creatinine, Ser 1.47 (*)    Albumin 3.1 (*)    Alkaline Phosphatase 36 (*)    GFR, Estimated 49 (*)    All other components  within normal limits  URINALYSIS, ROUTINE W REFLEX MICROSCOPIC - Abnormal; Notable for the following components:   Color, Urine YELLOW (*)    APPearance HAZY (*)    Hgb urine dipstick SMALL (*)    Ketones, ur 20 (*)    Protein, ur 30 (*)    All other components within normal limits  CBG MONITORING, ED - Abnormal; Notable for the following components:   Glucose-Capillary 128 (*)    All other components within normal limits  URINE CULTURE  RESP PANEL BY RT-PCR (FLU A&B, COVID) ARPGX2  PROTIME-INR  MAGNESIUM  VALPROIC ACID LEVEL  TROPONIN I (HIGH SENSITIVITY)  TROPONIN I (HIGH SENSITIVITY)   ____________________________________________  EKG   EKG Interpretation  Date/Time:  Tuesday August 09 2020 23:00:27 EDT Ventricular Rate:  82 PR Interval:  140 QRS Duration: 81 QT Interval:  448 QTC Calculation: 524 R Axis:   66 Text Interpretation: Sinus rhythm Low voltage, precordial leads Borderline T abnormalities, lateral leads ST elevation, consider inferior injury Prolonged QT interval Confirmed by Rochele Raring [phone]) on 08/09/2020 11:25:10 PM       ____________________________________________  RADIOLOGY Normajean Baxter Chalice Philbert, personally viewed and evaluated these images (plain radiographs) as part of my medical decision making, as well as reviewing the written report by the radiologist.  ED MD interpretation: CT head shows posterior scalp hematoma.  No other  acute abnormality seen on CT head or cervical spine.  Chest x-ray is clear.  Official radiology report(s): CT Head Wo Contrast  Result Date: 08/10/2020 CLINICAL DATA:  Altered mental status EXAM: CT HEAD WITHOUT CONTRAST TECHNIQUE: Contiguous axial images were obtained from the base of the skull through the vertex without intravenous contrast. COMPARISON:  03/01/2020 FINDINGS: CT HEAD FINDINGS Brain: Extensive and small full malacia within the left frontal and temporal lobes is again identified as well as the insular cortex related to remote left MCA territory infarct. Remote lacunar infarct noted within the left basal ganglia. Mild diffuse parenchymal volume loss is commensurate with the patient's age. Mild periventricular white matter changes are present likely reflecting the sequela of small vessel ischemia. No abnormal intra or extra-axial mass lesion or fluid collection. No abnormal mass effect or midline shift. No evidence of acute intracranial hemorrhage or infarct. Ventricular size is normal. Cerebellum is unremarkable. Vascular: Extensive vascular calcifications noted within the carotid siphons. No asymmetric hyperdense vasculature at the skull base. Skull: The calvarium is intact. Sinuses/Orbits: The orbits are unremarkable. The paranasal sinuses are clear. Other: Mastoid air cells and middle ear cavities are clear. Moderate right parieto-occipital scalp hematoma is present IMPRESSION: Moderate right parieto-occipital scalp hematoma. No calvarial fracture. No acute intracranial injury. Chronic left MCA territory infarct. Extensive encephalomalacia, stable since prior examination 03/01/2020. Electronically Signed   By: Helyn Numbers MD   On: 08/10/2020 00:17   CT Cervical Spine Wo Contrast  Result Date: 08/10/2020 CLINICAL DATA:  Altered mental status, generalized weakness, progressive right-sided weakness, neck trauma EXAM: CT HEAD WITHOUT CONTRAST CT CERVICAL SPINE WITHOUT CONTRAST TECHNIQUE:  Multidetector CT imaging of the head and cervical spine was performed following the standard protocol without intravenous contrast. Multiplanar CT image reconstructions of the cervical spine were also generated. COMPARISON:  03/01/2020 FINDINGS: CT HEAD FINDINGS Brain: Extensive and small full malacia within the left frontal and temporal lobes is again identified as well as the insular cortex related to remote left MCA territory infarct. Remote lacunar infarct noted within the left basal ganglia. Mild diffuse parenchymal volume loss is commensurate with  the patient's age. Mild periventricular white matter changes are present likely reflecting the sequela of small vessel ischemia. No abnormal intra or extra-axial mass lesion or fluid collection. No abnormal mass effect or midline shift. No evidence of acute intracranial hemorrhage or infarct. Ventricular size is normal. Cerebellum is unremarkable. Vascular: Extensive vascular calcifications noted within the carotid siphons. No asymmetric hyperdense vasculature at the skull base. Skull: The calvarium is intact. Sinuses/Orbits: The orbits are unremarkable. The paranasal sinuses are clear. Other: Mastoid air cells and middle ear cavities are clear. Moderate right parieto-occipital scalp hematoma is present CT CERVICAL SPINE FINDINGS Alignment: Normal cervical lordosis.  No listhesis peer Skull base and vertebrae: The craniocervical junction is unremarkable. The atlantodental interval is normal. No acute fracture of the cervical spine. No lytic or blastic bone lesion. Soft tissues and spinal canal: No prevertebral fluid or swelling. No visible canal hematoma. Disc levels: There is mild intervertebral disc space narrowing and endplate remodeling at C5-6 and C6-7 in keeping with changes of mild degenerative disc disease. Remaining intervertebral disc heights are preserved. Vertebral body heights are preserved. The spinal canal is widely patent. The prevertebral soft  tissues are not thickened on sagittal reformats. Multilevel facet arthrosis results in mild multilevel neuroforaminal narrowing, most severe on the left at C3-4 and on the right at C4-5. Upper chest: Visualized lung apices are clear bilaterally. Other: None IMPRESSION: Moderate right parieto-occipital scalp hematoma. No calvarial fracture. No acute intracranial injury. Chronic left MCA territory infarct with extensive encephalomalacia as described above, stable since prior examination. No acute fracture or listhesis of the cervical spine. Electronically Signed   By: Helyn Numbers MD   On: 08/10/2020 00:14   DG Chest Portable 1 View  Result Date: 08/09/2020 CLINICAL DATA:  Cough, altered mental status. EXAM: PORTABLE CHEST 1 VIEW COMPARISON:  Chest x-ray 05/15/2017 FINDINGS: Aortic arch calcifications.  Coronary artery stents. The heart size and mediastinal contours are within normal limits. No focal consolidation. No pulmonary edema. No pleural effusion. No pneumothorax. No acute osseous abnormality. IMPRESSION: No active disease. Electronically Signed   By: Tish Frederickson M.D.   On: 08/09/2020 23:37    ____________________________________________   PROCEDURES  Procedure(s) performed (including Critical Care):  Procedures   ____________________________________________   INITIAL IMPRESSION / ASSESSMENT AND PLAN / ED COURSE  As part of my medical decision making, I reviewed the following data within the electronic MEDICAL RECORD NUMBER History obtained from family, Nursing notes reviewed and incorporated, Labs reviewed , EKG interpreted , Old EKG reviewed, Old chart reviewed, Radiograph reviewed, CTs reviewed and Notes from prior ED visits         Patient here with worsening generalized weakness over the past month that has now progressively worsened in the last day along with significant bleeding from his gums.  He is not bleeding actively at this time.  There are clots present over the areas  where he had 2 molars extracted this morning by his dentist.  We will continue to monitor this area closely.  Will use nebulized TXA as needed.  On my evaluation, patient does have a soft blood pressure in the 90s.  Will give IV fluids.  Will evaluate for any signs of infection, anemia, electrolyte derangement, dehydration.  Also obtain CT of his head, cervical spine given recent fall.  He does have a very wet cough on evaluation and may have aspirated blood.  Will obtain chest x-ray.  Seems to be at his neurologic baseline otherwise.  ED PROGRESS  Patient's work-up thus far has been unrevealing.  His hemoglobin is 12.3.  Electrolytes within normal limits.  He does have minimally elevated creatinine but this was also present in October 2021.  He has received gentle hydration here.  His troponin is normal.  CT head and cervical spine show scalp hematoma but no intracranial abnormality or cervical spine fracture.  Chest x-ray is clear.  Urine shows small amount of ketones but again he is receiving fluids.  He does have 6-10 red blood cells and 6-10 white blood cells but no bacteria or other signs of infection.  We will add on urine culture but given pyuria only without bacteriuria, I do not feel this needs to be treated at this time.  He has not had any active bleeding from his gums since arrival to the ED.  Will attempt ambulate in the ED with a walker given this is what he uses at his baseline.  2:30 AM  Repeat troponin flat.  Valproic acid level is therapeutic.  Family is comfortable with plan to be discharged home if he is able to ambulate here.  No sign of active bleeding on exam.  3:45 AM  Pt unable to stand without significant assistance and was unable to ambulate without feeling dizzy and nauseated.  Will check orthostatic vital signs.  He is again oozing minimally from his extraction sites.  Will use nebulized TXA.  Bleeding stops with direct pressure with biting down on gauze.  4:08 AM  Pt has  orthostatic hypotension with standing despite initial IV hydration.  We will continue IV hydration and discuss with hospitalist for admission.   4:24 AM Discussed patient's case with hospitalist, Dr. Arville Care.  I have recommended admission and patient (and family if present) agree with this plan. Admitting physician will place admission orders.   I reviewed all nursing notes, vitals, pertinent previous records and reviewed/interpreted all EKGs, lab and urine results, imaging (as available).   ____________________________________________   FINAL CLINICAL IMPRESSION(S) / ED DIAGNOSES  Final diagnoses:  Generalized weakness  Fall, initial encounter  Injury of head, initial encounter  Bleeding gums  Orthostatic hypotension     ED Discharge Orders    None      *Please note:  Peter Moore was evaluated in Emergency Department on 08/10/2020 for the symptoms described in the history of present illness. He was evaluated in the context of the global COVID-19 pandemic, which necessitated consideration that the patient might be at risk for infection with the SARS-CoV-2 virus that causes COVID-19. Institutional protocols and algorithms that pertain to the evaluation of patients at risk for COVID-19 are in a state of rapid change based on information released by regulatory bodies including the CDC and federal and state organizations. These policies and algorithms were followed during the patient's care in the ED.  Some ED evaluations and interventions may be delayed as a result of limited staffing during and the pandemic.*   Note:  This document was prepared using Dragon voice recognition software and may include unintentional dictation errors.   Tahari Clabaugh, Peter Maw, DO 08/10/20 [phone]

## 2020-08-09 NOTE — ED Notes (Signed)
Pt currently awake and alert; denies pain however GCS 15 and unable to state name, location or reasons to why he was brought to hospital.  Oral bleeding not controlled with gauze applied to sites of teeth extraction.  RR even and unlabored on RA however pt noted to have intermittent wet cough; nonprod at this time.  Cardiac monitoring and pulse ox monitoring maintained.  Pt awaits provider eval.  Will monitor for acute changes and maintain plan of care.

## 2020-08-09 NOTE — ED Triage Notes (Signed)
Pt arrives from home via ems -- per family pt with progressively worsening weakness x3 days; chronic R side weakness secondary to prior CVA but does ambulate with walker at baseline- per family pt has been too weak to ambulate with walker.  Also noted that pt had 2 L lower molars extracted this a.m. - now having uncontrolled bleeding from sites -- pt not on blood thinners.  In addition to h/o CVA pt also with h/o DM; seizures (since CVA) and high chol.  CBG 172 enroute with ems

## 2020-08-09 NOTE — ED Notes (Signed)
EKG given to Dr. Ward 

## 2020-08-10 ENCOUNTER — Encounter: Payer: Self-pay | Admitting: Family Medicine

## 2020-08-10 ENCOUNTER — Other Ambulatory Visit: Payer: Self-pay

## 2020-08-10 DIAGNOSIS — R531 Weakness: Secondary | ICD-10-CM | POA: Diagnosis not present

## 2020-08-10 DIAGNOSIS — K068 Other specified disorders of gingiva and edentulous alveolar ridge: Secondary | ICD-10-CM | POA: Diagnosis not present

## 2020-08-10 DIAGNOSIS — W19XXXA Unspecified fall, initial encounter: Secondary | ICD-10-CM

## 2020-08-10 DIAGNOSIS — I951 Orthostatic hypotension: Principal | ICD-10-CM

## 2020-08-10 DIAGNOSIS — N189 Chronic kidney disease, unspecified: Secondary | ICD-10-CM

## 2020-08-10 DIAGNOSIS — N179 Acute kidney failure, unspecified: Secondary | ICD-10-CM

## 2020-08-10 DIAGNOSIS — S0990XA Unspecified injury of head, initial encounter: Secondary | ICD-10-CM

## 2020-08-10 LAB — HEMOGLOBIN AND HEMATOCRIT, BLOOD
HCT: 33.4 % — ABNORMAL LOW (ref 39.0–52.0)
HCT: 29.4 % — ABNORMAL LOW (ref 39.0–52.0)
HCT: 26.4 % — ABNORMAL LOW (ref 39.0–52.0)
Hemoglobin: 10.8 g/dL — ABNORMAL LOW (ref 13.0–17.0)
Hemoglobin: 9.4 g/dL — ABNORMAL LOW (ref 13.0–17.0)
Hemoglobin: 8.5 g/dL — ABNORMAL LOW (ref 13.0–17.0)

## 2020-08-10 LAB — COMPREHENSIVE METABOLIC PANEL
ALT: 12 U/L (ref 0–44)
AST: 22 U/L (ref 15–41)
Albumin: 3.1 g/dL — ABNORMAL LOW (ref 3.5–5.0)
Alkaline Phosphatase: 36 U/L — ABNORMAL LOW (ref 38–126)
Anion gap: 10 (ref 5–15)
BUN: 33 mg/dL — ABNORMAL HIGH (ref 8–23)
CO2: 24 mmol/L (ref 22–32)
Calcium: 9 mg/dL (ref 8.9–10.3)
Chloride: 103 mmol/L (ref 98–111)
Creatinine, Ser: 1.47 mg/dL — ABNORMAL HIGH (ref 0.61–1.24)
GFR, Estimated: 49 mL/min — ABNORMAL LOW (ref >60)
Glucose, Bld: 167 mg/dL — ABNORMAL HIGH (ref 70–99)
Potassium: 4.5 mmol/L (ref 3.5–5.1)
Sodium: 137 mmol/L (ref 135–145)
Total Bilirubin: 1.2 mg/dL (ref 0.3–1.2)
Total Protein: 6.5 g/dL (ref 6.5–8.1)

## 2020-08-10 LAB — URINALYSIS, ROUTINE W REFLEX MICROSCOPIC
Bacteria, UA: NONE SEEN
Bilirubin Urine: NEGATIVE
Glucose, UA: NEGATIVE mg/dL
Ketones, ur: 20 mg/dL — AB
Leukocytes,Ua: NEGATIVE
Nitrite: NEGATIVE
Protein, ur: 30 mg/dL — AB
Specific Gravity, Urine: 1.024 (ref 1.005–1.030)
Squamous Epithelial / HPF: NONE SEEN (ref 0–5)
pH: 6 (ref 5.0–8.0)

## 2020-08-10 LAB — BASIC METABOLIC PANEL
Anion gap: 6 (ref 5–15)
BUN: 37 mg/dL — ABNORMAL HIGH (ref 8–23)
CO2: 26 mmol/L (ref 22–32)
Calcium: 8.8 mg/dL — ABNORMAL LOW (ref 8.9–10.3)
Chloride: 107 mmol/L (ref 98–111)
Creatinine, Ser: 1.18 mg/dL (ref 0.61–1.24)
GFR, Estimated: >60 mL/min (ref >60)
Glucose, Bld: 99 mg/dL (ref 70–99)
Potassium: 4.4 mmol/L (ref 3.5–5.1)
Sodium: 139 mmol/L (ref 135–145)

## 2020-08-10 LAB — CBG MONITORING, ED: Glucose-Capillary: 128 mg/dL — ABNORMAL HIGH (ref 70–99)

## 2020-08-10 LAB — CBC WITH DIFFERENTIAL/PLATELET
Abs Immature Granulocytes: 0.09 10*3/uL — ABNORMAL HIGH (ref 0.00–0.07)
Basophils Absolute: 0.1 10*3/uL (ref 0.0–0.1)
Basophils Relative: 1 %
Eosinophils Absolute: 0 10*3/uL (ref 0.0–0.5)
Eosinophils Relative: 0 %
HCT: 38.8 % — ABNORMAL LOW (ref 39.0–52.0)
Hemoglobin: 12.3 g/dL — ABNORMAL LOW (ref 13.0–17.0)
Immature Granulocytes: 1 %
Lymphocytes Relative: 15 %
Lymphs Abs: 1.6 10*3/uL (ref 0.7–4.0)
MCH: 31.6 pg (ref 26.0–34.0)
MCHC: 31.7 g/dL (ref 30.0–36.0)
MCV: 99.7 fL (ref 80.0–100.0)
Monocytes Absolute: 1.3 10*3/uL — ABNORMAL HIGH (ref 0.1–1.0)
Monocytes Relative: 12 %
Neutro Abs: 7.5 10*3/uL (ref 1.7–7.7)
Neutrophils Relative %: 71 %
Platelets: 147 10*3/uL — ABNORMAL LOW (ref 150–400)
RBC: 3.89 MIL/uL — ABNORMAL LOW (ref 4.22–5.81)
RDW: 15.2 % (ref 11.5–15.5)
WBC: 10.5 10*3/uL (ref 4.0–10.5)
nRBC: 0 % (ref 0.0–0.2)

## 2020-08-10 LAB — TROPONIN I (HIGH SENSITIVITY)
Troponin I (High Sensitivity): 12 ng/L (ref <18)
Troponin I (High Sensitivity): 12 ng/L (ref <18)

## 2020-08-10 LAB — PROTIME-INR
INR: 1.1 (ref 0.8–1.2)
Prothrombin Time: 14.2 seconds (ref 11.4–15.2)

## 2020-08-10 LAB — RESP PANEL BY RT-PCR (FLU A&B, COVID) ARPGX2
Influenza A by PCR: NEGATIVE
Influenza B by PCR: NEGATIVE
SARS Coronavirus 2 by RT PCR: NEGATIVE

## 2020-08-10 LAB — VALPROIC ACID LEVEL: Valproic Acid Lvl: 60 ug/mL (ref 50.0–100.0)

## 2020-08-10 LAB — MAGNESIUM: Magnesium: 2.3 mg/dL (ref 1.7–2.4)

## 2020-08-10 MED ORDER — MAGNESIUM HYDROXIDE 400 MG/5ML PO SUSP
30.0000 mL | Freq: Every day | ORAL | Status: DC | PRN
  Administered 2020-08-15: 30 mL via ORAL
  Filled 2020-08-10 (×2): qty 30

## 2020-08-10 MED ORDER — ONDANSETRON HCL 4 MG PO TABS: 4.0000 mg | ORAL_TABLET | Freq: Four times a day (QID) | ORAL | Status: DC | PRN

## 2020-08-10 MED ORDER — SODIUM CHLORIDE 0.9 % IV SOLN: INTRAVENOUS | Status: DC

## 2020-08-10 MED ORDER — DIVALPROEX SODIUM 500 MG PO DR TAB
500.0000 mg | DELAYED_RELEASE_TABLET | Freq: Three times a day (TID) | ORAL | Status: DC
  Administered 2020-08-10 – 2020-08-17 (×22): 500 mg via ORAL
  Filled 2020-08-10 (×25): qty 1

## 2020-08-10 MED ORDER — LIP MEDEX EX OINT
TOPICAL_OINTMENT | CUTANEOUS | Status: DC | PRN
  Filled 2020-08-10: qty 7

## 2020-08-10 MED ORDER — ACETAMINOPHEN 650 MG RE SUPP: 650.0000 mg | Freq: Four times a day (QID) | RECTAL | Status: DC | PRN

## 2020-08-10 MED ORDER — ONDANSETRON HCL 4 MG/2ML IJ SOLN: 4.0000 mg | Freq: Four times a day (QID) | INTRAMUSCULAR | Status: DC | PRN

## 2020-08-10 MED ORDER — TRAZODONE HCL 50 MG PO TABS
25.0000 mg | ORAL_TABLET | Freq: Every evening | ORAL | Status: DC | PRN
  Administered 2020-08-10 – 2020-08-14 (×3): 25 mg via ORAL
  Filled 2020-08-10 (×3): qty 1

## 2020-08-10 MED ORDER — BLISTEX MEDICATED EX OINT
TOPICAL_OINTMENT | CUTANEOUS | Status: DC | PRN
  Administered 2020-08-10: 1 via TOPICAL
  Filled 2020-08-10: qty 6.3

## 2020-08-10 MED ORDER — ASPIRIN EC 81 MG PO TBEC
81.0000 mg | DELAYED_RELEASE_TABLET | Freq: Every day | ORAL | Status: DC
  Administered 2020-08-10 – 2020-08-17 (×8): 81 mg via ORAL
  Filled 2020-08-10 (×8): qty 1

## 2020-08-10 MED ORDER — ATORVASTATIN CALCIUM 20 MG PO TABS
40.0000 mg | ORAL_TABLET | Freq: Every day | ORAL | Status: DC
  Administered 2020-08-10 – 2020-08-16 (×7): 40 mg via ORAL
  Filled 2020-08-10 (×7): qty 2

## 2020-08-10 MED ORDER — ENOXAPARIN SODIUM 40 MG/0.4ML ~~LOC~~ SOLN
40.0000 mg | SUBCUTANEOUS | Status: DC
  Administered 2020-08-10 – 2020-08-17 (×8): 40 mg via SUBCUTANEOUS
  Filled 2020-08-10 (×6): qty 0.4

## 2020-08-10 MED ORDER — CARBIDOPA-LEVODOPA 25-100 MG PO TABS
2.0000 | ORAL_TABLET | Freq: Three times a day (TID) | ORAL | Status: DC
  Administered 2020-08-10 – 2020-08-17 (×22): 2 via ORAL
  Filled 2020-08-10 (×22): qty 2

## 2020-08-10 MED ORDER — SODIUM CHLORIDE 0.9 % IV BOLUS (SEPSIS)
500.0000 mL | Freq: Once | INTRAVENOUS | Status: AC
  Administered 2020-08-10: 500 mL via INTRAVENOUS

## 2020-08-10 MED ORDER — ACETAMINOPHEN 325 MG PO TABS
650.0000 mg | ORAL_TABLET | Freq: Four times a day (QID) | ORAL | Status: DC | PRN
  Administered 2020-08-10: 650 mg via ORAL
  Filled 2020-08-10: qty 2

## 2020-08-10 MED ORDER — TRANEXAMIC ACID FOR EPISTAXIS
500.0000 mg | Freq: Once | TOPICAL | Status: AC
  Administered 2020-08-10: 500 mg via TOPICAL
  Filled 2020-08-10: qty 10

## 2020-08-10 NOTE — TOC Initial Note (Signed)
Transition of Care [facility]) - Initial/Assessment Note    Patient Details  Name: Peter Moore MRN: [id number] Date of Birth: Feb 14, 1941  Transition [facility] Care [facility]) CM/SW Contact:    Shelbie Hutching, RN Phone Number: 08/10/2020, 10:30 AM  Clinical Narrative:                 Patient placed under observation for orthostatic hypotension.  RNCM met with patient at the bedside this morning but patient is confused and unable to answer assessment questions.  RNCM reached out to patient's wife, Peter Moore, via phone.  Patient lives at home with wife and requires assistance with ADL's which she provides.  Currently they have no home health or aid services but wife reports that she is looking into getting some help at home.  Patient has been open with [facility] in the past.  Wife is agreeable to home health or SNF whichever is recommended by PT.  PT order has placed.   Patient walks with a walker at home, wife provides transportation if needed.  Patient does not wear O2 at home but is on acute O2 at 2L.   TOC will cont to follow.   Expected Discharge Plan: [facility] vs SNF) Barriers to Discharge: Continued Medical Work up   Patient Goals and CMS Choice Patient states their goals for this hospitalization and ongoing recovery are:: patient unable to voice goals CMS Medicare.gov Compare Post Acute Care list provided to:: Patient Represenative (must comment) Choice offered to / list presented to : Spouse  Expected Discharge Plan and Services Expected Discharge Plan: [facility] vs SNF)   Discharge Planning Services: CM Consult Post Acute Care Choice: [place] Living arrangements for the past 2 months: Single Family Home                 DME Arranged: N/A DME Agency: NA                  Prior Living Arrangements/Services Living arrangements for the past 2 months: Single Family Home Lives with:: Spouse Patient language and  need for interpreter reviewed:: Yes Do you feel safe going back to the place where you live?: Yes      Need for Family Participation in Patient Care: Yes (Comment) (weakness, orthostatic hypotension, Parkinson's) Care giver support system in place?: Yes (comment) (wife) Current home services: DME (walker, shower chair) Criminal Activity/Legal Involvement Pertinent to Current Situation/Hospitalization: No - Comment as needed  Activities of Daily Living      Permission Sought/Granted Permission sought to share information with : Case Manager,Facility Contact Representative,Family Supports Permission granted to share information with : Yes, Verbal Permission Granted  Share Information with NAME: Peter Moore  Permission granted to share info w AGENCY: HH or SNF's depending on what is recommended by therapy  Permission granted to share info w Relationship: wife     Emotional Assessment Appearance:: Appears stated age Attitude/Demeanor/Rapport: Inconsistent Affect (typically observed): Calm,Appropriate Orientation: : Oriented to Self Alcohol / Substance Use: Not Applicable Psych Involvement: No (comment)  Admission diagnosis:  Orthostatic hypotension [I95.1] Bleeding gums [K06.8] Generalized weakness [R53.1] Injury of head, initial encounter [S09.90XA] Fall, initial encounter [W19.XXXA] Patient Active Problem List   Diagnosis Date Noted  . Orthostatic hypotension 08/10/2020  . Seizure ([place]) 03/01/2020  . Coronary artery disease   . Diabetes mellitus without complication ([place])   . Hypertension   . CVA (cerebral vascular accident) ([place])   .  Depression    PCP:  Langley [facility]acy:   [id number] - [address], [place] AT [facility] [address] [place] [address] Phone: [phone] Fax: [phone]     Social Determinants of Health (SDOH) Interventions    Readmission Risk Interventions No flowsheet data  found.

## 2020-08-10 NOTE — NC FL2 (Signed)
[place] MEDICAID FL2 LEVEL OF CARE SCREENING TOOL     IDENTIFICATION  Patient Name: Peter Moore Birthdate: September 05, 1940 Sex: male Admission Date (Current Location): 08/09/2020  [place] and [place] Number:  Chiropodist and Address:  [facility], [address] [place], [place] [address]      Provider Number: [id number]  Attending Physician Name and Address:  Meredeth Ide, MD  Relative Name and Phone Number:  Shray Hunley (wife) [phone]    Current Level of Care: Hospital Recommended Level of Care: Skilled Nursing Facility Prior Approval Number:    Date Approved/Denied:   PASRR Number: pending  Discharge Plan: SNF    Current Diagnoses: Patient Active Problem List   Diagnosis Date Noted  . Orthostatic hypotension 08/10/2020  . Seizure ([facility]) 03/01/2020  . Coronary artery disease   . Diabetes mellitus without complication ([facility])   . Hypertension   . CVA (cerebral vascular accident) ([facility])   . Depression     Orientation RESPIRATION BLADDER Height & Weight        Normal,O2 ([place]oster 2L) External catheter Weight: 73.4 kg Height:  5\' 9"  (175.3 cm)  BEHAVIORAL SYMPTOMS/MOOD NEUROLOGICAL BOWEL NUTRITION STATUS      Continent Diet  AMBULATORY STATUS COMMUNICATION OF NEEDS Skin   Extensive Assist Verbally Normal                       Personal Care Assistance Level of Assistance  Bathing,Feeding,Dressing Bathing Assistance: Maximum assistance Feeding assistance: Limited assistance Dressing Assistance: Maximum assistance     Functional Limitations Info             SPECIAL CARE FACTORS FREQUENCY  PT (By licensed PT),OT (By licensed OT)     PT Frequency: 5 times per week OT Frequency: 5 times per week            Contractures Contractures Info: Not present    Additional Factors Info  Code Status,Allergies Code Status Info: Full Allergies Info: Clobetasol           Current Medications (08/10/2020):  This is the  current hospital active medication list Current Facility-Administered Medications  Medication Dose Route Frequency Provider Last Rate Last Admin  . 0.9 %  sodium chloride infusion   Intravenous Continuous Mansy, Jan A, MD 100 mL/hr at 08/10/20 0600 Infusion Verify at 08/10/20 0600  . acetaminophen (TYLENOL) tablet 650 mg  650 mg Oral Q6H PRN Mansy, Jan A, MD       Or  . acetaminophen (TYLENOL) suppository 650 mg  650 mg Rectal Q6H PRN Mansy, Jan A, MD      . aspirin EC tablet 81 mg  81 mg Oral Daily Mansy, Jan A, MD   81 mg at 08/10/20 1004  . atorvastatin (LIPITOR) tablet 40 mg  40 mg Oral QHS Mansy, Jan A, MD      . carbidopa-levodopa (SINEMET IR) 25-100 MG per tablet immediate release 2 tablet  2 tablet Oral TID Mansy, 08/12/20, MD   2 tablet at 08/10/20 1004  . divalproex (DEPAKOTE) DR tablet 500 mg  500 mg Oral TID Mansy, Jan A, MD   500 mg at 08/10/20 1004  . enoxaparin (LOVENOX) injection 40 mg  40 mg Subcutaneous Q24H Mansy, Jan A, MD   40 mg at 08/10/20 1006  . magnesium hydroxide (MILK OF MAGNESIA) suspension 30 mL  30 mL Oral Daily PRN Mansy, 08/12/20, MD      . ondansetron [facility])  tablet 4 mg  4 mg Oral Q6H PRN Mansy, Jan A, MD       Or  . ondansetron [facility] injection 4 mg  4 mg Intravenous Q6H PRN Mansy, Jan A, MD      . traZODone (DESYREL) tablet 25 mg  25 mg Oral QHS PRN Mansy, Vernetta Honey, MD         Discharge Medications: Please see discharge summary for a list of discharge medications.  Relevant Imaging Results:  Relevant Lab Results:   Additional Information SS# [id number]  Allayne Butcher, RN

## 2020-08-10 NOTE — Care Management Obs Status (Signed)
MEDICARE OBSERVATION STATUS NOTIFICATION   Patient Details  Name: Peter Moore MRN: [id number] Date of Birth: January 19, 1941   Medicare Observation Status Notification Given:  Yes    Allayne Butcher, RN 08/10/2020, 10:26 AM

## 2020-08-10 NOTE — ED Notes (Signed)
Spouse now at bedside

## 2020-08-10 NOTE — Evaluation (Signed)
Occupational Therapy Evaluation Patient Details Name: Peter Moore MRN: [id number] DOB: May 08, 1941 Today's Date: 08/10/2020    History of Present Illness Patient is a 80 yo male that presented to ED  for acute onset of worsening generalized weakness, as well as fall, work up showed pt was orthostatic, R parieto-occipital scalp hematoma. PMH of CVA with R sided hemiparesis, DM, HTN, CAD, Parkinsons, seizures.   Clinical Impression   Pt seen for OT evaluation this date. Upon arrival to room, pt awake in bed with wife and son present. Pt agreeable to OT eval/tx. Of note, pt unable to state his name (unclear if due to expressive aphasia or AMS), however able to follow 100% of 1-step commands and answer 100% of y/n questions. PLOF obtained from pt and family. Prior to admission, pt was able to perform ADLs with MIN-MOD A and used a rollator for functional mobility. Family reports that pt has required increased assistance over the past couple months, with family recently ordering a WC for pt to use for functional mobility. Pt currently presents with decreased balance, strength, and activity tolerance, and required MIN guard for supine>sit transfer (with HOB elevated, use of bedrails, and increased time/effort), MIN GUARD for seated grooming tasks at EOB, and MOD A for sit>supine transfers. Of note, pt with poor dynamic sitting balance at EOB, requiring intermittent CGA during seated grooming tasks. Pt denied feeling light-headed while sitting EOB, however respiratory rate in 30s. Telemetry reading [facility] during sit>supine transfer, however quickly resolving with cessation of activity; RN informed. Pt left in bed, with family present, and in no acute distress. Pt would benefit from additional skilled OT services to maximize return to PLOF and minimize risk of future falls, injury, caregiver burden, and readmission. Upon discharge, recommend SNF.     Follow Up Recommendations  SNF    Equipment  Recommendations  Other (comment) (defer to next venue of care)       Precautions / Restrictions Precautions Precautions: Fall Restrictions Weight Bearing Restrictions: No      Mobility Bed Mobility Overal bed mobility: Needs Assistance Bed Mobility: Supine to Sit;Sit to Supine     Supine to sit: Min guard;HOB elevated Sit to supine: Mod assist   General bed mobility comments: MAX A to reposition in bed d/t pt fatigue    Transfers                      Balance Overall balance assessment: Needs assistance Sitting-balance support: No upper extremity supported;Feet supported Sitting balance-Leahy Scale: Fair Sitting balance - Comments: Difficulty maintaining sitting balance (R and posterior lean noted), requiring intermittent CGA and cues for body positoning. Pt denied feeling light headed/dizzy                                   ADL either performed or assessed with clinical judgement   ADL Overall ADL's : Needs assistance/impaired     Grooming: Oral care;Supervision/safety;Set up;Sitting                                       Vision Baseline Vision/History: Wears glasses Wears Glasses: At all times Patient Visual Report: No change from baseline Vision Assessment?: No apparent visual deficits            Pertinent Vitals/Pain Pain Assessment: No/denies pain  Extremity/Trunk Assessment Upper Extremity Assessment Upper Extremity Assessment: Generalized weakness (Grossly >3/5. Strength L>R)   Lower Extremity Assessment Lower Extremity Assessment: Defer to PT evaluation       Communication Communication Communication: Expressive difficulties   Cognition Arousal/Alertness: Awake/alert Behavior During Therapy: Flat affect Overall Cognitive Status: Difficult to assess                                 General Comments: Pt unable to state his name (unclear if due to expressive aphasia or AMS). Able to  follow 100% of 1-step commands and answer 100% of y/n questions              Home Living Family/patient expects to be discharged to:: Private residence Living Arrangements: Spouse/significant other Available Help at Discharge: Family;Available PRN/intermittently;Available 24 hours/day (Wife available 24/7 for light physical assist. Son lives nearby and is available PRN) Type of Home: House Home Access: Stairs to enter [employer] of Steps: 5 steps at front door, 4 steps at side door Entrance Stairs-Rails: Left;Can reach both (Railing on L side at front door and railing on both sides at side door) Home Layout: One level     Bathroom Shower/Tub: Engineer, production Accessibility: No   Home Equipment: Wheelchair - [employer] - 4 wheels;Tub bench;Hand held shower head          Prior Functioning/Environment    Gait / Transfers Assistance Needed: Pt uses a rollator at baseline. Family recently ordered a WC d/t pt's increased weakness over past couple months ADL's / Homemaking Assistance Needed: Requires MOD A for dressing and MIN A for seated bathing. Pt was engaging light household maintanence until a couple months ago            OT Problem List: Decreased strength;Decreased activity tolerance;Impaired balance (sitting and/or standing)      OT Treatment/Interventions: Self-care/ADL training;Therapeutic exercise;Energy conservation;DME and/or AE instruction;Therapeutic activities;Patient/family education;Balance training    OT Goals(Current goals can be found in the care plan section) Acute Rehab OT Goals Patient Stated Goal: to improve strength OT Goal Formulation: With patient/family Time For Goal Achievement: 08/24/20 Potential to Achieve Goals: Good ADL Goals Pt Will Perform Grooming: with modified independence;sitting Pt Will Perform Lower Body Dressing: with mod assist;sit to/from stand Pt Will Transfer to Toilet: with min assist;stand pivot  transfer;bedside commode  OT Frequency: Min 2X/week    AM-PAC OT "6 Clicks" Daily Activity     Outcome Measure Help from another person eating meals?: None Help from another person taking care of personal grooming?: A Little Help from another person toileting, which includes using toliet, bedpan, or urinal?: A Lot Help from another person bathing (including washing, rinsing, drying)?: A Lot Help from another person to put on and taking off regular upper body clothing?: A Lot Help from another person to put on and taking off regular lower body clothing?: A Lot 6 Click Score: 15   End of Session Equipment Utilized During Treatment: Oxygen Nurse Communication: Mobility status  Activity Tolerance: Patient tolerated treatment well Patient left: in bed;with call bell/phone within reach;with bed alarm set  OT Visit Diagnosis: Muscle weakness (generalized) (M62.81);History of falling (Z91.81)                Time: 8921-1941 OT Time Calculation (min): 40 min Charges:  OT General Charges $OT Visit: 1 Visit OT Evaluation $OT Eval Moderate Complexity: 1 Mod OT Treatments $Therapeutic  Activity: 8-22 mins  Fredirick Maudlin, OTR/L [place]

## 2020-08-10 NOTE — Evaluation (Signed)
Physical Therapy Evaluation Patient Details Name: Peter Moore MRN: [id number] DOB: Aug 14, 1940 Today's Date: 08/10/2020   History of Present Illness  Patient is a 80 yo male that presented to ED  for acute onset of worsening generalized weakness, as well as fall, work up showed pt was orthostatic, R parieto-occipital scalp hematoma. PMH of CVA with R sided hemiparesis, DM, HTN, CAD, Parkinsons, seizures.    Clinical Impression  Pt lethargic but wakes to PT. Disoriented x4. Unable to recall name; but per chart pt may have expressive aphasia at baseline so his current status is unclear if it is due to AMS or baseline aphasia. PLOF gathered from case management noted and previous hospital admission. Lives with his wife who assists with ADLs as needed and pt was ambulatory with walker.  The patient performed supine <> sit with modA. Extended time needed. Able to follow commands with tactile/visual cues and extended time. Pt did endorse feeling light headed/dizzy when asked, and posterior/R lean noted throughout. CGA-Mina to maintain seated balance. BP assessed  And due to elevated BP, pt fatigue and complaints of symptoms further mobility deferred.  Overall the patient demonstrated deficits (see "PT Problem List") that impede the patient's functional abilities, safety, and mobility and would benefit from skilled PT intervention. Recommendation is SNF due to acute decline in functional status and current level of assistance needed.  Orthostatic vitals assessed: BP in supine: 136/77 (70), sitting 144/109 (89), and sitting checked again 157/121 (129). Returned to supine and BP 138/60 (82). RN notified.     Follow Up Recommendations SNF    Equipment Recommendations  Other (comment) (TBD at next venue of care)    Recommendations for Other Services       Precautions / Restrictions Precautions Precautions: Fall Restrictions Weight Bearing Restrictions: No      Mobility  Bed Mobility Overal  bed mobility: Needs Assistance Bed Mobility: Supine to Sit;Sit to Supine     Supine to sit: Mod assist;HOB elevated Sit to supine: Mod assist   General bed mobility comments: totalAx2 to reposition in bed due to pt fatigue/lethargy    Transfers                 General transfer comment: pt unable; difficulty maintaining sitting balance (R and posterior lean noteD) as well as fatigue, pt stated yes when asked if he felt light headed/dizzy  Ambulation/Gait                Stairs            Wheelchair Mobility    Modified Rankin (Stroke Patients Only)       Balance Overall balance assessment: Needs assistance Sitting-balance support: Feet supported Sitting balance-Leahy Scale: Good                                       Pertinent Vitals/Pain Pain Assessment: Faces Faces Pain Scale: No hurt    Home Living Family/patient expects to be discharged to:: Private residence Living Arrangements: Spouse/significant other Available Help at Discharge: Family;Available PRN/intermittently Type of Home: House Home Access: Stairs to enter   [employer] of Steps: 4-5 Home Layout: One level Home Equipment: Walker - 2 wheels      Prior Function Level of Independence: Needs assistance   Gait / Transfers Assistance Needed: ambulates with RW at baseline per wife  ADL's / Homemaking Assistance Needed: needs assistance with ADLs at  baseline per wife  Comments: PLOF gathered from previous hospital admission as well as case management note     Hand Dominance        Extremity/Trunk Assessment   Upper Extremity Assessment Upper Extremity Assessment: Difficult to assess due to impaired cognition    Lower Extremity Assessment Lower Extremity Assessment: Difficult to assess due to impaired cognition (Pt was able to DF/PF and perform knee flexion with tactile cues, and initiate movement against gravity)       Communication       Cognition Arousal/Alertness: Awake/alert Behavior During Therapy: Flat affect Overall Cognitive Status: No family/caregiver present to determine baseline cognitive functioning                                 General Comments: pt disoriented, unable to state his name. unclear if this is due to some previous expressive aphasia per chart or AMS      General Comments      Exercises Other Exercises Other Exercises: Orthostatic vitals assessed: BP in supine: 136/77 (70), sitting 144/109 (89), and sitting checked again 157/121 (129). Returned to supine and BP 138/60 (82).   Assessment/Plan    PT Assessment Patient needs continued PT services  PT Problem List Decreased strength;Decreased activity tolerance;Decreased balance;Decreased mobility;Decreased knowledge of precautions;Decreased knowledge of use of DME       PT Treatment Interventions DME instruction;Balance training;Gait training;Neuromuscular re-education;Stair training;Functional mobility training;Patient/family education;Therapeutic activities;Therapeutic exercise    PT Goals (Current goals can be found in the Care Plan section)  Acute Rehab PT Goals PT Goal Formulation: Patient unable to participate in goal setting Time For Goal Achievement: 08/24/20 Potential to Achieve Goals: Fair    Frequency Min 2X/week   Barriers to discharge        Co-evaluation               AM-PAC PT "6 Clicks" Mobility  Outcome Measure Help needed turning from your back to your side while in a flat bed without using bedrails?: A Lot Help needed moving from lying on your back to sitting on the side of a flat bed without using bedrails?: A Lot Help needed moving to and from a bed to a chair (including a wheelchair)?: Total Help needed standing up from a chair using your arms (e.g., wheelchair or bedside chair)?: Total Help needed to walk in hospital room?: Total Help needed climbing 3-5 steps with a railing? : Total 6  Click Score: 8    End of Session Equipment Utilized During Treatment: Oxygen Activity Tolerance: Patient limited by fatigue;Patient limited by lethargy;Treatment limited secondary to medical complications (Comment);Other (comment) (elevated BP in sitting) Patient left: in bed;with call bell/phone within reach;with bed alarm set Nurse Communication: Mobility status PT Visit Diagnosis: Difficulty in walking, not elsewhere classified (R26.2);Other abnormalities of gait and mobility (R26.89);Muscle weakness (generalized) (M62.81)    Time: 3785-8850 PT Time Calculation (min) (ACUTE ONLY): 20 min   Charges:   PT Evaluation $PT Eval Low Complexity: 1 Low PT Treatments $Therapeutic Activity: 8-22 mins       Olga Coaster PT, DPT 12:02 PM,08/10/20

## 2020-08-10 NOTE — TOC Progression Note (Signed)
Transition of Care [facility]) - Progression Note    Patient Details  Name: Peter Moore MRN: [id number] Date of Birth: [birth year]  [facility]) CM/SW Contact  Allayne Butcher, RN Phone Number: 08/10/2020, 4:11 PM  Clinical Narrative:    Passr [phone].    Expected Discharge Plan: Home w Home Health Services [facility] vs SNF) Barriers to Discharge: Continued Medical Work up  Expected Discharge Plan and Services Expected Discharge Plan: Home w Home Health Services [facility] vs SNF)   Discharge Planning Services: CM Consult Post Acute Care Choice: Home Health,Skilled Nursing Facility Living arrangements for the past 2 months: Single Family Home                 DME Arranged: N/A DME Agency: NA                   Social Determinants of Health (SDOH) Interventions    Readmission Risk Interventions No flowsheet data found.

## 2020-08-10 NOTE — Progress Notes (Signed)
Subjective: Patient admitted this morning, see detailed H&P by Dr. Arville Care 80 year old Caucasian male with a history of CVA with residual right hemiparesis, diabetes mellitus type 2, hypertension, CAD came to ED with complaints of generalized weakness for past few weeks.  Usually he uses walker for ambulation but had a fall yesterday hitting his head.  Patient states he could barely go to the bathroom.  Also had lower molars on each side of the jaw extracted yesterday in [place].  He has been off his Plavix since the beginning of the month.  He was diagnosed with Parkinson disease on 07/29/2019 and started on Sinemet. Noncontrast head CT showed moderate right parieto-occipital scalp hematoma with no acute intracranial abnormalities.  It also showed chronic left MCA territory infarct with extensive encephalomalacia that is stable since prior examination on 03/01/2020.  Vitals:   08/10/20 0737 08/10/20 1546  BP: 129/70 139/72  Pulse: 66 (!) 108  Resp: 20   Temp: 98.1 F (36.7 C) 98.8 F (37.1 C)  SpO2: 100% 98%      A/P  Orthostatic hypotension-likely from hypovolemia, dehydration.  Started on IV normal saline.  Continue checking orthostatic vital signs every shift.  Will obtain PT consult.  Acute kidney injury-likely prerenal azotemia from dehydration.  Started on IV fluids.  Follow BMP in am.  Gingival/gum bleeding-after molar extractions.  Patient was given tranexamic acid with adequate hemostasis.  Hypertension-hold Cozaar HCTZ due to acute kidney injury.  Diabetes mellitus type 2-continue sliding scale insulin with NovoLog.  Parkinson disease-continue Sinemet  Seizure disorder-continue Keppra, Depakote.    Meredeth Ide [facility] [phone]

## 2020-08-10 NOTE — H&P (Addendum)
[place]   PATIENT NAME: Peter Moore    MR#:  [id number]  DATE OF BIRTH:  25-Sep-1940  DATE OF ADMISSION:  08/09/2020  PRIMARY CARE PHYSICIAN: [place], [facility]   Patient is coming from: Home.  REQUESTING/REFERRING PHYSICIAN: Ward, Peter MawKristen N, DO  CHIEF COMPLAINT:   Chief Complaint  Patient presents with  . Weakness  . Dental Problem    HISTORY OF PRESENT ILLNESS:  Peter BarterKenneth Luckett is a 80 y.o. Caucasian male with medical history significant for CVA with residual right-sided hemiparesis, type 2 diabetes mellitus, hypertension and coronary artery disease, who presented to the ER with acute onset of worsening generalized weakness over the last couple of weeks.  The patient usually uses a walker for ambulation and yesterday had a fall and hit his head while using his walker.  No presyncope or syncope.  No chest pain or palpitations.  No paresthesias or focal muscle weakness beyond his baseline.  He could barely go to the bathroom.  He had lower molars on each side of the jaw that were extracted yesterday at 9 AM in [place].  He has been off of his Plavix since the beginning of this month.  He was diagnosed with Parkinson's disease on 07/29/2019 and was started on Sinemet.  He continued to have bruising from his gums.  He admitted to cough without wheezing or dyspnea.  No nausea or vomiting or abdominal pain. No diarrhea or melena or bright red bleeding per rectum. No chest pain or palpitations.  ED Course: Upon presentation to the ER vital signs were within normal.  He was noted to be orthostatic. EKG as reviewed by me : EKG shows sinus rhythm rate of 82 with poor R wave progression and low voltage QRS with prolonged QT interval (QTC 524 MS) and Q waves inferiorly Imaging: Portable chest ray showed no acute cardiopulmonary disease.  Noncontrasted head CT scan revealed moderate right parieto-occipital scalp hematoma with no acute intracranial abnormalities.  It showed chronic  left MCA territory infarct with extensive encephalomalacia that is stable since prior examination on 03/01/2020.  The patient was given 500 mill IV normal saline bolus followed 525 mL/h.  He was ordered tranexamic acid nebulizer for his bleeding gums.  He will be admitted to an observation medical monitored bed for further evaluation.  Management PAST MEDICAL HISTORY:   Past Medical History:  Diagnosis Date  . Coronary artery disease   . Diabetes mellitus without complication (HCC)   . Hypertension   . Myocardial infarction (HCC)   -Left MCA infarct with right-sided hemiparesis  PAST SURGICAL HISTORY:   Past Surgical History:  Procedure Laterality Date  . CARDIAC SURGERY    . TONSILLECTOMY      SOCIAL HISTORY:   Social History   Tobacco Use  . Smoking status: Current Every Day Smoker    Types: Cigarettes  . Smokeless tobacco: Never Used  Substance Use Topics  . Alcohol use: No    FAMILY HISTORY:  No family history on file.  DRUG ALLERGIES:   Allergies  Allergen Reactions  . Clobetasol Other (See Comments)    Chest pain, heartburn, increase blood sugar    REVIEW OF SYSTEMS:   ROS As per history of present illness. All pertinent systems were reviewed above. Constitutional, HEENT, cardiovascular, respiratory, GI, GU, musculoskeletal, neuro, psychiatric, endocrine, integumentary and hematologic systems were reviewed and are otherwise negative/unremarkable except for positive findings mentioned above in the HPI.   MEDICATIONS AT HOME:  Prior to Admission medications   Medication Sig Start Date End Date Taking? Authorizing Provider  aspirin EC 81 MG tablet Take 81 mg by mouth daily.   Yes [provider]  atorvastatin (LIPITOR) 40 MG tablet Take 40 mg by mouth at bedtime. 02/17/20  Yes [provider]  carbidopa-levodopa (SINEMET IR) 25-100 MG tablet Take 2 tablets by mouth 3 (three) times daily. 07/28/20  Yes [provider]  divalproex  (DEPAKOTE) 500 MG DR tablet Take 500 mg by mouth 3 (three) times daily. 07/05/20  Yes [provider]  acetaminophen (TYLENOL) 325 MG tablet Take 650 mg by mouth every 6 (six) hours as needed for pain or fever. Patient not taking: Reported on 08/10/2020 09/05/18   [provider]  cetirizine (ZYRTEC) 10 MG tablet Take 10 mg by mouth 2 (two) times daily. Patient not taking: Reported on 08/10/2020    [provider]  clopidogrel (PLAVIX) 75 MG tablet Take 1 tablet (75 mg total) by mouth daily. Patient not taking: No sig reported 03/04/20   Arnetha Courser, MD  hydrochlorothiazide (HYDRODIURIL) 25 MG tablet Take 25 mg by mouth daily. Patient not taking: Reported on 08/10/2020 01/28/20   [provider]  losartan (COZAAR) 100 MG tablet Take 100 mg by mouth daily. Patient not taking: Reported on 08/10/2020    [provider]  metFORMIN (GLUCOPHAGE) 500 MG tablet Take 500 mg by mouth 2 (two) times daily with a meal. Patient not taking: Reported on 08/10/2020 09/16/18   [provider]  sertraline (ZOLOFT) 25 MG tablet Take 25 mg by mouth daily. Patient not taking: Reported on 08/10/2020 12/23/19   [provider]  valproic acid (DEPAKENE) 250 MG capsule Take 2 capsules (500 mg total) by mouth 3 (three) times daily. Patient not taking: No sig reported 03/03/20   Arnetha Courser, MD      VITAL SIGNS:  Blood pressure 97/62, pulse (!) 105, temperature 97.6 F (36.4 C), temperature source Oral, resp. rate 18, SpO2 98 %.  PHYSICAL EXAMINATION:  Physical Exam  GENERAL:  80 y.o.-year-old Caucasian male patient lying in the bed with no acute distress.  EYES: Pupils equal, round, reactive to light and accommodation. No scleral icterus. Extraocular muscles intact.  HEENT: Head atraumatic, normocephalic. Oropharynx with currently good hemostasis from bleeding sites of his molars and nasopharynx clear.  NECK:  Supple, no jugular venous distention. No thyroid  enlargement, no tenderness.  LUNGS: Normal breath sounds bilaterally, no wheezing, rales,rhonchi or crepitation. No use of accessory muscles of respiration.  CARDIOVASCULAR: Regular rate and rhythm, S1, S2 normal. No murmurs, rubs, or gallops.  ABDOMEN: Soft, nondistended, nontender. Bowel sounds present. No organomegaly or mass.  EXTREMITIES: No pedal edema, cyanosis, or clubbing.  NEUROLOGIC: He has right-sided hemiparesis and chronic expressive aphasia. PSYCHIATRIC: The patient is alert and cooperative.  Normal affect and good eye contact. SKIN: No obvious rash, lesion, or ulcer.   LABORATORY PANEL:   CBC Recent Labs  Lab 08/09/20 2309  WBC 10.5  HGB 12.3*  HCT 38.8*  PLT 147*   ------------------------------------------------------------------------------------------------------------------  Chemistries  Recent Labs  Lab 08/09/20 2309  NA 137  K 4.5  CL 103  CO2 24  GLUCOSE 167*  BUN 33*  CREATININE 1.47*  CALCIUM 9.0  MG 2.3  AST 22  ALT 12  ALKPHOS 36*  BILITOT 1.2   ------------------------------------------------------------------------------------------------------------------  Cardiac Enzymes No results for input(s): TROPONINI in the last 168 hours. ------------------------------------------------------------------------------------------------------------------  RADIOLOGY:  CT Head Wo Contrast  Result Date: 08/10/2020 CLINICAL DATA:  Altered mental status EXAM: CT HEAD WITHOUT CONTRAST TECHNIQUE: Contiguous axial images were obtained from the base of the skull through the vertex without intravenous contrast. COMPARISON:  03/01/2020 FINDINGS: CT HEAD FINDINGS Brain: Extensive and small full malacia within the left frontal and temporal lobes is again identified as well as the insular cortex related to remote left MCA territory infarct. Remote lacunar infarct noted within the left basal ganglia. Mild diffuse parenchymal volume loss is commensurate with the  patient's age. Mild periventricular white matter changes are present likely reflecting the sequela of small vessel ischemia. No abnormal intra or extra-axial mass lesion or fluid collection. No abnormal mass effect or midline shift. No evidence of acute intracranial hemorrhage or infarct. Ventricular size is normal. Cerebellum is unremarkable. Vascular: Extensive vascular calcifications noted within the carotid siphons. No asymmetric hyperdense vasculature at the skull base. Skull: The calvarium is intact. Sinuses/Orbits: The orbits are unremarkable. The paranasal sinuses are clear. Other: Mastoid air cells and middle ear cavities are clear. Moderate right parieto-occipital scalp hematoma is present IMPRESSION: Moderate right parieto-occipital scalp hematoma. No calvarial fracture. No acute intracranial injury. Chronic left MCA territory infarct. Extensive encephalomalacia, stable since prior examination 03/01/2020. Electronically Signed   By: Helyn Numbers MD   On: 08/10/2020 00:17   CT Cervical Spine Wo Contrast  Result Date: 08/10/2020 CLINICAL DATA:  Altered mental status, generalized weakness, progressive right-sided weakness, neck trauma EXAM: CT HEAD WITHOUT CONTRAST CT CERVICAL SPINE WITHOUT CONTRAST TECHNIQUE: Multidetector CT imaging of the head and cervical spine was performed following the standard protocol without intravenous contrast. Multiplanar CT image reconstructions of the cervical spine were also generated. COMPARISON:  03/01/2020 FINDINGS: CT HEAD FINDINGS Brain: Extensive and small full malacia within the left frontal and temporal lobes is again identified as well as the insular cortex related to remote left MCA territory infarct. Remote lacunar infarct noted within the left basal ganglia. Mild diffuse parenchymal volume loss is commensurate with the patient's age. Mild periventricular white matter changes are present likely reflecting the sequela of small vessel ischemia. No abnormal  intra or extra-axial mass lesion or fluid collection. No abnormal mass effect or midline shift. No evidence of acute intracranial hemorrhage or infarct. Ventricular size is normal. Cerebellum is unremarkable. Vascular: Extensive vascular calcifications noted within the carotid siphons. No asymmetric hyperdense vasculature at the skull base. Skull: The calvarium is intact. Sinuses/Orbits: The orbits are unremarkable. The paranasal sinuses are clear. Other: Mastoid air cells and middle ear cavities are clear. Moderate right parieto-occipital scalp hematoma is present CT CERVICAL SPINE FINDINGS Alignment: Normal cervical lordosis.  No listhesis peer Skull base and vertebrae: The craniocervical junction is unremarkable. The atlantodental interval is normal. No acute fracture of the cervical spine. No lytic or blastic bone lesion. Soft tissues and spinal canal: No prevertebral fluid or swelling. No visible canal hematoma. Disc levels: There is mild intervertebral disc space narrowing and endplate remodeling at C5-6 and C6-7 in keeping with changes of mild degenerative disc disease. Remaining intervertebral disc heights are preserved. Vertebral body heights are preserved. The spinal canal is widely patent. The prevertebral soft tissues are not thickened on sagittal reformats. Multilevel facet arthrosis results in mild multilevel neuroforaminal narrowing, most severe on the left at C3-4 and on the right at C4-5. Upper chest: Visualized lung apices are clear bilaterally. Other: None IMPRESSION: Moderate right parieto-occipital scalp hematoma. No calvarial fracture. No acute intracranial injury. Chronic left MCA territory infarct with extensive encephalomalacia as  described above, stable since prior examination. No acute fracture or listhesis of the cervical spine. Electronically Signed   By: Helyn Numbers MD   On: 08/10/2020 00:14   DG Chest Portable 1 View  Result Date: 08/09/2020 CLINICAL DATA:  Cough, altered mental  status. EXAM: PORTABLE CHEST 1 VIEW COMPARISON:  Chest x-ray 05/15/2017 FINDINGS: Aortic arch calcifications.  Coronary artery stents. The heart size and mediastinal contours are within normal limits. No focal consolidation. No pulmonary edema. No pleural effusion. No pneumothorax. No acute osseous abnormality. IMPRESSION: No active disease. Electronically Signed   By: Tish Frederickson M.D.   On: 08/09/2020 23:37      IMPRESSION AND PLAN:  Active Problems:   Orthostatic hypotension  1.  Orthostatic hypotension, like secondary to hypovolemia and dehydration with subsequent fall and head injury. -The patient will be admitted to an observation medical monitored bed. -We will continue hydration with IV normal saline. -We will check his orthostatics every shift. -Physical therapy consult will be obtained.  2.  Acute kidney injury, likely prerenal due to dehydration. -The patient will be hydrated as mentioned above and will follow his BMP.  3.  Gingival/gum bleeding after molar extractions. -The patient was given nebulized tranexamic acid with adequate hemostasis. -We will hold off his aspirin..  4.  Essential hypertension. -We will hold off Cozaar and HCTZ given acute kidney injury. -He will be placed on as needed IV labetalol.  5.  Type 2 diabetes mellitus without complications. -We will place the patient on supplement coverage with NovoLog and hold off Metformin.  6.  Parkinson's disease. -We will continue Sinemet  7 -Seizure disorder. -We will continue Keppra and Depakote.  DVT prophylaxis: Lovenox. Code Status: full code. Family Communication:  The plan of care was discussed in details with the patient (and family). I answered all questions. The patient agreed to proceed with the above mentioned plan. Further management will depend upon hospital course. Disposition Plan: Back to previous home environment Consults called: none. All the records are reviewed and case discussed  with ED provider.  Status is: Observation  The patient remains OBS appropriate and will d/c before 2 midnights.  Dispo: The patient is from: Home              Anticipated d/c is to: Home              Patient currently is not medically stable to d/c.   Difficult to place patient No   TOTAL TIME TAKING CARE OF THIS PATIENT: 55 minutes.    Hannah Beat M.D on 08/10/2020 at 4:40 AM  [facility]   From 7 PM-7 AM, contact night-coverage [email or web address]  CC: Primary care physician; [facility]

## 2020-08-11 DIAGNOSIS — Z7902 Long term (current) use of antithrombotics/antiplatelets: Secondary | ICD-10-CM | POA: Diagnosis not present

## 2020-08-11 DIAGNOSIS — S0003XA Contusion of scalp, initial encounter: Secondary | ICD-10-CM | POA: Diagnosis present

## 2020-08-11 DIAGNOSIS — I252 Old myocardial infarction: Secondary | ICD-10-CM | POA: Diagnosis not present

## 2020-08-11 DIAGNOSIS — E861 Hypovolemia: Secondary | ICD-10-CM | POA: Diagnosis present

## 2020-08-11 DIAGNOSIS — E119 Type 2 diabetes mellitus without complications: Secondary | ICD-10-CM | POA: Diagnosis present

## 2020-08-11 DIAGNOSIS — G40909 Epilepsy, unspecified, not intractable, without status epilepticus: Secondary | ICD-10-CM | POA: Diagnosis present

## 2020-08-11 DIAGNOSIS — F0281 Dementia in other diseases classified elsewhere with behavioral disturbance: Secondary | ICD-10-CM | POA: Diagnosis not present

## 2020-08-11 DIAGNOSIS — G20 Parkinson's disease: Secondary | ICD-10-CM | POA: Diagnosis present

## 2020-08-11 DIAGNOSIS — D649 Anemia, unspecified: Secondary | ICD-10-CM | POA: Diagnosis present

## 2020-08-11 DIAGNOSIS — E860 Dehydration: Secondary | ICD-10-CM | POA: Diagnosis present

## 2020-08-11 DIAGNOSIS — Z20822 Contact with and (suspected) exposure to covid-19: Secondary | ICD-10-CM | POA: Diagnosis present

## 2020-08-11 DIAGNOSIS — R531 Weakness: Secondary | ICD-10-CM | POA: Diagnosis not present

## 2020-08-11 DIAGNOSIS — I69320 Aphasia following cerebral infarction: Secondary | ICD-10-CM | POA: Diagnosis not present

## 2020-08-11 DIAGNOSIS — W1830XA Fall on same level, unspecified, initial encounter: Secondary | ICD-10-CM | POA: Diagnosis present

## 2020-08-11 DIAGNOSIS — I10 Essential (primary) hypertension: Secondary | ICD-10-CM | POA: Diagnosis present

## 2020-08-11 DIAGNOSIS — I69351 Hemiplegia and hemiparesis following cerebral infarction affecting right dominant side: Secondary | ICD-10-CM | POA: Diagnosis not present

## 2020-08-11 DIAGNOSIS — Y92019 Unspecified place in single-family (private) house as the place of occurrence of the external cause: Secondary | ICD-10-CM | POA: Diagnosis not present

## 2020-08-11 DIAGNOSIS — F0280 Dementia in other diseases classified elsewhere without behavioral disturbance: Secondary | ICD-10-CM | POA: Diagnosis present

## 2020-08-11 DIAGNOSIS — F17210 Nicotine dependence, cigarettes, uncomplicated: Secondary | ICD-10-CM | POA: Diagnosis present

## 2020-08-11 DIAGNOSIS — S0990XA Unspecified injury of head, initial encounter: Secondary | ICD-10-CM | POA: Diagnosis not present

## 2020-08-11 DIAGNOSIS — Z79899 Other long term (current) drug therapy: Secondary | ICD-10-CM | POA: Diagnosis not present

## 2020-08-11 DIAGNOSIS — Z7984 Long term (current) use of oral hypoglycemic drugs: Secondary | ICD-10-CM | POA: Diagnosis not present

## 2020-08-11 DIAGNOSIS — Z888 Allergy status to other drugs, medicaments and biological substances status: Secondary | ICD-10-CM | POA: Diagnosis not present

## 2020-08-11 DIAGNOSIS — Z7982 Long term (current) use of aspirin: Secondary | ICD-10-CM | POA: Diagnosis not present

## 2020-08-11 DIAGNOSIS — N179 Acute kidney failure, unspecified: Secondary | ICD-10-CM | POA: Diagnosis present

## 2020-08-11 DIAGNOSIS — I951 Orthostatic hypotension: Secondary | ICD-10-CM | POA: Diagnosis present

## 2020-08-11 DIAGNOSIS — K068 Other specified disorders of gingiva and edentulous alveolar ridge: Secondary | ICD-10-CM | POA: Diagnosis present

## 2020-08-11 DIAGNOSIS — I2510 Atherosclerotic heart disease of native coronary artery without angina pectoris: Secondary | ICD-10-CM | POA: Diagnosis present

## 2020-08-11 LAB — HEMOGLOBIN A1C
Hgb A1c MFr Bld: 5.1 % (ref 4.8–5.6)
Mean Plasma Glucose: 99.67 mg/dL

## 2020-08-11 LAB — CBC
HCT: 27.6 % — ABNORMAL LOW (ref 39.0–52.0)
Hemoglobin: 9 g/dL — ABNORMAL LOW (ref 13.0–17.0)
MCH: 32.1 pg (ref 26.0–34.0)
MCHC: 32.6 g/dL (ref 30.0–36.0)
MCV: 98.6 fL (ref 80.0–100.0)
Platelets: 105 10*3/uL — ABNORMAL LOW (ref 150–400)
RBC: 2.8 MIL/uL — ABNORMAL LOW (ref 4.22–5.81)
RDW: 15.1 % (ref 11.5–15.5)
WBC: 7.5 10*3/uL (ref 4.0–10.5)
nRBC: 0 % (ref 0.0–0.2)

## 2020-08-11 LAB — BASIC METABOLIC PANEL
Anion gap: 5 (ref 5–15)
BUN: 25 mg/dL — ABNORMAL HIGH (ref 8–23)
CO2: 27 mmol/L (ref 22–32)
Calcium: 8.4 mg/dL — ABNORMAL LOW (ref 8.9–10.3)
Chloride: 107 mmol/L (ref 98–111)
Creatinine, Ser: 1.08 mg/dL (ref 0.61–1.24)
GFR, Estimated: >60 mL/min (ref >60)
Glucose, Bld: 83 mg/dL (ref 70–99)
Potassium: 3.7 mmol/L (ref 3.5–5.1)
Sodium: 139 mmol/L (ref 135–145)

## 2020-08-11 LAB — URINE CULTURE: Culture: NO GROWTH

## 2020-08-11 LAB — HEMOGLOBIN AND HEMATOCRIT, BLOOD
HCT: 25.2 % — ABNORMAL LOW (ref 39.0–52.0)
Hemoglobin: 8.1 g/dL — ABNORMAL LOW (ref 13.0–17.0)

## 2020-08-11 LAB — GLUCOSE, CAPILLARY
Glucose-Capillary: 69 mg/dL — ABNORMAL LOW (ref 70–99)
Glucose-Capillary: 74 mg/dL (ref 70–99)
Glucose-Capillary: 80 mg/dL (ref 70–99)

## 2020-08-11 MED ORDER — INSULIN ASPART 100 UNIT/ML ~~LOC~~ SOLN: 0.0000 [IU] | Freq: Three times a day (TID) | SUBCUTANEOUS | Status: DC

## 2020-08-11 NOTE — TOC Progression Note (Signed)
Transition of Care [facility]) - Progression Note    Patient Details  Name: Reino Lybbert MRN: [id number] Date of Birth: 19-Sep-1940  Transition of Care [facility]) CM/SW Contact  Allayne Butcher, RN Phone Number: 08/11/2020, 1:06 PM  Clinical Narrative:    RNCM was able to follow up with wife, Bonita Quin, today to review bed offer for [facility].  Bonita Quin says she is good with [facility].  Tammy at [facility] will start insurance authorization with [employer].   Expected Discharge Plan: Skilled Nursing Facility Barriers to Discharge: Insurance Authorization  Expected Discharge Plan and Services Expected Discharge Plan: Skilled Nursing Facility   Discharge Planning Services: CM Consult Post Acute Care Choice: Skilled Nursing Facility Living arrangements for the past 2 months: Single Family Home                 DME Arranged: N/A DME Agency: NA                   Social Determinants of Health (SDOH) Interventions    Readmission Risk Interventions No flowsheet data found.

## 2020-08-11 NOTE — Progress Notes (Signed)
Occupational Therapy Treatment Patient Details Name: Peter Moore MRN: [id number] DOB: 1940/09/23 Today's Date: 08/11/2020    History of present illness Patient is a 80 yo male that presented to ED  for acute onset of worsening generalized weakness, as well as fall, work up showed pt was orthostatic, R parieto-occipital scalp hematoma. PMH of CVA with R sided hemiparesis, DM, HTN, CAD, Parkinsons, seizures.   OT comments  Pt seen for OT treatment on this date. Upon arrival to room, pt resting in bed following session with PT. Pt agreeable to OT tx. Pt required MIN GUARD for supine<>sit transfers with HOB elevated. Pt with improved balance this date and able to sit EOB with good dynamic sitting balance for ~48mins. Pt required SUPERVISION/SET-UP to wash face. While seated at EOB, telemetry reading VTACH (HR 80s-110s), unresolving following cessation of activity (unsure if telemetry obtaining accurate reading); pt returned to supine for bed-level ADLs and RN informed. Following return to supine, pt able to engage in brushing teeth, requiring MIN A for thoroughness with application of toothpaste on brush, MIN A for brushing back of front teeth and MIN verbal cues for avoiding molars following recent molar extractions. Pt is making good progress toward goals and continues to benefit from skilled OT services to maximize return to PLOF and minimize risk of future falls, injury, caregiver burden, and readmission. Will continue to follow POC. Discharge recommendation remains appropriate.    Follow Up Recommendations  SNF    Equipment Recommendations  Other (comment) (defer)       Precautions / Restrictions Precautions Precautions: Fall Restrictions Weight Bearing Restrictions: No       Mobility Bed Mobility Overal bed mobility: Needs Assistance Bed Mobility: Supine to Sit;Sit to Supine     Supine to sit: Min guard;HOB elevated Sit to supine: Min guard        Transfers                  General transfer comment: Deferred d/t elevated HR    Balance Overall balance assessment: Needs assistance Sitting-balance support: No upper extremity supported;Feet supported Sitting balance-Leahy Scale: Good Sitting balance - Comments: Good sitting balance at EOB during seated ADLs. No physical assistance required                                   ADL either performed or assessed with clinical judgement   ADL Overall ADL's : Needs assistance/impaired     Grooming: Wash/dry face;Supervision/safety;Set up;Sitting;Oral care;Minimal assistance;Bed level Grooming Details (indicate cue type and reason): Pt required MIN A for thoroughness with application of toothpaste and brushing back of front teeth                                               Cognition Arousal/Alertness: Awake/alert Behavior During Therapy: Flat affect Overall Cognitive Status: Within Functional Limits for tasks assessed                                                     Pertinent Vitals/ Pain       Pain Assessment: No/denies pain         Frequency  Min 2X/week        Progress Toward Goals  OT Goals(current goals can now be found in the care plan section)  Progress towards OT goals: Progressing toward goals  Acute Rehab OT Goals Patient Stated Goal: to improve strength OT Goal Formulation: With patient/family Time For Goal Achievement: 08/24/20 Potential to Achieve Goals: Good  Plan Discharge plan remains appropriate;Frequency remains appropriate       AM-PAC OT "6 Clicks" Daily Activity     Outcome Measure   Help from another person eating meals?: None Help from another person taking care of personal grooming?: A Little Help from another person toileting, which includes using toliet, bedpan, or urinal?: A Lot Help from another person bathing (including washing, rinsing, drying)?: A Lot Help from another person to put on and  taking off regular upper body clothing?: A Lot Help from another person to put on and taking off regular lower body clothing?: A Lot 6 Click Score: 15    End of Session Equipment Utilized During Treatment: Oxygen  OT Visit Diagnosis: Muscle weakness (generalized) (M62.81);History of falling (Z91.81)   Activity Tolerance Patient tolerated treatment well   Patient Left in bed;with call bell/phone within reach;with bed alarm set   Nurse Communication Mobility status        Time: 5631-4970 OT Time Calculation (min): 28 min  Charges: OT General Charges $OT Visit: 1 Visit OT Treatments $Therapeutic Activity: 23-37 mins  Matthew Folks, OTR/L [facility] [phone]

## 2020-08-11 NOTE — Progress Notes (Signed)
[facility]  PROGRESS NOTE  Peter Moore KDX:[id number] DOB: 08-25-1940 DOA: 08/09/2020 PCP: [facility]   Brief HPI:   80 year old Caucasian male with a history of CVA with residual right hemiparesis, diabetes mellitus type 2, hypertension, CAD came to ED with complaints of generalized weakness for past few weeks.  Usually he uses walker for ambulation but had a fall yesterday hitting his head.  Patient states he could barely go to the bathroom.  Also had lower molars on each side of the jaw extracted yesterday in [place].  He has been off his Plavix since the beginning of the month.  He was diagnosed with Parkinson disease on 07/29/2019 and started on Sinemet. Noncontrast head CT showed moderate right parieto-occipital scalp hematoma with no acute intracranial abnormalities.  It also showed chronic left MCA territory infarct with extensive encephalomalacia that is stable since prior examination on 03/01/2020.    Subjective   Patient seen and examined, denies chest pain or shortness of breath.  Bleeding from the molar extraction has stopped.   Assessment/Plan:     1. Orthostatic hypotension-secondary to hypovolemia and dehydration with subsequent fall in head injury.  Patient started on IV normal saline at 100 mL/h.  Continue to monitor patient's orthostatic vital signs every shift. 2. Acute kidney injury-secondary to dehydration, poor p.o. intake, prerenal azotemia.  Patient presented with creatinine of 1.47, was started on IV fluids.  At this time creatinine is 1.08.  BUN still elevated 25.  Continue IV normal saline.  Follow BMP in am. 3. Gingival/gum bleeding after molar extraction-patient was given nebulized tranexamic acid with adequate hemostasis. 4. Hypertension-HCTZ and Cozaar are currently on hold due to acute kidney injury.  Continue as needed IV labetalol. 5. Diabetes mellitus type 2-Metformin on hold, continue sliding scale insulin with NovoLog.  Check CBG  before every meal and at bedtime. 6. Parkinson disease-continue Sinemet 7. Seizure disorder-continue Keppra, Depakote.   Scheduled medications:   . aspirin EC  81 mg Oral Daily  . atorvastatin  40 mg Oral QHS  . carbidopa-levodopa  2 tablet Oral TID  . divalproex  500 mg Oral TID  . enoxaparin (LOVENOX) injection  40 mg Subcutaneous Q24H         Data Reviewed:   CBG:  Recent Labs  Lab 08/09/20 2359 08/11/20 0718  GLUCAP 128* 69*    SpO2: 100 % O2 Flow Rate (L/min): 2 L/min    Vitals:   08/10/20 2300 08/11/20 0443 08/11/20 0725 08/11/20 1141  BP: 134/68 114/81 (!) 126/59 113/67  Pulse: 70 (!) 57 (!) 59 (!) 106  Resp: 17 15 17 18   Temp: 98.1 F (36.7 C) 98 F (36.7 C) 97.6 F (36.4 C) 97.7 F (36.5 C)  TempSrc: Oral   Oral  SpO2: 98% 100% 100% 100%  Weight:      Height:         Intake/Output Summary (Last 24 hours) at 08/11/2020 1409 Last data filed at 08/11/2020 1033 Gross per 24 hour  Intake 3585.53 ml  Output 1200 ml  Net 2385.53 ml    03/29 1901 - 03/31 0700 In: 3710.1 [P.O.:2780; I.V.:930.1] Out: 2150 [Urine:2150]  Filed Weights   08/10/20 0605  Weight: 73.4 kg    CBC:  Recent Labs  Lab 08/09/20 2309 08/10/20 0654 08/10/20 1219 08/10/20 1834 08/11/20 0033 08/11/20 0638  WBC 10.5  --   --   --   --  7.5  HGB 12.3* 10.8* 9.4* 8.5* 8.1* 9.0*  HCT  38.8* 33.4* 29.4* 26.4* 25.2* 27.6*  PLT 147*  --   --   --   --  105*  MCV 99.7  --   --   --   --  98.6  MCH 31.6  --   --   --   --  32.1  MCHC 31.7  --   --   --   --  32.6  RDW 15.2  --   --   --   --  15.1  LYMPHSABS 1.6  --   --   --   --   --   MONOABS 1.3*  --   --   --   --   --   EOSABS 0.0  --   --   --   --   --   BASOSABS 0.1  --   --   --   --   --     Complete metabolic panel:  Recent Labs  Lab 08/09/20 2309 08/10/20 0654 08/11/20 0638  NA 137 139 139  K 4.5 4.4 3.7  CL 103 107 107  CO2 24 26 27   GLUCOSE 167* 99 83  BUN 33* 37* 25*  CREATININE 1.47* 1.18  1.08  CALCIUM 9.0 8.8* 8.4*  AST 22  --   --   ALT 12  --   --   ALKPHOS 36*  --   --   BILITOT 1.2  --   --   ALBUMIN 3.1*  --   --   MG 2.3  --   --   INR 1.1  --   --     No results for input(s): LIPASE, AMYLASE in the last 168 hours.  Recent Labs  Lab 08/10/20 0424  SARSCOV2NAA NEGATIVE    ------------------------------------------------------------------------------------------------------------------ No results for input(s): CHOL, HDL, LDLCALC, TRIG, CHOLHDL, LDLDIRECT in the last 72 hours.  Lab Results  Component Value Date   HGBA1C 5.9 (H) 03/02/2020   ------------------------------------------------------------------------------------------------------------------ No results for input(s): TSH, T4TOTAL, T3FREE, THYROIDAB in the last 72 hours.  Invalid input(s): FREET3 ------------------------------------------------------------------------------------------------------------------ No results for input(s): VITAMINB12, FOLATE, FERRITIN, TIBC, IRON, RETICCTPCT in the last 72 hours.  Coagulation profile  Recent Labs  Lab 08/09/20 2309  INR 1.1    No results for input(s): DDIMER in the last 72 hours.  Cardiac Enzymes  No results for input(s): CKMB, TROPONINI, MYOGLOBIN in the last 168 hours.  Invalid input(s): CK ------------------------------------------------------------------------------------------------------------------ No results found for: BNP   Antibiotics: Anti-infectives (From admission, onward)   None       Radiology Reports  CT Head Wo Contrast  Result Date: 08/10/2020 CLINICAL DATA:  Altered mental status EXAM: CT HEAD WITHOUT CONTRAST TECHNIQUE: Contiguous axial images were obtained from the base of the skull through the vertex without intravenous contrast. COMPARISON:  03/01/2020 FINDINGS: CT HEAD FINDINGS Brain: Extensive and small full malacia within the left frontal and temporal lobes is again identified as well as the insular cortex  related to remote left MCA territory infarct. Remote lacunar infarct noted within the left basal ganglia. Mild diffuse parenchymal volume loss is commensurate with the patient's age. Mild periventricular white matter changes are present likely reflecting the sequela of small vessel ischemia. No abnormal intra or extra-axial mass lesion or fluid collection. No abnormal mass effect or midline shift. No evidence of acute intracranial hemorrhage or infarct. Ventricular size is normal. Cerebellum is unremarkable. Vascular: Extensive vascular calcifications noted within the carotid siphons. No asymmetric hyperdense vasculature at the skull base.  Skull: The calvarium is intact. Sinuses/Orbits: The orbits are unremarkable. The paranasal sinuses are clear. Other: Mastoid air cells and middle ear cavities are clear. Moderate right parieto-occipital scalp hematoma is present IMPRESSION: Moderate right parieto-occipital scalp hematoma. No calvarial fracture. No acute intracranial injury. Chronic left MCA territory infarct. Extensive encephalomalacia, stable since prior examination 03/01/2020. Electronically Signed   By: Helyn Numbers MD   On: 08/10/2020 00:17   CT Cervical Spine Wo Contrast  Result Date: 08/10/2020 CLINICAL DATA:  Altered mental status, generalized weakness, progressive right-sided weakness, neck trauma EXAM: CT HEAD WITHOUT CONTRAST CT CERVICAL SPINE WITHOUT CONTRAST TECHNIQUE: Multidetector CT imaging of the head and cervical spine was performed following the standard protocol without intravenous contrast. Multiplanar CT image reconstructions of the cervical spine were also generated. COMPARISON:  03/01/2020 FINDINGS: CT HEAD FINDINGS Brain: Extensive and small full malacia within the left frontal and temporal lobes is again identified as well as the insular cortex related to remote left MCA territory infarct. Remote lacunar infarct noted within the left basal ganglia. Mild diffuse parenchymal volume  loss is commensurate with the patient's age. Mild periventricular white matter changes are present likely reflecting the sequela of small vessel ischemia. No abnormal intra or extra-axial mass lesion or fluid collection. No abnormal mass effect or midline shift. No evidence of acute intracranial hemorrhage or infarct. Ventricular size is normal. Cerebellum is unremarkable. Vascular: Extensive vascular calcifications noted within the carotid siphons. No asymmetric hyperdense vasculature at the skull base. Skull: The calvarium is intact. Sinuses/Orbits: The orbits are unremarkable. The paranasal sinuses are clear. Other: Mastoid air cells and middle ear cavities are clear. Moderate right parieto-occipital scalp hematoma is present CT CERVICAL SPINE FINDINGS Alignment: Normal cervical lordosis.  No listhesis peer Skull base and vertebrae: The craniocervical junction is unremarkable. The atlantodental interval is normal. No acute fracture of the cervical spine. No lytic or blastic bone lesion. Soft tissues and spinal canal: No prevertebral fluid or swelling. No visible canal hematoma. Disc levels: There is mild intervertebral disc space narrowing and endplate remodeling at C5-6 and C6-7 in keeping with changes of mild degenerative disc disease. Remaining intervertebral disc heights are preserved. Vertebral body heights are preserved. The spinal canal is widely patent. The prevertebral soft tissues are not thickened on sagittal reformats. Multilevel facet arthrosis results in mild multilevel neuroforaminal narrowing, most severe on the left at C3-4 and on the right at C4-5. Upper chest: Visualized lung apices are clear bilaterally. Other: None IMPRESSION: Moderate right parieto-occipital scalp hematoma. No calvarial fracture. No acute intracranial injury. Chronic left MCA territory infarct with extensive encephalomalacia as described above, stable since prior examination. No acute fracture or listhesis of the cervical  spine. Electronically Signed   By: Helyn Numbers MD   On: 08/10/2020 00:14   DG Chest Portable 1 View  Result Date: 08/09/2020 CLINICAL DATA:  Cough, altered mental status. EXAM: PORTABLE CHEST 1 VIEW COMPARISON:  Chest x-ray 05/15/2017 FINDINGS: Aortic arch calcifications.  Coronary artery stents. The heart size and mediastinal contours are within normal limits. No focal consolidation. No pulmonary edema. No pleural effusion. No pneumothorax. No acute osseous abnormality. IMPRESSION: No active disease. Electronically Signed   By: Tish Frederickson M.D.   On: 08/09/2020 23:37      DVT prophylaxis: Lovenox  Code Status: Full code  Family Communication: No family at bedside   Consultants:    Procedures:      Objective    Physical Examination:    General-appears in no  acute distress  Heart-S1-S2, regular, no murmur auscultated  Lungs-clear to auscultation bilaterally, no wheezing or crackles auscultated  Abdomen-soft, nontender, no organomegaly  Extremities-no edema in the lower extremities  Neuro-alert, oriented x3, no focal deficit noted   Status is: Inpatient  Dispo: The patient is from: Home              Anticipated d/c is to: Skilled nursing facility              Anticipated d/c date is: 08/12/2020              Patient currently not stable for discharge  Barrier to discharge-dehydration, AKI  COVID-19 Labs  No results for input(s): DDIMER, FERRITIN, LDH, CRP in the last 72 hours.  Lab Results  Component Value Date   SARSCOV2NAA NEGATIVE 08/10/2020   SARSCOV2NAA NEGATIVE 03/01/2020    Microbiology  Recent Results (from the past 240 hour(s))  Urine Culture     Status: None   Collection Time: 08/10/20 12:29 AM   Specimen: Urine, Random  Result Value Ref Range Status   Specimen Description   Final    URINE, RANDOM Performed at [facility], [address] [place], [place] [address]    Special Requests   Final    NONE Performed at  [facility], [address] [place], [place] [address]    Culture   Final    NO GROWTH Performed at [facility], [address] [place], [place] [address]    Report Status 08/11/2020 FINAL  Final  Resp Panel by RT-PCR (Flu A&B, Covid) Nasopharyngeal Swab     Status: None   Collection Time: 08/10/20  4:24 AM   Specimen: Nasopharyngeal Swab; Nasopharyngeal(NP) swabs in vial transport medium  Result Value Ref Range Status   SARS Coronavirus 2 by RT PCR NEGATIVE NEGATIVE Final    Comment: (NOTE) SARS-CoV-2 target nucleic acids are NOT DETECTED.  The SARS-CoV-2 RNA is generally detectable in upper respiratory specimens during the acute phase of infection. The lowest concentration of SARS-CoV-2 viral copies this assay can detect is 138 copies/mL. A negative result does not preclude SARS-Cov-2 infection and should not be used as the sole basis for treatment or other patient management decisions. A negative result may occur with  improper specimen collection/handling, submission of specimen other than nasopharyngeal swab, presence of viral mutation(s) within the areas targeted by this assay, and inadequate number of viral copies(<138 copies/mL). A negative result must be combined with clinical observations, patient history, and epidemiological information. The expected result is Negative.  Fact Sheet for Patients:  BloggerCourse.comhttps://www.fda.gov/media/152166/download  Fact Sheet for Healthcare Providers:  SeriousBroker.ithttps://www.fda.gov/media/152162/download  This test is no t yet approved or cleared by the [place] FDA and  has been authorized for detection and/or diagnosis of SARS-CoV-2 by FDA under an Emergency Use Authorization (EUA). This EUA will remain  in effect (meaning this test can be used) for the duration of the COVID-19 declaration under Section 564(b)(1) of the Act, 21 U.S.C.section 360bbb-3(b)(1), unless the authorization is terminated  or revoked sooner.        Influenza A by PCR NEGATIVE NEGATIVE Final   Influenza B by PCR NEGATIVE NEGATIVE Final    Comment: (NOTE) The Xpert Xpress SARS-CoV-2/FLU/RSV plus assay is intended as an aid in the diagnosis of influenza from Nasopharyngeal swab specimens and should not be used as a sole basis for treatment. Nasal washings and aspirates are unacceptable for Xpert Xpress SARS-CoV-2/FLU/RSV testing.  Fact  Sheet for Patients: BloggerCourse.com  Fact Sheet for Healthcare Providers: SeriousBroker.it  This test is not yet approved or cleared by the [place] FDA and has been authorized for detection and/or diagnosis of SARS-CoV-2 by FDA under an Emergency Use Authorization (EUA). This EUA will remain in effect (meaning this test can be used) for the duration of the COVID-19 declaration under Section 564(b)(1) of the Act, 21 U.S.C. section 360bbb-3(b)(1), unless the authorization is terminated or revoked.  Performed at [facility], [address], [place], [place] [address]              Meredeth Ide   [facility] If 7PM-7AM, please contact night-coverage at [email or web address], Office  [phone]   08/11/2020, 2:09 PM  LOS: 0 days

## 2020-08-11 NOTE — Progress Notes (Signed)
Physical Therapy Treatment Patient Details Name: Peter Moore MRN: [id number] DOB: 08/12/1940 Today's Date: 08/11/2020    History of Present Illness Patient is a 80 yo male that presented to ED  for acute onset of worsening generalized weakness, as well as fall, work up showed pt was orthostatic, R parieto-occipital scalp hematoma. PMH of CVA with R sided hemiparesis, DM, HTN, CAD, Parkinsons, seizures.    PT Comments    Pt was long sitting in bed with RN in room and supportive spouse at bedside. He is awake and conversational however disoriented and confused. He was pleasant and able to follow simple commands with increased time. BP in supine 125/76. Upon sitting up EOB 108/58. Sat EOB x several minutes prior to standing 3 x EOB to RW. Pt required min assist from elevated be height. Standing BP 107/85. Poor telemetry reading throughout. RN notified. Several times telemetry reading 0 for HR and then > 230 bpm. Elected not to ambulate during session due to lack of accurate telemetry. Pt did not endorse symptoms of orthostatic hypotension or fatigue. Tolerated alternating marching in place 2 x 20 prior to taking steps to [facility]. PT will continue to follow and progress per POC. Highly recommend SNF at DC to address deficits while decreasing caregiver burden.    Follow Up Recommendations  SNF     Equipment Recommendations  Other (comment) (defer to next level of care)    Recommendations for Other Services       Precautions / Restrictions Precautions Precautions: Fall Restrictions Weight Bearing Restrictions: No    Mobility  Bed Mobility Overal bed mobility: Needs Assistance Bed Mobility: Supine to Sit;Sit to Supine     Supine to sit: HOB elevated;Min assist Sit to supine: Min assist   General bed mobility comments: Increased time to perform with step by step cues for improved sequencing. BP prior to sitting EOB 125/76, sitting EOB 108/58 however pt assymptomatic.     Transfers Overall transfer level: Needs assistance Equipment used: Rolling walker (2 wheeled) Transfers: Sit to/from Stand Sit to Stand: Min assist;From elevated surface         General transfer comment: Pt stood 3 x throughout session EOB. Telemetry not reading accurately. several times reading 0 for HR and at times reading almost 250 bpm. RN aware.  Ambulation/Gait Ambulation/Gait assistance: Min assist Gait Distance (Feet): 3 Feet Assistive device: Rolling walker (2 wheeled) Gait Pattern/deviations: Step-to pattern Gait velocity: decreased   General Gait Details: Pt was able to alternate marching in place 2 x 20 prior to taking steps to [facility]. did not progres sto ambulation due to telemetry concerns      Balance Overall balance assessment: Needs assistance Sitting-balance support: No upper extremity supported;Feet supported Sitting balance-Leahy Scale: Good Sitting balance - Comments: no LOB with feet support         Cognition Arousal/Alertness: Awake/alert Behavior During Therapy: Flat affect Overall Cognitive Status: Within Functional Limits for tasks assessed        General Comments: Pt was A and cooperative however has baseline cognition deficits. Very supportive spouse at bedside and helpful throughout session.             Pertinent Vitals/Pain Pain Assessment: No/denies pain           PT Goals (current goals can now be found in the care plan section) Acute Rehab PT Goals Patient Stated Goal: to improve strength Progress towards PT goals: Progressing toward goals    Frequency    Min 2X/week  PT Plan Current plan remains appropriate    Co-evaluation              AM-PAC PT "6 Clicks" Mobility   Outcome Measure  Help needed turning from your back to your side while in a flat bed without using bedrails?: A Lot Help needed moving from lying on your back to sitting on the side of a flat bed without using bedrails?: A Lot Help needed  moving to and from a bed to a chair (including a wheelchair)?: Total Help needed standing up from a chair using your arms (e.g., wheelchair or bedside chair)?: Total Help needed to walk in hospital room?: Total Help needed climbing 3-5 steps with a railing? : Total 6 Click Score: 8    End of Session Equipment Utilized During Treatment: Oxygen Activity Tolerance: Patient tolerated treatment well;Patient limited by fatigue Patient left: in bed;with call bell/phone within reach;with bed alarm set Nurse Communication: Mobility status PT Visit Diagnosis: Difficulty in walking, not elsewhere classified (R26.2);Other abnormalities of gait and mobility (R26.89);Muscle weakness (generalized) (M62.81)     Time: 2778-2423 PT Time Calculation (min) (ACUTE ONLY): 30 min  Charges:  $Therapeutic Activity: 23-37 mins                     Jetta Lout PTA 08/11/20, 5:08 PM

## 2020-08-12 LAB — BASIC METABOLIC PANEL
Anion gap: 4 — ABNORMAL LOW (ref 5–15)
BUN: 16 mg/dL (ref 8–23)
CO2: 28 mmol/L (ref 22–32)
Calcium: 8.6 mg/dL — ABNORMAL LOW (ref 8.9–10.3)
Chloride: 108 mmol/L (ref 98–111)
Creatinine, Ser: 0.92 mg/dL (ref 0.61–1.24)
GFR, Estimated: >60 mL/min (ref >60)
Glucose, Bld: 77 mg/dL (ref 70–99)
Potassium: 4 mmol/L (ref 3.5–5.1)
Sodium: 140 mmol/L (ref 135–145)

## 2020-08-12 LAB — GLUCOSE, CAPILLARY
Glucose-Capillary: 77 mg/dL (ref 70–99)
Glucose-Capillary: 107 mg/dL — ABNORMAL HIGH (ref 70–99)
Glucose-Capillary: 111 mg/dL — ABNORMAL HIGH (ref 70–99)
Glucose-Capillary: 111 mg/dL — ABNORMAL HIGH (ref 70–99)

## 2020-08-12 NOTE — Progress Notes (Signed)
Physical Therapy Treatment Patient Details Name: Peter Moore MRN: [id number] DOB: [birth year] Today's Date: 08/12/2020    History of Present Illness Patient is a 80 yo male that presented to ED  for acute onset of worsening generalized weakness, as well as fall, work up showed pt was orthostatic, R parieto-occipital scalp hematoma. PMH of CVA with R sided hemiparesis, DM, HTN, CAD, Parkinsons, seizures.    PT Comments    Pt seen for PT treatment with wife present for session. Vitals checked & pt initially orthostatic but with improvements in BP with sitting EOB - PT educated pt's wife on orthostatic hypotension & benefits of consuming fluids if able. Once BP improved sitting EOB, PT attempts to don gait belt in preparation for sit>stand but telemetry monitor began alarming 2/2 v-tach with HR increasing to 125 bpm - nurse immediately called to room & pt assisted back to bed with improvement noted in HR. PT & nurse assist pt with scooting to [facility] & pt left with wife present.  BP checked in LUE: Supine: 124/79 mmHg (MAP 91), HR 64 bpm Sitting EOB at 0: 99/73 mmHg (MAP 78), HR 67 bpm Sitting EOB at 2 min: 126/86 mmHg (MAP 89), HR 65 bpm     Follow Up Recommendations  SNF     Equipment Recommendations   (TBD in next venue)    Recommendations for Other Services       Precautions / Restrictions Precautions Precautions: Fall Restrictions Weight Bearing Restrictions: No    Mobility  Bed Mobility Overal bed mobility: Needs Assistance Bed Mobility: Supine to Sit;Sit to Supine     Supine to sit: Min assist;Mod assist;HOB elevated Sit to supine: Min guard;HOB elevated   General bed mobility comments: assistance to fully move BLE off of EOB & assistance to upright trunk    Transfers                    Ambulation/Gait                 Stairs             Wheelchair Mobility    Modified Rankin (Stroke Patients Only)       Balance Overall balance  assessment: Needs assistance Sitting-balance support: Feet supported;Bilateral upper extremity supported Sitting balance-Leahy Scale: Good Sitting balance - Comments: supervision sitting EOB                                    Cognition Arousal/Alertness: Awake/alert Behavior During Therapy: Flat affect Overall Cognitive Status: Within Functional Limits for tasks assessed                                 General Comments: Pt was A and cooperative however has baseline cognition deficits. Very supportive spouse at bedside and helpful throughout session.      Exercises      General Comments General comments (skin integrity, edema, etc.): Pt on 2L/min via nasal cannula, SpO2 >90% throughout.      Pertinent Vitals/Pain Pain Assessment: Faces Faces Pain Scale: No hurt    Home Living                      Prior Function            PT Goals (current goals can now be found in the  care plan section) Acute Rehab PT Goals Patient Stated Goal: to improve strength PT Goal Formulation: Patient unable to participate in goal setting Time For Goal Achievement: 08/24/20 Potential to Achieve Goals: Fair Progress towards PT goals: PT to reassess next treatment    Frequency    Min 2X/week      PT Plan Current plan remains appropriate    Co-evaluation              AM-PAC PT "6 Clicks" Mobility   Outcome Measure  Help needed turning from your back to your side while in a flat bed without using bedrails?: A Little Help needed moving from lying on your back to sitting on the side of a flat bed without using bedrails?: A Lot Help needed moving to and from a bed to a chair (including a wheelchair)?: Total Help needed standing up from a chair using your arms (e.g., wheelchair or bedside chair)?: Total Help needed to walk in hospital room?: Total Help needed climbing 3-5 steps with a railing? : Total 6 Click Score: 9    End of Session  Equipment Utilized During Treatment: Oxygen Activity Tolerance: Treatment limited secondary to medical complications (Comment) Patient left: in bed;with call bell/phone within reach;with bed alarm set;with family/visitor present Nurse Communication:  (vitals) PT Visit Diagnosis: Difficulty in walking, not elsewhere classified (R26.2);Other abnormalities of gait and mobility (R26.89);Muscle weakness (generalized) (M62.81)     Time: 2694-8546 PT Time Calculation (min) (ACUTE ONLY): 17 min  Charges:  $Therapeutic Activity: 8-22 mins                     Aleda Grana, PT, DPT 08/12/20, 4:20 PM    Sandi Mariscal 08/12/2020, 4:14 PM

## 2020-08-12 NOTE — Progress Notes (Signed)
[facility]  PROGRESS NOTE  Peter Moore ZOX:[id number] DOB: Sep 24, 1940 DOA: 08/09/2020 [place]   Brief HPI:   80 year old Caucasian male with a history of CVA with residual right hemiparesis, diabetes mellitus type 2, hypertension, CAD came to ED with complaints of generalized weakness for past few weeks.  Usually he uses walker for ambulation but had a fall yesterday hitting his head.  Patient states he could barely go to the bathroom.  Also had lower molars on each side of the jaw extracted yesterday in [place].  He has been off his Plavix since the beginning of the month.  He was diagnosed with Parkinson disease on 07/29/2019 and started on Sinemet. Noncontrast head CT showed moderate right parieto-occipital scalp hematoma with no acute intracranial abnormalities.  It also showed chronic left MCA territory infarct with extensive encephalomalacia that is stable since prior examination on 03/01/2020.    Subjective   Patient seen and examined, continues to be pleasantly confused.   Assessment/Plan:     1. Orthostatic hypotension-secondary to hypovolemia and dehydration with subsequent fall in head injury.  Patient started on IV normal saline at 100 mL/h.  Continue to monitor patient's orthostatic vital signs every shift.  2. Mechanical fall/right parieto-occipital scalp hematoma-CT head shows moderate right parieto-occipital scalp hematoma.  No intracranial abnormality noted.  3. Cognitive decline-as per wife patient has had cognitive decline over past 2 years.  He was recently seen by [facility] at that time it was recommended to continue with Depakote, for post stroke seizure.  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  No new symptoms noted in the hospital.  [facility]  4. Acute kidney injury-secondary to dehydration, poor p.o. intake, prerenal azotemia.  Patient  presented with creatinine of 1.47, was started on IV fluids.  BUN/creatinine is 16/0.92.  Follow BMP in am.    5. Gingival/gum bleeding after molar extraction-patient was given nebulized tranexamic acid with adequate hemostasis.  6. Hypertension-HCTZ and Cozaar are currently on hold due to acute kidney injury.  Continue as needed IV labetalol.  7. Diabetes mellitus type 2-Metformin on hold, continue sliding scale insulin with NovoLog.  Check CBG before every meal and at bedtime.  8. Parkinson disease-continue Sinemet.  9. Seizure disorder-continue Depakote.   Scheduled medications:   . aspirin EC  81 mg Oral Daily  . atorvastatin  40 mg Oral QHS  . carbidopa-levodopa  2 tablet Oral TID  . divalproex  500 mg Oral TID  . enoxaparin (LOVENOX) injection  40 mg Subcutaneous Q24H  . insulin aspart  0-9 Units Subcutaneous TID WC     Data Reviewed:   CBG:  Recent Labs  Lab 08/11/20 0718 08/11/20 1643 08/11/20 2124 08/12/20 0748 08/12/20 1215  GLUCAP 69* 74 80 77 107*    SpO2: 100 % O2 Flow Rate (L/min): 2 L/min    Vitals:   08/12/20 0034 08/12/20 0421 08/12/20 0737 08/12/20 1206  BP: 136/73 137/80 (!) 144/122 118/76  Pulse: 64 65 62 61  Resp: 20 14 18 18   Temp: 97.7 F (36.5 C) (!) 97.5 F (36.4 C) 98.2 F (36.8 C) (!) 97.3 F (36.3 C)  TempSrc:  Oral Oral Oral  SpO2: 100% 100% 100% 100%  Weight:      Height:         Intake/Output Summary (Last 24 hours) at 08/12/2020 1513 Last data filed at 08/12/2020 1415 Gross per 24 hour  Intake 1526.7 ml  Output 2225 ml  Net -698.3 ml    03/30 1901 - 04/01 0700 In: 3726.7 [P.O.:2800; I.V.:926.7] Out: 2225 [Urine:2225]  Filed Weights   08/10/20 0605  Weight: 73.4 kg    CBC:  Recent Labs  Lab 08/09/20 2309 08/10/20 0654 08/10/20 1219 08/10/20 1834 08/11/20 0033 08/11/20 0638  WBC 10.5  --   --   --   --  7.5  HGB 12.3* 10.8* 9.4* 8.5* 8.1* 9.0*  HCT 38.8* 33.4* 29.4* 26.4* 25.2* 27.6*  PLT 147*  --   --    --   --  105*  MCV 99.7  --   --   --   --  98.6  MCH 31.6  --   --   --   --  32.1  MCHC 31.7  --   --   --   --  32.6  RDW 15.2  --   --   --   --  15.1  LYMPHSABS 1.6  --   --   --   --   --   MONOABS 1.3*  --   --   --   --   --   EOSABS 0.0  --   --   --   --   --   BASOSABS 0.1  --   --   --   --   --     Complete metabolic panel:  Recent Labs  Lab 08/09/20 2309 08/10/20 0654 08/11/20 0638 08/12/20 0500  NA 137 139 139 140  K 4.5 4.4 3.7 4.0  CL 103 107 107 108  CO2 24 26 27 28   GLUCOSE 167* 99 83 77  BUN 33* 37* 25* 16  CREATININE 1.47* 1.18 1.08 0.92  CALCIUM 9.0 8.8* 8.4* 8.6*  AST 22  --   --   --   ALT 12  --   --   --   ALKPHOS 36*  --   --   --   BILITOT 1.2  --   --   --   ALBUMIN 3.1*  --   --   --   MG 2.3  --   --   --   INR 1.1  --   --   --   HGBA1C  --   --  5.1  --     No results for input(s): LIPASE, AMYLASE in the last 168 hours.  Recent Labs  Lab 08/10/20 0424  SARSCOV2NAA NEGATIVE    ------------------------------------------------------------------------------------------------------------------ No results for input(s): CHOL, HDL, LDLCALC, TRIG, CHOLHDL, LDLDIRECT in the last 72 hours.  Lab Results  Component Value Date   HGBA1C 5.1 08/11/2020   ------------------------------------------------------------------------------------------------------------------ No results for input(s): TSH, T4TOTAL, T3FREE, THYROIDAB in the last 72 hours.  Invalid input(s): FREET3 ------------------------------------------------------------------------------------------------------------------ No results for input(s): VITAMINB12, FOLATE, FERRITIN, TIBC, IRON, RETICCTPCT in the last 72 hours.  Coagulation profile  Recent Labs  Lab 08/09/20 2309  INR 1.1    No results for input(s): DDIMER in the last 72 hours.  Cardiac Enzymes  No results for input(s): CKMB, TROPONINI, MYOGLOBIN in the last 168 hours.  Invalid input(s):  CK ------------------------------------------------------------------------------------------------------------------ No results found for: BNP   Antibiotics: Anti-infectives (From admission, onward)   None       Radiology Reports  No results found.    DVT prophylaxis: Lovenox  Code Status: Full code  Family Communication: Discussed with patient's wife at bedside   Consultants:    Procedures:  Objective    Physical Examination:   General-appears in no acute distress  Heart-S1-S2, regular, no murmur auscultated  Lungs-clear to auscultation bilaterally, no wheezing or crackles auscultated  Abdomen-soft, nontender, no organomegaly  Extremities-no edema in the lower extremities  Neuro-alert, oriented to self only, no focal deficit noted   Status is: Inpatient  Dispo: The patient is from: Home              Anticipated d/c is to: Skilled nursing facility              Anticipated d/c date is: 08/12/2020              Patient currently not stable for discharge  Barrier to discharge-awaiting bed at skilled nursing facility  COVID-19 Labs  No results for input(s): DDIMER, FERRITIN, LDH, CRP in the last 72 hours.  Lab Results  Component Value Date   SARSCOV2NAA NEGATIVE 08/10/2020   SARSCOV2NAA NEGATIVE 03/01/2020    Microbiology  Recent Results (from the past 240 hour(s))  Urine Culture     Status: None   Collection Time: 08/10/20 12:29 AM   Specimen: Urine, Random  Result Value Ref Range Status   Specimen Description   Final    URINE, RANDOM Performed at [facility], [address], [place], [place] [address]    Special Requests   Final    NONE Performed at [facility], [address] [place], [place] [address]    Culture   Final    NO GROWTH Performed at [facility], [address] [place], [place] [address]    Report Status 08/11/2020 FINAL  Final  Resp Panel by RT-PCR (Flu A&B, Covid) Nasopharyngeal  Swab     Status: None   Collection Time: 08/10/20  4:24 AM   Specimen: Nasopharyngeal Swab; Nasopharyngeal(NP) swabs in vial transport medium  Result Value Ref Range Status   SARS Coronavirus 2 by RT PCR NEGATIVE NEGATIVE Final    Comment: (NOTE) SARS-CoV-2 target nucleic acids are NOT DETECTED.  The SARS-CoV-2 RNA is generally detectable in upper respiratory specimens during the acute phase of infection. The lowest concentration of SARS-CoV-2 viral copies this assay can detect is 138 copies/mL. A negative result does not preclude SARS-Cov-2 infection and should not be used as the sole basis for treatment or other patient management decisions. A negative result may occur with  improper specimen collection/handling, submission of specimen other than nasopharyngeal swab, presence of viral mutation(s) within the areas targeted by this assay, and inadequate number of viral copies(<138 copies/mL). A negative result must be combined with clinical observations, patient history, and epidemiological information. The expected result is Negative.  Fact Sheet for Patients:  BloggerCourse.com  Fact Sheet for Healthcare Providers:  SeriousBroker.it  This test is no t yet approved or cleared by the [place] FDA and  has been authorized for detection and/or diagnosis of SARS-CoV-2 by FDA under an Emergency Use Authorization (EUA). This EUA will remain  in effect (meaning this test can be used) for the duration of the COVID-19 declaration under Section 564(b)(1) of the Act, 21 U.S.C.section 360bbb-3(b)(1), unless the authorization is terminated  or revoked sooner.       Influenza A by PCR NEGATIVE NEGATIVE Final   Influenza B by PCR NEGATIVE NEGATIVE Final    Comment: (NOTE) The Xpert Xpress SARS-CoV-2/FLU/RSV plus assay is intended as an aid in the diagnosis of influenza from Nasopharyngeal swab specimens and should not be used as a sole  basis for treatment. Nasal washings and aspirates are unacceptable for Xpert Xpress SARS-CoV-2/FLU/RSV testing.  Fact Sheet for Patients: BloggerCourse.com  Fact Sheet for Healthcare Providers: SeriousBroker.it  This test is not yet approved or cleared by the [place] FDA and has been authorized for detection and/or diagnosis of SARS-CoV-2 by FDA under an Emergency Use Authorization (EUA). This EUA will remain in effect (meaning this test can be used) for the duration of the COVID-19 declaration under Section 564(b)(1) of the Act, 21 U.S.C. section 360bbb-3(b)(1), unless the authorization is terminated or revoked.  Performed at [facility], [address] [place], [place] [address]          Meredeth Ide   [facility] If 7PM-7AM, please contact night-coverage at [email or web address], Office  [phone]   08/12/2020, 3:13 PM  LOS: 1 day

## 2020-08-12 NOTE — TOC Progression Note (Signed)
Transition of Care [facility]) - Progression Note    Patient Details  Name: Peter Moore MRN: [id number] Date of Birth: 03-08-1941  Transition of Care [facility] CM/SW Contact  Allayne Butcher, RN Phone Number: 08/12/2020, 3:06 PM  Clinical Narrative:    Drucie Opitz still pending.    Expected Discharge Plan: Skilled Nursing Facility Barriers to Discharge: Insurance Authorization  Expected Discharge Plan and Services Expected Discharge Plan: Skilled Nursing Facility   Discharge Planning Services: CM Consult Post Acute Care Choice: Skilled Nursing Facility Living arrangements for the past 2 months: Single Family Home                 DME Arranged: N/A DME Agency: NA                   Social Determinants of Health (SDOH) Interventions    Readmission Risk Interventions No flowsheet data found.

## 2020-08-13 LAB — GLUCOSE, CAPILLARY
Glucose-Capillary: 93 mg/dL (ref 70–99)
Glucose-Capillary: 79 mg/dL (ref 70–99)
Glucose-Capillary: 120 mg/dL — ABNORMAL HIGH (ref 70–99)
Glucose-Capillary: 100 mg/dL — ABNORMAL HIGH (ref 70–99)

## 2020-08-13 NOTE — Plan of Care (Signed)
End of Shift Summary:  Alert and oriented to self. Weaned to 1L via [place]. No c/o dyspnea or pain. VSS. UOP adequate, (-) BM. Blood glucose monitoring. Remained free from falls or injury. Call bell within reach and able to use. Bed low and in locked position.

## 2020-08-13 NOTE — Progress Notes (Signed)
[facility]  PROGRESS NOTE  Peter Moore QQP:[id number] DOB: August 13, 1940 DOA: 08/09/2020 [facility]   Brief HPI:   80 year old Caucasian male with a history of CVA with residual right hemiparesis, diabetes mellitus type 2, hypertension, CAD came to ED with complaints of generalized weakness for past few weeks.  Usually he uses walker for ambulation but had a fall yesterday hitting his head.  Patient states he could barely go to the bathroom.  Also had lower molars on each side of the jaw extracted yesterday in [place].  He has been off his Plavix since the beginning of the month.  He was diagnosed with Parkinson disease on 07/29/2019 and started on Sinemet. Noncontrast head CT showed moderate right parieto-occipital scalp hematoma with no acute intracranial abnormalities.  It also showed chronic left MCA territory infarct with extensive encephalomalacia that is stable since prior examination on 03/01/2020.    Subjective   Patient seen and examined, continues to be pleasantly confused.   Assessment/Plan:     1. Orthostatic hypotension- secondary to hypovolemia and dehydration with subsequent fall in head injury.  Patient started on IV normal saline at 100 mL/h.  IV fluids have been changed to [facility].  We will recheck orthostatic vital signs today.  2. Mechanical fall/right parieto-occipital scalp hematoma-CT head shows moderate right parieto-occipital scalp hematoma.  No intracranial abnormality noted.  3. Cognitive decline-as per wife patient has had cognitive decline over past 2 years.  He was recently seen by [facility] at that time it was recommended to continue with Depakote, for post stroke seizure.  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  No new symptoms noted in the hospital.  Patient to follow-up with [facility] as outpatient.  4. Acute kidney injury-secondary to dehydration, poor p.o. intake, prerenal  azotemia.  Patient presented with creatinine of 1.47, was started on IV fluids.  BUN/creatinine is 16/0.92.    5. Gingival/gum bleeding after molar extraction-patient was given nebulized tranexamic acid with adequate hemostasis.  6. Hypertension-HCTZ and Cozaar are currently on hold due to acute kidney injury.  Continue as needed IV labetalol.  7. Diabetes mellitus type 2-Metformin on hold, continue sliding scale insulin with NovoLog.  CBG well controlled.    8. Parkinson disease-continue Sinemet.  9. Seizure disorder-continue Depakote.   Scheduled medications:   . aspirin EC  81 mg Oral Daily  . atorvastatin  40 mg Oral QHS  . carbidopa-levodopa  2 tablet Oral TID  . divalproex  500 mg Oral TID  . enoxaparin (LOVENOX) injection  40 mg Subcutaneous Q24H  . insulin aspart  0-9 Units Subcutaneous TID WC     Data Reviewed:   CBG:  Recent Labs  Lab 08/12/20 0748 08/12/20 1215 08/12/20 1614 08/12/20 2103 08/13/20 0754  GLUCAP 77 107* 111* 111* 93    SpO2: 98 % O2 Flow Rate (L/min): 1 L/min    Vitals:   08/12/20 2018 08/13/20 0020 08/13/20 0436 08/13/20 0721  BP: (!) 141/74 133/73 (!) 154/73 (!) 147/93  Pulse: 62 62 73 69  Resp: 17 17 17 16   Temp: (!) 97.5 F (36.4 C) 97.7 F (36.5 C) 97.9 F (36.6 C) (!) 97.2 F (36.2 C)  TempSrc: Oral Oral Oral Axillary  SpO2: 100% 98% 100% 98%  Weight:      Height:         Intake/Output Summary (Last 24 hours) at 08/13/2020 1059 Last data filed at 08/13/2020 0516 Gross per 24 hour  Intake 1584.43 ml  Output 350 ml  Net 1234.43 ml    03/31 1901 - 04/02 0700 In: 2751.1 [P.O.:840; I.V.:1911.1] Out: 2575 [Urine:2575]  Filed Weights   08/10/20 0605  Weight: 73.4 kg    CBC:  Recent Labs  Lab 08/09/20 2309 08/10/20 0654 08/10/20 1219 08/10/20 1834 08/11/20 0033 08/11/20 0638  WBC 10.5  --   --   --   --  7.5  HGB 12.3* 10.8* 9.4* 8.5* 8.1* 9.0*  HCT 38.8* 33.4* 29.4* 26.4* 25.2* 27.6*  PLT 147*  --   --   --    --  105*  MCV 99.7  --   --   --   --  98.6  MCH 31.6  --   --   --   --  32.1  MCHC 31.7  --   --   --   --  32.6  RDW 15.2  --   --   --   --  15.1  LYMPHSABS 1.6  --   --   --   --   --   MONOABS 1.3*  --   --   --   --   --   EOSABS 0.0  --   --   --   --   --   BASOSABS 0.1  --   --   --   --   --     Complete metabolic panel:  Recent Labs  Lab 08/09/20 2309 08/10/20 0654 08/11/20 0638 08/12/20 0500  NA 137 139 139 140  K 4.5 4.4 3.7 4.0  CL 103 107 107 108  CO2 24 26 27 28   GLUCOSE 167* 99 83 77  BUN 33* 37* 25* 16  CREATININE 1.47* 1.18 1.08 0.92  CALCIUM 9.0 8.8* 8.4* 8.6*  AST 22  --   --   --   ALT 12  --   --   --   ALKPHOS 36*  --   --   --   BILITOT 1.2  --   --   --   ALBUMIN 3.1*  --   --   --   MG 2.3  --   --   --   INR 1.1  --   --   --   HGBA1C  --   --  5.1  --     No results for input(s): LIPASE, AMYLASE in the last 168 hours.  Recent Labs  Lab 08/10/20 0424  SARSCOV2NAA NEGATIVE    ------------------------------------------------------------------------------------------------------------------ No results for input(s): CHOL, HDL, LDLCALC, TRIG, CHOLHDL, LDLDIRECT in the last 72 hours.  Lab Results  Component Value Date   HGBA1C 5.1 08/11/2020   ------------------------------------------------------------------------------------------------------------------ No results for input(s): TSH, T4TOTAL, T3FREE, THYROIDAB in the last 72 hours.  Invalid input(s): FREET3 ------------------------------------------------------------------------------------------------------------------ No results for input(s): VITAMINB12, FOLATE, FERRITIN, TIBC, IRON, RETICCTPCT in the last 72 hours.  Coagulation profile  Recent Labs  Lab 08/09/20 2309  INR 1.1    No results for input(s): DDIMER in the last 72 hours.  Cardiac Enzymes  No results for input(s): CKMB, TROPONINI, MYOGLOBIN in the last 168 hours.  Invalid input(s):  CK ------------------------------------------------------------------------------------------------------------------ No results found for: BNP   Antibiotics: Anti-infectives (From admission, onward)   None       Radiology Reports  No results found.    DVT prophylaxis: Lovenox  Code Status: Full code  Family Communication: Discussed with patient's wife at bedside   Consultants:    Procedures:  Objective    Physical Examination:   General-appears in no acute distress  Heart-S1-S2, regular, no murmur auscultated  Lungs-clear to auscultation bilaterally, no wheezing or crackles auscultated  Abdomen-soft, nontender, no organomegaly  Extremities-no edema in the lower extremities  Neuro-alert, oriented to self only, no focal deficit noted   Status is: Inpatient  Dispo: The patient is from: Home              Anticipated d/c is to: Skilled nursing facility              Anticipated d/c date is: 08/12/2020              Patient currently not stable for discharge  Barrier to discharge-awaiting bed at skilled nursing facility  COVID-19 Labs  No results for input(s): DDIMER, FERRITIN, LDH, CRP in the last 72 hours.  Lab Results  Component Value Date   SARSCOV2NAA NEGATIVE 08/10/2020   SARSCOV2NAA NEGATIVE 03/01/2020    Microbiology  Recent Results (from the past 240 hour(s))  Urine Culture     Status: None   Collection Time: 08/10/20 12:29 AM   Specimen: Urine, Random  Result Value Ref Range Status   Specimen Description   Final    URINE, RANDOM Performed at [facility], [address] [place], [place] [address]    Special Requests   Final    NONE Performed at [facility], [address] [place], [place] [address]    Culture   Final    NO GROWTH Performed at [facility], [address], [place], [place] [address]    Report Status 08/11/2020 FINAL  Final  Resp Panel by RT-PCR (Flu A&B, Covid) Nasopharyngeal  Swab     Status: None   Collection Time: 08/10/20  4:24 AM   Specimen: Nasopharyngeal Swab; Nasopharyngeal(NP) swabs in vial transport medium  Result Value Ref Range Status   SARS Coronavirus 2 by RT PCR NEGATIVE NEGATIVE Final    Comment: (NOTE) SARS-CoV-2 target nucleic acids are NOT DETECTED.  The SARS-CoV-2 RNA is generally detectable in upper respiratory specimens during the acute phase of infection. The lowest concentration of SARS-CoV-2 viral copies this assay can detect is 138 copies/mL. A negative result does not preclude SARS-Cov-2 infection and should not be used as the sole basis for treatment or other patient management decisions. A negative result may occur with  improper specimen collection/handling, submission of specimen other than nasopharyngeal swab, presence of viral mutation(s) within the areas targeted by this assay, and inadequate number of viral copies(<138 copies/mL). A negative result must be combined with clinical observations, patient history, and epidemiological information. The expected result is Negative.  Fact Sheet for Patients:  BloggerCourse.com  Fact Sheet for Healthcare Providers:  SeriousBroker.it  This test is no t yet approved or cleared by the [place] FDA and  has been authorized for detection and/or diagnosis of SARS-CoV-2 by FDA under an Emergency Use Authorization (EUA). This EUA will remain  in effect (meaning this test can be used) for the duration of the COVID-19 declaration under Section 564(b)(1) of the Act, 21 U.S.C.section 360bbb-3(b)(1), unless the authorization is terminated  or revoked sooner.       Influenza A by PCR NEGATIVE NEGATIVE Final   Influenza B by PCR NEGATIVE NEGATIVE Final    Comment: (NOTE) The Xpert Xpress SARS-CoV-2/FLU/RSV plus assay is intended as an aid in the diagnosis of influenza from Nasopharyngeal swab specimens and should not be used as a sole  basis for treatment. Nasal washings and aspirates are unacceptable for Xpert Xpress SARS-CoV-2/FLU/RSV testing.  Fact Sheet for Patients: BloggerCourse.comhttps://www.fda.gov/media/152166/download  Fact Sheet for Healthcare Providers: SeriousBroker.ithttps://www.fda.gov/media/152162/download  This test is not yet approved or cleared by the [place] FDA and has been authorized for detection and/or diagnosis of SARS-CoV-2 by FDA under an Emergency Use Authorization (EUA). This EUA will remain in effect (meaning this test can be used) for the duration of the COVID-19 declaration under Section 564(b)(1) of the Act, 21 U.S.C. section 360bbb-3(b)(1), unless the authorization is terminated or revoked.  Performed at [facility], [address] [place], [place] [address]          Meredeth IdeGagan S Debbrah Sampedro   [facility] If 7PM-7AM, please contact night-coverage at [email or web address], Office  [phone]   08/13/2020, 10:59 AM  LOS: 2 days

## 2020-08-14 LAB — GLUCOSE, CAPILLARY
Glucose-Capillary: 71 mg/dL (ref 70–99)
Glucose-Capillary: 94 mg/dL (ref 70–99)
Glucose-Capillary: 167 mg/dL — ABNORMAL HIGH (ref 70–99)
Glucose-Capillary: 81 mg/dL (ref 70–99)

## 2020-08-14 MED ORDER — OLOPATADINE HCL 0.1 % OP SOLN
1.0000 [drp] | Freq: Two times a day (BID) | OPHTHALMIC | Status: AC
  Administered 2020-08-14 – 2020-08-15 (×4): 1 [drp] via OPHTHALMIC
  Filled 2020-08-14: qty 5

## 2020-08-14 MED ORDER — LOSARTAN POTASSIUM 50 MG PO TABS
100.0000 mg | ORAL_TABLET | Freq: Every day | ORAL | Status: DC
  Administered 2020-08-14 – 2020-08-16 (×3): 100 mg via ORAL
  Filled 2020-08-14 (×3): qty 2

## 2020-08-14 NOTE — Plan of Care (Signed)

## 2020-08-14 NOTE — Progress Notes (Signed)
[facility]  PROGRESS NOTE  Peter Moore [id number]:[id number] DOB: [birth year] DOA: 08/09/2020 PCP: [facility]   Brief HPI:   80 year old Caucasian male with a history of CVA with residual right hemiparesis, diabetes mellitus type 2, hypertension, CAD came to ED with complaints of generalized weakness for past few weeks.  Usually he uses walker for ambulation but had a fall yesterday hitting his head.  Patient states he could barely go to the bathroom.  Also had lower molars on each side of the jaw extracted yesterday in [place].  He has been off his Plavix since the beginning of the month.  He was diagnosed with Parkinson disease on 07/29/2019 and started on Sinemet. Noncontrast head CT showed moderate right parieto-occipital scalp hematoma with no acute intracranial abnormalities.  It also showed chronic left MCA territory infarct with extensive encephalomalacia that is stable since prior examination on 03/01/2020.    Subjective   Patient seen and examined, denies chest pain or shortness of breath.   Assessment/Plan:     1. Orthostatic hypotension- secondary to hypovolemia and dehydration with subsequent fall in head injury.  Patient started on IV normal saline at 100 mL/h.  IV fluids have been changed to [facility]  Orthostatic vital signs could not be obtained as patient is unable to stand up without assistance.  We will try to obtain orthostatic vital signs when patient works with PT.  2. Mechanical fall/right parieto-occipital scalp hematoma-CT head shows moderate right parieto-occipital scalp hematoma.  No intracranial abnormality noted.  3. Cognitive decline-as per wife patient has had cognitive decline over past 2 years.  He was recently seen by [facility] neurology at that time it was recommended to continue with Depakote, for post stroke seizure.  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  No new symptoms noted in the hospital.  Patient  to follow-up with [facility] neurology as outpatient.  4. Acute kidney injury-secondary to dehydration, poor p.o. intake, prerenal azotemia.  Patient presented with creatinine of 1.47, was started on IV fluids.  BUN/creatinine is 16/0.92.    5. Gingival/gum bleeding after molar extraction-patient was given nebulized tranexamic acid with adequate hemostasis.  6. Hypertension-HCTZ and Cozaar are currently on hold due to acute kidney injury.  Blood pressure is now elevated, will start him back on Cozaar  7. Diabetes mellitus type 2-Metformin on hold, continue sliding scale insulin with NovoLog.  CBG well controlled.    8. Parkinson disease-continue Sinemet.  9. Seizure disorder-continue Depakote.   Scheduled medications:   . aspirin EC  81 mg Oral Daily  . atorvastatin  40 mg Oral QHS  . carbidopa-levodopa  2 tablet Oral TID  . divalproex  500 mg Oral TID  . enoxaparin (LOVENOX) injection  40 mg Subcutaneous Q24H  . insulin aspart  0-9 Units Subcutaneous TID WC  . olopatadine  1 drop Both Eyes BID     Data Reviewed:   CBG:  Recent Labs  Lab 08/13/20 0754 08/13/20 1151 08/13/20 1542 08/13/20 2111 08/14/20 0746  GLUCAP 93 79 120* 100* 71    SpO2: 100 % O2 Flow Rate (L/min): 1 L/min    Vitals:   08/13/20 2341 08/14/20 0601 08/14/20 0628 08/14/20 0723  BP: (!) 144/79  (!) 146/104 (!) 161/136  Pulse: 70 67 65 64  Resp: 17 15  18   Temp: 97.8 F (36.6 C) 98.5 F (36.9 C)  97.8 F (36.6 C)  TempSrc: Oral Oral  Oral  SpO2: 98% 99%  100%  Weight:      Height:         Intake/Output Summary (Last 24 hours) at 08/14/2020 1102 Last data filed at 08/14/2020 1033 Gross per 24 hour  Intake 240 ml  Output 500 ml  Net -260 ml    04/01 1901 - 04/03 0700 In: 1344.4 [P.O.:360; I.V.:984.4] Out: 850 [Urine:850]  Filed Weights   08/10/20 0605  Weight: 73.4 kg    CBC:  Recent Labs  Lab 08/09/20 2309 08/10/20 0654 08/10/20 1219 08/10/20 1834 08/11/20 0033 08/11/20 0638   WBC 10.5  --   --   --   --  7.5  HGB 12.3* 10.8* 9.4* 8.5* 8.1* 9.0*  HCT 38.8* 33.4* 29.4* 26.4* 25.2* 27.6*  PLT 147*  --   --   --   --  105*  MCV 99.7  --   --   --   --  98.6  MCH 31.6  --   --   --   --  32.1  MCHC 31.7  --   --   --   --  32.6  RDW 15.2  --   --   --   --  15.1  LYMPHSABS 1.6  --   --   --   --   --   MONOABS 1.3*  --   --   --   --   --   EOSABS 0.0  --   --   --   --   --   BASOSABS 0.1  --   --   --   --   --     Complete metabolic panel:  Recent Labs  Lab 08/09/20 2309 08/10/20 0654 08/11/20 0638 08/12/20 0500  NA 137 139 139 140  K 4.5 4.4 3.7 4.0  CL 103 107 107 108  CO2 24 26 27 28   GLUCOSE 167* 99 83 77  BUN 33* 37* 25* 16  CREATININE 1.47* 1.18 1.08 0.92  CALCIUM 9.0 8.8* 8.4* 8.6*  AST 22  --   --   --   ALT 12  --   --   --   ALKPHOS 36*  --   --   --   BILITOT 1.2  --   --   --   ALBUMIN 3.1*  --   --   --   MG 2.3  --   --   --   INR 1.1  --   --   --   HGBA1C  --   --  5.1  --     No results for input(s): LIPASE, AMYLASE in the last 168 hours.  Recent Labs  Lab 08/10/20 0424  SARSCOV2NAA NEGATIVE    ------------------------------------------------------------------------------------------------------------------ No results for input(s): CHOL, HDL, LDLCALC, TRIG, CHOLHDL, LDLDIRECT in the last 72 hours.  Lab Results  Component Value Date   HGBA1C 5.1 08/11/2020   ------------------------------------------------------------------------------------------------------------------ No results for input(s): TSH, T4TOTAL, T3FREE, THYROIDAB in the last 72 hours.  Invalid input(s): FREET3 ------------------------------------------------------------------------------------------------------------------ No results for input(s): VITAMINB12, FOLATE, FERRITIN, TIBC, IRON, RETICCTPCT in the last 72 hours.  Coagulation profile  Recent Labs  Lab 08/09/20 2309  INR 1.1    No results for input(s): DDIMER in the last 72  hours.  Cardiac Enzymes  No results for input(s): CKMB, TROPONINI, MYOGLOBIN in the last 168 hours.  Invalid input(s): CK ------------------------------------------------------------------------------------------------------------------ No results found for: BNP   Antibiotics: Anti-infectives (From admission, onward)   None  Radiology Reports  No results found.    DVT prophylaxis: Lovenox  Code Status: Full code  Family Communication: Discussed with patient's wife at bedside   Consultants:    Procedures:      Objective    Physical Examination:  General-appears in no acute distress Heart-S1-S2, regular, no murmur auscultated Lungs-clear to auscultation bilaterally, no wheezing or crackles auscultated Abdomen-soft, nontender, no organomegaly Extremities-no edema in the lower extremities Neuro-alert, oriented to self only, no focal deficit noted  Status is: Inpatient  Dispo: The patient is from: Home              Anticipated d/c is to: Skilled nursing facility              Anticipated d/c date is: 08/15/2020              Patient currently not stable for discharge  Barrier to discharge-awaiting bed at skilled nursing facility  COVID-19 Labs  No results for input(s): DDIMER, FERRITIN, LDH, CRP in the last 72 hours.  Lab Results  Component Value Date   SARSCOV2NAA NEGATIVE 08/10/2020   SARSCOV2NAA NEGATIVE 03/01/2020    Microbiology  Recent Results (from the past 240 hour(s))  Urine Culture     Status: None   Collection Time: 08/10/20 12:29 AM   Specimen: Urine, Random  Result Value Ref Range Status   Specimen Description   Final    URINE, RANDOM Performed at [facility], [address], [place], [place] [address]    Special Requests   Final    NONE Performed at [facility], [address], [place], [place] [address]    Culture   Final    NO GROWTH Performed at [facility], [address],  [place], [place] [address]    Report Status 08/11/2020 FINAL  Final  Resp Panel by RT-PCR (Flu A&B, Covid) Nasopharyngeal Swab     Status: None   Collection Time: 08/10/20  4:24 AM   Specimen: Nasopharyngeal Swab; Nasopharyngeal(NP) swabs in vial transport medium  Result Value Ref Range Status   SARS Coronavirus 2 by RT PCR NEGATIVE NEGATIVE Final    Comment: (NOTE) SARS-CoV-2 target nucleic acids are NOT DETECTED.  The SARS-CoV-2 RNA is generally detectable in upper respiratory specimens during the acute phase of infection. The lowest concentration of SARS-CoV-2 viral copies this assay can detect is 138 copies/mL. A negative result does not preclude SARS-Cov-2 infection and should not be used as the sole basis for treatment or other patient management decisions. A negative result may occur with  improper specimen collection/handling, submission of specimen other than nasopharyngeal swab, presence of viral mutation(s) within the areas targeted by this assay, and inadequate number of viral copies(<138 copies/mL). A negative result must be combined with clinical observations, patient history, and epidemiological information. The expected result is Negative.  Fact Sheet for Patients:  BloggerCourse.com  Fact Sheet for Healthcare Providers:  SeriousBroker.it  This test is no t yet approved or cleared by the [place] FDA and  has been authorized for detection and/or diagnosis of SARS-CoV-2 by FDA under an Emergency Use Authorization (EUA). This EUA will remain  in effect (meaning this test can be used) for the duration of the COVID-19 declaration under Section 564(b)(1) of the Act, 21 U.S.C.section 360bbb-3(b)(1), unless the authorization is terminated  or revoked sooner.       Influenza A by PCR NEGATIVE NEGATIVE Final   Influenza B by PCR NEGATIVE NEGATIVE Final  Comment: (NOTE) The Xpert Xpress SARS-CoV-2/FLU/RSV plus assay is  intended as an aid in the diagnosis of influenza from Nasopharyngeal swab specimens and should not be used as a sole basis for treatment. Nasal washings and aspirates are unacceptable for Xpert Xpress SARS-CoV-2/FLU/RSV testing.  Fact Sheet for Patients: BloggerCourse.comhttps://www.fda.gov/media/152166/download  Fact Sheet for Healthcare Providers: SeriousBroker.ithttps://www.fda.gov/media/152162/download  This test is not yet approved or cleared by the [place] FDA and has been authorized for detection and/or diagnosis of SARS-CoV-2 by FDA under an Emergency Use Authorization (EUA). This EUA will remain in effect (meaning this test can be used) for the duration of the COVID-19 declaration under Section 564(b)(1) of the Act, 21 U.S.C. section 360bbb-3(b)(1), unless the authorization is terminated or revoked.  Performed at [facility], [address] [place], [place] [address]          Meredeth IdeGagan S Jaeshawn Silvio   [facility] If 7PM-7AM, please contact night-coverage at [email or web address], Office  [phone]   08/14/2020, 11:02 AM  LOS: 3 days

## 2020-08-15 LAB — RESP PANEL BY RT-PCR (FLU A&B, COVID) ARPGX2
Influenza A by PCR: NEGATIVE
Influenza B by PCR: NEGATIVE
SARS Coronavirus 2 by RT PCR: NEGATIVE

## 2020-08-15 LAB — GLUCOSE, CAPILLARY
Glucose-Capillary: 75 mg/dL (ref 70–99)
Glucose-Capillary: 87 mg/dL (ref 70–99)
Glucose-Capillary: 96 mg/dL (ref 70–99)
Glucose-Capillary: 126 mg/dL — ABNORMAL HIGH (ref 70–99)

## 2020-08-15 NOTE — Care Management Important Message (Signed)
Important Message  Patient Details  Name: Peter Moore MRN: [id number] Date of Birth: 09/24/1940   Medicare Important Message Given:  Yes     Olegario Messier A Avaleigh Decuir 08/15/2020, 11:03 AM

## 2020-08-15 NOTE — Progress Notes (Signed)
[facility]  PROGRESS NOTE  Martinez Boxx CBS:[id number] DOB: 1940/10/26 DOA: 08/09/2020 [facility]CP: [facility]   Brief HPI:   80 year old Caucasian male with a history of CVA with residual right hemiparesis, diabetes mellitus type 2, hypertension, CAD came to ED with complaints of generalized weakness for past few weeks.  Usually he uses walker for ambulation but had a fall yesterday hitting his head.  Patient states he could barely go to the bathroom.  Also had lower molars on each side of the jaw extracted yesterday in [place].  He has been off his Plavix since the beginning of the month.  He was diagnosed with Parkinson disease on 07/29/2019 and started on Sinemet. Noncontrast head CT showed moderate right parieto-occipital scalp hematoma with no acute intracranial abnormalities.  It also showed chronic left MCA territory infarct with extensive encephalomalacia that is stable since prior examination on 03/01/2020.    Subjective   Patient seen and examined, denies any pain or shortness of breath.  Difficult communication due to cognitive decline.   Assessment/Plan:     1. Orthostatic hypotension-resolved, secondary to hypovolemia and dehydration with subsequent fall in head injury.  Patient was started on IV normal saline at 100 mL/h.  IV fluids have been changed to [facility].     2. Mechanical fall/right parieto-occipital scalp hematoma-CT head shows moderate right parieto-occipital scalp hematoma.  No intracranial abnormality noted.  3. Cognitive decline-as per wife patient has had cognitive decline over past 2 years.  He was recently seen by [facility] neurology at that time it was recommended to continue with Depakote, for post stroke seizure.  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  No new symptoms noted in the hospital.  Patient to follow-up with [facility] neurology as outpatient.  4. Acute kidney injury-secondary to dehydration, poor p.o.  intake, prerenal azotemia.  Patient presented with creatinine of 1.47, was started on IV fluids.  BUN/creatinine is 16/0.92.    5. Gingival/gum bleeding after molar extraction-patient was given nebulized tranexamic acid with adequate hemostasis.  6. Hypertension-HCTZ and Cozaar are currently on hold due to acute kidney injury.  Blood pressure is now elevated, will start him back on Cozaar  7. Diabetes mellitus type 2-Metformin on hold, continue sliding scale insulin with NovoLog.  CBG well controlled.    8. Parkinson disease-continue Sinemet.  9. Seizure disorder-continue Depakote.   Scheduled medications:   . aspirin EC  81 mg Oral Daily  . atorvastatin  40 mg Oral QHS  . carbidopa-levodopa  2 tablet Oral TID  . divalproex  500 mg Oral TID  . enoxaparin (LOVENOX) injection  40 mg Subcutaneous Q24H  . insulin aspart  0-9 Units Subcutaneous TID WC  . losartan  100 mg Oral Daily  . olopatadine  1 drop Both Eyes BID     Data Reviewed:   CBG:  Recent Labs  Lab 08/14/20 1204 08/14/20 1749 08/14/20 2053 08/15/20 0759 08/15/20 1235  GLUCAP 94 167* 81 75 87    SpO2: 94 % O2 Flow Rate (L/min): 1 L/min    Vitals:   08/15/20 0026 08/15/20 0435 08/15/20 0857 08/15/20 1200  BP: 120/74 (!) 142/99 130/63 121/66  Pulse: 65 66 67 72  Resp: 16 18 15 18   Temp: 98.1 F (36.7 C) 97.8 F (36.6 C) 98.8 F (37.1 C) 97.9 F (36.6 C)  TempSrc: Oral Oral Oral Oral  SpO2: 100% 100% 100% 94%  Weight:      Height:  Intake/Output Summary (Last 24 hours) at 08/15/2020 1353 Last data filed at 08/15/2020 1012 Gross per 24 hour  Intake 360 ml  Output 1175 ml  Net -815 ml    04/02 1901 - 04/04 0700 In: 360 [P.O.:360] Out: 1675 [Urine:1675]  Filed Weights   08/10/20 0605  Weight: 73.4 kg    CBC:  Recent Labs  Lab 08/09/20 2309 08/10/20 0654 08/10/20 1219 08/10/20 1834 08/11/20 0033 08/11/20 0638  WBC 10.5  --   --   --   --  7.5  HGB 12.3* 10.8* 9.4* 8.5* 8.1*  9.0*  HCT 38.8* 33.4* 29.4* 26.4* 25.2* 27.6*  PLT 147*  --   --   --   --  105*  MCV 99.7  --   --   --   --  98.6  MCH 31.6  --   --   --   --  32.1  MCHC 31.7  --   --   --   --  32.6  RDW 15.2  --   --   --   --  15.1  LYMPHSABS 1.6  --   --   --   --   --   MONOABS 1.3*  --   --   --   --   --   EOSABS 0.0  --   --   --   --   --   BASOSABS 0.1  --   --   --   --   --     Complete metabolic panel:  Recent Labs  Lab 08/09/20 2309 08/10/20 0654 08/11/20 0638 08/12/20 0500  NA 137 139 139 140  K 4.5 4.4 3.7 4.0  CL 103 107 107 108  CO2 24 26 27 28   GLUCOSE 167* 99 83 77  BUN 33* 37* 25* 16  CREATININE 1.47* 1.18 1.08 0.92  CALCIUM 9.0 8.8* 8.4* 8.6*  AST 22  --   --   --   ALT 12  --   --   --   ALKPHOS 36*  --   --   --   BILITOT 1.2  --   --   --   ALBUMIN 3.1*  --   --   --   MG 2.3  --   --   --   INR 1.1  --   --   --   HGBA1C  --   --  5.1  --     No results for input(s): LIPASE, AMYLASE in the last 168 hours.  Recent Labs  Lab 08/10/20 0424  SARSCOV2NAA NEGATIVE    ------------------------------------------------------------------------------------------------------------------ No results for input(s): CHOL, HDL, LDLCALC, TRIG, CHOLHDL, LDLDIRECT in the last 72 hours.  Lab Results  Component Value Date   HGBA1C 5.1 08/11/2020   ------------------------------------------------------------------------------------------------------------------ No results for input(s): TSH, T4TOTAL, T3FREE, THYROIDAB in the last 72 hours.  Invalid input(s): FREET3 ------------------------------------------------------------------------------------------------------------------ No results for input(s): VITAMINB12, FOLATE, FERRITIN, TIBC, IRON, RETICCTPCT in the last 72 hours.  Coagulation profile  Recent Labs  Lab 08/09/20 2309  INR 1.1    No results for input(s): DDIMER in the last 72 hours.  Cardiac Enzymes  No results for input(s): CKMB, TROPONINI,  MYOGLOBIN in the last 168 hours.  Invalid input(s): CK ------------------------------------------------------------------------------------------------------------------ No results found for: BNP   Antibiotics: Anti-infectives (From admission, onward)   None       Radiology Reports  No results found.    DVT prophylaxis: Lovenox  Code Status: Full  code  Family Communication: Discussed with patient's wife at bedside   Consultants:    Procedures:      Objective    Physical Examination:  General-appears in no acute distress Heart-S1-S2, regular, no murmur auscultated Lungs-clear to auscultation bilaterally, no wheezing or crackles auscultated Abdomen-soft, nontender, no organomegaly Extremities-no edema in the lower extremities Neuro-alert, oriented to self, no focal deficit noted  Status is: Inpatient  Dispo: The patient is from: Home              Anticipated d/c is to: Skilled nursing facility              Anticipated d/c date is: 08/16/2020              Patient currently not stable for discharge  Barrier to discharge-awaiting bed at skilled nursing facility  COVID-19 Labs  No results for input(s): DDIMER, FERRITIN, LDH, CRP in the last 72 hours.  Lab Results  Component Value Date   SARSCOV2NAA NEGATIVE 08/10/2020   SARSCOV2NAA NEGATIVE 03/01/2020    Microbiology  Recent Results (from the past 240 hour(s))  Urine Culture     Status: None   Collection Time: 08/10/20 12:29 AM   Specimen: Urine, Random  Result Value Ref Range Status   Specimen Description   Final    URINE, RANDOM Performed at [facility], [address] [place], [place] [address]    Special Requests   Final    NONE Performed at [facility], [address], [place], [place] [address]    Culture   Final    NO GROWTH Performed at [facility], [address] [place], [place] [address]    Report Status 08/11/2020 FINAL  Final  Resp Panel by  RT-PCR (Flu A&B, Covid) Nasopharyngeal Swab     Status: None   Collection Time: 08/10/20  4:24 AM   Specimen: Nasopharyngeal Swab; Nasopharyngeal(NP) swabs in vial transport medium  Result Value Ref Range Status   SARS Coronavirus 2 by RT PCR NEGATIVE NEGATIVE Final    Comment: (NOTE) SARS-CoV-2 target nucleic acids are NOT DETECTED.  The SARS-CoV-2 RNA is generally detectable in upper respiratory specimens during the acute phase of infection. The lowest concentration of SARS-CoV-2 viral copies this assay can detect is 138 copies/mL. A negative result does not preclude SARS-Cov-2 infection and should not be used as the sole basis for treatment or other patient management decisions. A negative result may occur with  improper specimen collection/handling, submission of specimen other than nasopharyngeal swab, presence of viral mutation(s) within the areas targeted by this assay, and inadequate number of viral copies(<138 copies/mL). A negative result must be combined with clinical observations, patient history, and epidemiological information. The expected result is Negative.  Fact Sheet for Patients:  BloggerCourse.com  Fact Sheet for Healthcare Providers:  SeriousBroker.it  This test is no t yet approved or cleared by the [place] FDA and  has been authorized for detection and/or diagnosis of SARS-CoV-2 by FDA under an Emergency Use Authorization (EUA). This EUA will remain  in effect (meaning this test can be used) for the duration of the COVID-19 declaration under Section 564(b)(1) of the Act, 21 U.S.C.section 360bbb-3(b)(1), unless the authorization is terminated  or revoked sooner.       Influenza A by PCR NEGATIVE NEGATIVE Final   Influenza B by PCR NEGATIVE NEGATIVE Final    Comment: (NOTE) The Xpert Xpress SARS-CoV-2/FLU/RSV plus assay is intended as an aid in the diagnosis  of influenza from Nasopharyngeal swab  specimens and should not be used as a sole basis for treatment. Nasal washings and aspirates are unacceptable for Xpert Xpress SARS-CoV-2/FLU/RSV testing.  Fact Sheet for Patients: BloggerCourse.comhttps://www.fda.gov/media/152166/download  Fact Sheet for Healthcare Providers: SeriousBroker.ithttps://www.fda.gov/media/152162/download  This test is not yet approved or cleared by the [place] FDA and has been authorized for detection and/or diagnosis of SARS-CoV-2 by FDA under an Emergency Use Authorization (EUA). This EUA will remain in effect (meaning this test can be used) for the duration of the COVID-19 declaration under Section 564(b)(1) of the Act, 21 U.S.C. section 360bbb-3(b)(1), unless the authorization is terminated or revoked.  Performed at [facility], [address] [place], [place] [address]          Meredeth IdeGagan S Trena Dunavan   [facility] If 7PM-7AM, please contact night-coverage at [email or web address], Office  [phone]   08/15/2020, 1:53 PM  LOS: 4 days

## 2020-08-15 NOTE — Progress Notes (Signed)
Occupational Therapy Treatment Patient Details Name: Peter Moore MRN: [id number] DOB: 02/13/1941 Today's Date: 08/15/2020    History of present illness Patient is a 80 yo male that presented to ED  for acute onset of worsening generalized weakness, as well as fall, work up showed pt was orthostatic, R parieto-occipital scalp hematoma. PMH of CVA with R sided hemiparesis, DM, HTN, CAD, Parkinsons, seizures.   OT comments  Pt seen for OT treatment on this date. Upon arrival to room, pt seated upright in bed, with wife assisting pt w/ feeding lunch. Pt reporting no pain at rest, and agreeable to OT tx. Pt with decreased alertness, strength, balance, and activity tolerance this date; pt required MAX A to sit EOB and was observed to close eyes for ~25% of session. Unable/unsafe to attempt sit<>stand transfer this date. Pt returned to supine with MAX A. Pt participated in bed-level ADLs with MIN A and UB/LB therex. Pt continues to benefit from skilled OT services to maximize return to PLOF and minimize risk of future falls, injury, caregiver burden, and readmission. Will continue to follow POC. Discharge recommendation remains appropriate.    Follow Up Recommendations  SNF    Equipment Recommendations  Other (comment) (defer)       Precautions / Restrictions Precautions Precautions: Fall Restrictions Weight Bearing Restrictions: No       Mobility Bed Mobility Overal bed mobility: Needs Assistance Bed Mobility: Supine to Sit;Sit to Supine     Supine to sit: Mod assist Sit to supine: Max assist   General bed mobility comments: MAX +2 for scooting towards [facility]    Transfers                 General transfer comment: unable/unsafe to attempt standing this session    Balance Overall balance assessment: Needs assistance Sitting-balance support: Bilateral upper extremity supported;Feet supported Sitting balance-Leahy Scale: Zero Sitting balance - Comments: requires assistance to  maintain sitting balance this date Postural control: Right lateral lean     Standing balance comment: unable                           ADL either performed or assessed with clinical judgement   ADL Overall ADL's : Needs assistance/impaired     Grooming: Wash/dry face;Minimal assistance;Bed level Grooming Details (indicate cue type and reason): MIN A for thoroughness                                               Cognition Arousal/Alertness: Awake/alert Behavior During Therapy: Flat affect Overall Cognitive Status: Impaired/Different from baseline                                 General Comments: Pt with decreased alertness this date, closing eyes for ~25% of session        Exercises General Exercises - Upper Extremity Shoulder Flexion: AROM;Left;5 reps;Supine Elbow Flexion: AROM;Both;10 reps;Supine Elbow Extension: AROM;Both;10 reps;Supine Digit Composite Flexion: AROM;Both;10 reps Composite Extension: AROM;Both;10 reps           Pertinent Vitals/ Pain       Pain Assessment: No/denies pain         Frequency  Min 2X/week        Progress Toward Goals  OT Goals(current goals can  now be found in the care plan section)  Progress towards OT goals: Progressing toward goals  Acute Rehab OT Goals Patient Stated Goal: to improve strength OT Goal Formulation: With patient/family Time For Goal Achievement: 08/24/20 Potential to Achieve Goals: Good  Plan Discharge plan remains appropriate;Frequency remains appropriate       AM-PAC OT "6 Clicks" Daily Activity     Outcome Measure   Help from another person eating meals?: A Little Help from another person taking care of personal grooming?: A Little Help from another person toileting, which includes using toliet, bedpan, or urinal?: Total Help from another person bathing (including washing, rinsing, drying)?: A Lot Help from another person to put on and taking off  regular upper body clothing?: A Lot Help from another person to put on and taking off regular lower body clothing?: A Lot 6 Click Score: 13    End of Session Equipment Utilized During Treatment: Oxygen  OT Visit Diagnosis: Muscle weakness (generalized) (M62.81);History of falling (Z91.81)   Activity Tolerance Patient limited by fatigue   Patient Left in bed;with call bell/phone within reach;with bed alarm set   Nurse Communication Mobility status        Time: 1031-5945 OT Time Calculation (min): 35 min  Charges: OT General Charges $OT Visit: 1 Visit OT Treatments $Therapeutic Activity: 23-37 mins   Matthew Folks, OTR/L [facility] [phone]

## 2020-08-15 NOTE — TOC Progression Note (Signed)
Transition of Care [facility]) - Progression Note    Patient Details  Name: Peter Moore MRN: [id number] Date of Birth: 08-19-1940  Transition of Care [facility]) CM/SW Contact  Allayne Butcher, RN Phone Number: 08/15/2020, 12:13 PM  Clinical Narrative:    [facility] reports that insurance authorization should be received today.  Patient's wife updated at the bedside.     Expected Discharge Plan: Skilled Nursing Facility Barriers to Discharge: Insurance Authorization  Expected Discharge Plan and Services Expected Discharge Plan: Skilled Nursing Facility   Discharge Planning Services: CM Consult Post Acute Care Choice: Skilled Nursing Facility Living arrangements for the past 2 months: Single Family Home                 DME Arranged: N/A DME Agency: NA                   Social Determinants of Health (SDOH) Interventions    Readmission Risk Interventions No flowsheet data found.

## 2020-08-15 NOTE — Progress Notes (Signed)
Physical Therapy Treatment Patient Details Name: Peter Moore MRN: [id number] DOB: 01/30/1941 Today's Date: 08/15/2020    History of Present Illness Patient is a 80 yo male that presented to ED  for acute onset of worsening generalized weakness, as well as fall, work up showed pt was orthostatic, R parieto-occipital scalp hematoma. PMH of CVA with R sided hemiparesis, DM, HTN, CAD, Parkinsons, seizures.    PT Comments    Patient received in bed, slumped posture, looking lethargic. Patient is difficult to understand. Agreeable to PT session. Reports he is feeling weaker than he had before. Patient requires max assist for bed mobility and min assist to maintain sitting up on side of bed. Leaning to his right in sitting. Patient's BP taken in lysing and sitting with 128/87 and 137/83 respectively. Patient requires total assist to scoot up in bed. He will continue to benefit from skilled PT while here to improve strength and functional independence.      Follow Up Recommendations  SNF;Supervision/Assistance - 24 hour     Equipment Recommendations       Recommendations for Other Services       Precautions / Restrictions Precautions Precautions: Fall Restrictions Weight Bearing Restrictions: No    Mobility  Bed Mobility Overal bed mobility: Needs Assistance Bed Mobility: Supine to Sit;Sit to Supine     Supine to sit: Max assist Sit to supine: Max assist   General bed mobility comments: Minimal assistance from patient given. Difficulty sitting up on side of bed requiring support to maintain sitting balance. Max +2 for sliding up in bed.    Transfers                 General transfer comment: unable to stand this session  Ambulation/Gait             General Gait Details: unable to stand or walk.   Stairs             Wheelchair Mobility    Modified Rankin (Stroke Patients Only)       Balance Overall balance assessment: Needs  assistance Sitting-balance support: Bilateral upper extremity supported Sitting balance-Leahy Scale: Zero Sitting balance - Comments: requires assistance to maintain sitting balance this date Postural control: Right lateral lean     Standing balance comment: unable                            Cognition Arousal/Alertness: Awake/alert Behavior During Therapy: Flat affect Overall Cognitive Status: No family/caregiver present to determine baseline cognitive functioning                                 General Comments: Patient confused this session. No family present.      Exercises      General Comments        Pertinent Vitals/Pain Pain Assessment: No/denies pain    Home Living                      Prior Function            PT Goals (current goals can now be found in the care plan section) Acute Rehab PT Goals Patient Stated Goal: to improve strength PT Goal Formulation: Patient unable to participate in goal setting Time For Goal Achievement: 08/24/20 Potential to Achieve Goals: Fair Progress towards PT goals: Progressing toward goals    Frequency  Min 2X/week      PT Plan Current plan remains appropriate    Co-evaluation              AM-PAC PT "6 Clicks" Mobility   Outcome Measure  Help needed turning from your back to your side while in a flat bed without using bedrails?: A Lot Help needed moving from lying on your back to sitting on the side of a flat bed without using bedrails?: A Lot Help needed moving to and from a bed to a chair (including a wheelchair)?: Total Help needed standing up from a chair using your arms (e.g., wheelchair or bedside chair)?: Total Help needed to walk in hospital room?: Total Help needed climbing 3-5 steps with a railing? : Total 6 Click Score: 8    End of Session Equipment Utilized During Treatment: Oxygen Activity Tolerance: Patient limited by lethargy;Patient limited by  fatigue Patient left: in bed;with call bell/phone within reach;with bed alarm set Nurse Communication: Mobility status PT Visit Diagnosis: Other abnormalities of gait and mobility (R26.89);Muscle weakness (generalized) (M62.81)     Time: 2025-4270 PT Time Calculation (min) (ACUTE ONLY): 20 min  Charges:  $Therapeutic Activity: 8-22 mins                     Katsumi Wisler, PT, GCS 08/15/20,11:10 AM

## 2020-08-15 NOTE — TOC Progression Note (Signed)
Transition of Care [facility]) - Progression Note    Patient Details  Name: Peter Moore MRN: [id number] Date of Birth: August 18, 1940  [facility]) CM/SW Contact  Allayne Butcher, RN Phone Number: 08/15/2020, 4:27 PM  Clinical Narrative:    No work from [employer] at this time.  Hopefully determination will be made tomorrow and patient can discharge to [facility].    Expected Discharge Plan: Skilled Nursing Facility Barriers to Discharge: Insurance Authorization  Expected Discharge Plan and Services Expected Discharge Plan: Skilled Nursing Facility   Discharge Planning Services: CM Consult Post Acute Care Choice: Skilled Nursing Facility Living arrangements for the past 2 months: Single Family Home                 DME Arranged: N/A DME Agency: NA                   Social Determinants of Health (SDOH) Interventions    Readmission Risk Interventions No flowsheet data found.

## 2020-08-16 LAB — GLUCOSE, CAPILLARY
Glucose-Capillary: 86 mg/dL (ref 70–99)
Glucose-Capillary: 98 mg/dL (ref 70–99)
Glucose-Capillary: 112 mg/dL — ABNORMAL HIGH (ref 70–99)
Glucose-Capillary: 121 mg/dL — ABNORMAL HIGH (ref 70–99)

## 2020-08-16 MED ORDER — LOSARTAN POTASSIUM 50 MG PO TABS
50.0000 mg | ORAL_TABLET | Freq: Every day | ORAL | Status: DC
  Administered 2020-08-17: 10:00:00 50 mg via ORAL
  Filled 2020-08-16: qty 1

## 2020-08-16 NOTE — TOC Progression Note (Signed)
Transition of Care [facility]) - Progression Note    Patient Details  Name: Peter Moore MRN: [id number] Date of Birth: [birth year]  Transition [facility] Care [facility]) CM/SW Contact  Allayne Butcher, RN Phone Number: 08/16/2020, 10:24 AM  Clinical Narrative:    Monia Pouch denied authorization for SNF-  Requested for MD to complete [facility].     Expected Discharge Plan: Skilled Nursing Facility Barriers to Discharge: Insurance Authorization  Expected Discharge Plan and Services Expected Discharge Plan: Skilled Nursing Facility   Discharge Planning Services: CM Consult Post Acute Care Choice: Skilled Nursing Facility Living arrangements for the past 2 months: Single Family Home                 DME Arranged: N/A DME Agency: NA                   Social Determinants of Health (SDOH) Interventions    Readmission Risk Interventions No flowsheet data found.

## 2020-08-16 NOTE — Progress Notes (Signed)
Occupational Therapy Treatment Patient Details Name: Peter Moore MRN: [id number] DOB: [birth year] Today's Date: 08/16/2020    History of present illness Patient is a 80 yo male that presented to ED  for acute onset of worsening generalized weakness, as well as fall, work up showed pt was orthostatic, R parieto-occipital scalp hematoma. PMH of CVA with R sided hemiparesis, DM, HTN, CAD, Parkinsons, seizures.   OT comments  Pt seen for OT treatment on this date. Upon arrival to room, pt asleep in bed, however easily awoken and motivated to participate in OT session. Pt with improved alertness this session and able to keep eyes open throughout. Pt instructed in bed-level UE exercises with yellow theraband. With theraband looped over bedrails, pt performed exercises for shoulder internal/external rotation and elbow flexion (x10 reps for each exercise with LUE and x5 reps for each exercise with RUE). Pt continues to be motivated to participate in OT, and self-initiated x3 extra reps of L UE internal rotation. Pt continues to present with decreased activity tolerance and strength, and required MAX A for rolling and bed-level LB dressing. Pt would benefit from continued skilled OT services to maximize return to PLOF and minimize risk of future falls, injury, caregiver burden, and readmission. Will continue to follow POC. Discharge recommendation remains appropriate.    Follow Up Recommendations  SNF    Equipment Recommendations  Other (comment) (defer to next venue of care)       Precautions / Restrictions Precautions Precautions: Fall Restrictions Weight Bearing Restrictions: No       Mobility Bed Mobility Overal bed mobility: Needs Assistance Bed Mobility: Rolling Rolling: Max assist         General bed mobility comments: MAX A to roll to L side                         ADL either performed or assessed with clinical judgement   ADL Overall ADL's : Needs  assistance/impaired                     Lower Body Dressing: Maximal assistance;Bed level Lower Body Dressing Details (indicate cue type and reason): to don/doff socks                               Cognition Arousal/Alertness: Awake/alert Behavior During Therapy: Flat affect Overall Cognitive Status: Within Functional Limits for tasks assessed                                 General Comments: Pt alert and able to keep eyes open throughout session        Exercises Other Exercises Other Exercises: Pt instructed in bed-level UE exercises with (yellow) theraband, which required pt to loop theraband over bedrails and perform exercises for shoulder internal/external rotation and elbow flexion. Pt able to perform each exercise for x10 reps with LUE and x5 reps with RUE           Pertinent Vitals/ Pain       Pain Assessment: No/denies pain         Frequency  Min 2X/week        Progress Toward Goals  OT Goals(Moore goals can now be found in the care plan section)  Progress towards OT goals: Progressing toward goals  Acute Rehab OT Goals Patient Stated Goal: to improve  strength Time For Goal Achievement: 08/24/20 Potential to Achieve Goals: Good  Plan Discharge plan remains appropriate;Frequency remains appropriate       AM-PAC OT "6 Clicks" Daily Activity     Outcome Measure   Help from another person eating meals?: A Little Help from another person taking care of personal grooming?: A Little Help from another person toileting, which includes using toliet, bedpan, or urinal?: Total Help from another person bathing (including washing, rinsing, drying)?: A Lot Help from another person to put on and taking off regular upper body clothing?: A Lot Help from another person to put on and taking off regular lower body clothing?: A Lot 6 Click Score: 13    End of Session Equipment Utilized During Treatment: Oxygen  OT Visit Diagnosis:  Muscle weakness (generalized) (M62.81);History of falling (Z91.81)   Activity Tolerance Patient tolerated treatment well   Patient Left in bed;with call bell/phone within reach;with bed alarm set   Nurse Communication Mobility status        Time: 0981-1914 OT Time Calculation (min): 27 min  Charges: OT General Charges $OT Visit: 1 Visit OT Treatments $Therapeutic Activity: 23-37 mins   Matthew Folks, OTR/L [facility] [phone]

## 2020-08-16 NOTE — Progress Notes (Signed)
[facility]  PROGRESS NOTE  Ewel Lona QZR:[id number] DOB: Oct 24, 1940 DOA: 08/09/2020 PCP: [facility]   Brief HPI:   80 year old Caucasian male with a history of CVA with residual right hemiparesis, diabetes mellitus type 2, hypertension, CAD came to ED with complaints of generalized weakness for past few weeks.  Usually he uses walker for ambulation but had a fall yesterday hitting his head.  Patient states he could barely go to the bathroom.  Also had lower molars on each side of the jaw extracted yesterday in [place].  He has been off his Plavix since the beginning of the month.  He was diagnosed with Parkinson disease on 07/29/2019 and started on Sinemet. Noncontrast head CT showed moderate right parieto-occipital scalp hematoma with no acute intracranial abnormalities.  It also showed chronic left MCA territory infarct with extensive encephalomalacia that is stable since prior examination on 03/01/2020.    Subjective   Patient seen and examined, denies any new complaints.  No chest pain or shortness of breath.   Assessment/Plan:     1. Orthostatic hypotension-resolved, secondary to hypovolemia and dehydration with subsequent fall in head injury.  Patient was started on IV normal saline at 100 mL/h.  IV fluids have been changed to [facility].     2. Mechanical fall/right parieto-occipital scalp hematoma-CT head shows moderate right parieto-occipital scalp hematoma.  No intracranial abnormality noted.  3. Cognitive decline-as per wife patient has had cognitive decline over past 2 years.  He was recently seen by [facility] neurology at that time it was recommended to continue with Depakote, for post stroke seizure.  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  No new symptoms noted in the hospital.  Patient to follow-up with [facility] neurology as outpatient.  4. Acute kidney injury-secondary to dehydration, poor p.o. intake, prerenal  azotemia.  Patient presented with creatinine of 1.47, was started on IV fluids.  BUN/creatinine is 16/0.92.    5. Gingival/gum bleeding after molar extraction-patient was given nebulized tranexamic acid with adequate hemostasis.  6. Hypertension-HCTZ and Cozaar are currently on hold due to acute kidney injury.  Blood pressure was elevated, he was started back on Cozaar 100 mg daily, however this morning blood pressure is 100/57.  Will cut down the dose of Cozaar to 50 mg daily.   7. Diabetes mellitus type 2-Metformin on hold, continue sliding scale insulin with NovoLog.  CBG well controlled.    8. Parkinson disease-continue Sinemet.  9. Seizure disorder-continue Depakote.   Scheduled medications:   . aspirin EC  81 mg Oral Daily  . atorvastatin  40 mg Oral QHS  . carbidopa-levodopa  2 tablet Oral TID  . divalproex  500 mg Oral TID  . enoxaparin (LOVENOX) injection  40 mg Subcutaneous Q24H  . insulin aspart  0-9 Units Subcutaneous TID WC  . losartan  100 mg Oral Daily     Data Reviewed:   CBG:  Recent Labs  Lab 08/15/20 1235 08/15/20 1701 08/15/20 2122 08/16/20 0758 08/16/20 1202  GLUCAP 87 96 126* 86 98    SpO2: 97 % O2 Flow Rate (L/min): 1 L/min    Vitals:   08/16/20 0439 08/16/20 0757 08/16/20 0950 08/16/20 1111  BP: (!) 157/81 (!) 147/59 132/83 (!) 100/57  Pulse: 66 66  66  Resp: 20 18  16   Temp: 97.8 F (36.6 C) 98.7 F (37.1 C)  98.8 F (37.1 C)  TempSrc:      SpO2: 100% 95%  97%  Weight:      Height:         Intake/Output Summary (Last 24 hours) at 08/16/2020 1356 Last data filed at 08/16/2020 1213 Gross per 24 hour  Intake --  Output 1000 ml  Net -1000 ml    04/03 1901 - 04/05 0700 In: 120 [P.O.:120] Out: 1675 [Urine:1675]  Filed Weights   08/10/20 0605  Weight: 73.4 kg    CBC:  Recent Labs  Lab 08/09/20 2309 08/10/20 0654 08/10/20 1219 08/10/20 1834 08/11/20 0033 08/11/20 0638  WBC 10.5  --   --   --   --  7.5  HGB 12.3* 10.8*  9.4* 8.5* 8.1* 9.0*  HCT 38.8* 33.4* 29.4* 26.4* 25.2* 27.6*  PLT 147*  --   --   --   --  105*  MCV 99.7  --   --   --   --  98.6  MCH 31.6  --   --   --   --  32.1  MCHC 31.7  --   --   --   --  32.6  RDW 15.2  --   --   --   --  15.1  LYMPHSABS 1.6  --   --   --   --   --   MONOABS 1.3*  --   --   --   --   --   EOSABS 0.0  --   --   --   --   --   BASOSABS 0.1  --   --   --   --   --     Complete metabolic panel:  Recent Labs  Lab 08/09/20 2309 08/10/20 0654 08/11/20 0638 08/12/20 0500  NA 137 139 139 140  K 4.5 4.4 3.7 4.0  CL 103 107 107 108  CO2 24 26 27 28   GLUCOSE 167* 99 83 77  BUN 33* 37* 25* 16  CREATININE 1.47* 1.18 1.08 0.92  CALCIUM 9.0 8.8* 8.4* 8.6*  AST 22  --   --   --   ALT 12  --   --   --   ALKPHOS 36*  --   --   --   BILITOT 1.2  --   --   --   ALBUMIN 3.1*  --   --   --   MG 2.3  --   --   --   INR 1.1  --   --   --   HGBA1C  --   --  5.1  --     No results for input(s): LIPASE, AMYLASE in the last 168 hours.  Recent Labs  Lab 08/10/20 0424 08/15/20 1306  SARSCOV2NAA NEGATIVE NEGATIVE    ------------------------------------------------------------------------------------------------------------------ No results for input(s): CHOL, HDL, LDLCALC, TRIG, CHOLHDL, LDLDIRECT in the last 72 hours.  Lab Results  Component Value Date   HGBA1C 5.1 08/11/2020   ------------------------------------------------------------------------------------------------------------------ No results for input(s): TSH, T4TOTAL, T3FREE, THYROIDAB in the last 72 hours.  Invalid input(s): FREET3 ------------------------------------------------------------------------------------------------------------------ No results for input(s): VITAMINB12, FOLATE, FERRITIN, TIBC, IRON, RETICCTPCT in the last 72 hours.  Coagulation profile  Recent Labs  Lab 08/09/20 2309  INR 1.1    No results for input(s): DDIMER in the last 72 hours.  Cardiac Enzymes  No  results for input(s): CKMB, TROPONINI, MYOGLOBIN in the last 168 hours.  Invalid input(s): CK ------------------------------------------------------------------------------------------------------------------ No results found for: BNP   Antibiotics: Anti-infectives (From admission, onward)   None  Radiology Reports  No results found.    DVT prophylaxis: Lovenox  Code Status: Full code  Family Communication: Discussed with patient's wife at bedside   Consultants:    Procedures:      Objective    Physical Examination:  General-appears in no acute distress Heart-S1-S2, regular, no murmur auscultated Lungs-clear to auscultation bilaterally, no wheezing or crackles auscultated Abdomen-soft, nontender, no organomegaly Extremities-no edema in the lower extremities Neuro-alert, oriented x2 , no focal deficit noted  Status is: Inpatient  Dispo: The patient is from: Home              Anticipated d/c is to: Skilled nursing facility              Anticipated d/c date is: 08/18/2020              Patient currently not stable for discharge  Barrier to discharge-awaiting bed at skilled nursing facility  COVID-19 Labs  No results for input(s): DDIMER, FERRITIN, LDH, CRP in the last 72 hours.  Lab Results  Component Value Date   SARSCOV2NAA NEGATIVE 08/15/2020   SARSCOV2NAA NEGATIVE 08/10/2020   SARSCOV2NAA NEGATIVE 03/01/2020    Microbiology  Recent Results (from the past 240 hour(s))  Urine Culture     Status: None   Collection Time: 08/10/20 12:29 AM   Specimen: Urine, Random  Result Value Ref Range Status   Specimen Description   Final    URINE, RANDOM Performed at [facility], [address] [place], [place] [address]    Special Requests   Final    NONE Performed at [facility], [address] [place], [place] [address]    Culture   Final    NO GROWTH Performed at [facility], [address] [place],  [place] [address]    Report Status 08/11/2020 FINAL  Final  Resp Panel by RT-PCR (Flu A&B, Covid) Nasopharyngeal Swab     Status: None   Collection Time: 08/10/20  4:24 AM   Specimen: Nasopharyngeal Swab; Nasopharyngeal(NP) swabs in vial transport medium  Result Value Ref Range Status   SARS Coronavirus 2 by RT PCR NEGATIVE NEGATIVE Final    Comment: (NOTE) SARS-CoV-2 target nucleic acids are NOT DETECTED.  The SARS-CoV-2 RNA is generally detectable in upper respiratory specimens during the acute phase of infection. The lowest concentration of SARS-CoV-2 viral copies this assay can detect is 138 copies/mL. A negative result does not preclude SARS-Cov-2 infection and should not be used as the sole basis for treatment or other patient management decisions. A negative result may occur with  improper specimen collection/handling, submission of specimen other than nasopharyngeal swab, presence of viral mutation(s) within the areas targeted by this assay, and inadequate number of viral copies(<138 copies/mL). A negative result must be combined with clinical observations, patient history, and epidemiological information. The expected result is Negative.  Fact Sheet for Patients:  BloggerCourse.com  Fact Sheet for Healthcare Providers:  SeriousBroker.it  This test is no t yet approved or cleared by the [place] FDA and  has been authorized for detection and/or diagnosis of SARS-CoV-2 by FDA under an Emergency Use Authorization (EUA). This EUA will remain  in effect (meaning this test can be used) for the duration of the COVID-19 declaration under Section 564(b)(1) of the Act, 21 U.S.C.section 360bbb-3(b)(1), unless the authorization is terminated  or revoked sooner.       Influenza A by PCR NEGATIVE NEGATIVE Final   Influenza B by PCR NEGATIVE  NEGATIVE Final    Comment: (NOTE) The Xpert Xpress SARS-CoV-2/FLU/RSV plus assay is intended as  an aid in the diagnosis of influenza from Nasopharyngeal swab specimens and should not be used as a sole basis for treatment. Nasal washings and aspirates are unacceptable for Xpert Xpress SARS-CoV-2/FLU/RSV testing.  Fact Sheet for Patients: BloggerCourse.com  Fact Sheet for Healthcare Providers: SeriousBroker.it  This test is not yet approved or cleared by the [place] FDA and has been authorized for detection and/or diagnosis of SARS-CoV-2 by FDA under an Emergency Use Authorization (EUA). This EUA will remain in effect (meaning this test can be used) for the duration of the COVID-19 declaration under Section 564(b)(1) of the Act, 21 U.S.C. section 360bbb-3(b)(1), unless the authorization is terminated or revoked.  Performed at [facility], [address] [place], [place] [address]   Resp Panel by RT-PCR (Flu A&B, Covid) Nasopharyngeal Swab     Status: None   Collection Time: 08/15/20  1:06 PM   Specimen: Nasopharyngeal Swab; Nasopharyngeal(NP) swabs in vial transport medium  Result Value Ref Range Status   SARS Coronavirus 2 by RT PCR NEGATIVE NEGATIVE Final    Comment: (NOTE) SARS-CoV-2 target nucleic acids are NOT DETECTED.  The SARS-CoV-2 RNA is generally detectable in upper respiratory specimens during the acute phase of infection. The lowest concentration of SARS-CoV-2 viral copies this assay can detect is 138 copies/mL. A negative result does not preclude SARS-Cov-2 infection and should not be used as the sole basis for treatment or other patient management decisions. A negative result may occur with  improper specimen collection/handling, submission of specimen other than nasopharyngeal swab, presence of viral mutation(s) within the areas targeted by this assay, and inadequate number of viral copies(<138 copies/mL). A negative result must be combined with clinical observations, patient history, and  epidemiological information. The expected result is Negative.  Fact Sheet for Patients:  BloggerCourse.com  Fact Sheet for Healthcare Providers:  SeriousBroker.it  This test is no t yet approved or cleared by the [place] FDA and  has been authorized for detection and/or diagnosis of SARS-CoV-2 by FDA under an Emergency Use Authorization (EUA). This EUA will remain  in effect (meaning this test can be used) for the duration of the COVID-19 declaration under Section 564(b)(1) of the Act, 21 U.S.C.section 360bbb-3(b)(1), unless the authorization is terminated  or revoked sooner.       Influenza A by PCR NEGATIVE NEGATIVE Final   Influenza B by PCR NEGATIVE NEGATIVE Final    Comment: (NOTE) The Xpert Xpress SARS-CoV-2/FLU/RSV plus assay is intended as an aid in the diagnosis of influenza from Nasopharyngeal swab specimens and should not be used as a sole basis for treatment. Nasal washings and aspirates are unacceptable for Xpert Xpress SARS-CoV-2/FLU/RSV testing.  Fact Sheet for Patients: BloggerCourse.com  Fact Sheet for Healthcare Providers: SeriousBroker.it  This test is not yet approved or cleared by the [place] FDA and has been authorized for detection and/or diagnosis of SARS-CoV-2 by FDA under an Emergency Use Authorization (EUA). This EUA will remain in effect (meaning this test can be used) for the duration of the COVID-19 declaration under Section 564(b)(1) of the Act, 21 U.S.C. section 360bbb-3(b)(1), unless the authorization is terminated or revoked.  Performed at [facility], [address], [place], [place] [address]          Meredeth Ide   [facility] If 7PM-7AM, please contact night-coverage at [email or web address], Office  [phone]   08/16/2020, 1:56 PM  LOS: 5 days

## 2020-08-16 NOTE — TOC Progression Note (Signed)
Transition of Care [facility]) - Progression Note    Patient Details  Name: Peter Moore MRN: [id number] Date of Birth: 1941/01/22  Transition of Care [facility]) CM/SW Contact  Allayne Butcher, RN Phone Number: 08/16/2020, 3:35 PM  Clinical Narrative:    MD reports he has called and left his information but he has not heard back from the Medical Director at [facility] yet.   Expected Discharge Plan: Skilled Nursing Facility Barriers to Discharge: Insurance Authorization  Expected Discharge Plan and Services Expected Discharge Plan: Skilled Nursing Facility   Discharge Planning Services: CM Consult Post Acute Care Choice: Skilled Nursing Facility Living arrangements for the past 2 months: Single Family Home                 DME Arranged: N/A DME Agency: NA                   Social Determinants of Health (SDOH) Interventions    Readmission Risk Interventions No flowsheet data found.

## 2020-08-17 DIAGNOSIS — G20 Parkinson's disease: Secondary | ICD-10-CM

## 2020-08-17 DIAGNOSIS — F0281 Dementia in other diseases classified elsewhere with behavioral disturbance: Secondary | ICD-10-CM

## 2020-08-17 LAB — CBC
HCT: 28.9 % — ABNORMAL LOW (ref 39.0–52.0)
Hemoglobin: 9.5 g/dL — ABNORMAL LOW (ref 13.0–17.0)
MCH: 32.2 pg (ref 26.0–34.0)
MCHC: 32.9 g/dL (ref 30.0–36.0)
MCV: 98 fL (ref 80.0–100.0)
Platelets: 132 10*3/uL — ABNORMAL LOW (ref 150–400)
RBC: 2.95 MIL/uL — ABNORMAL LOW (ref 4.22–5.81)
RDW: 15.4 % (ref 11.5–15.5)
WBC: 8 10*3/uL (ref 4.0–10.5)
nRBC: 0 % (ref 0.0–0.2)

## 2020-08-17 LAB — BASIC METABOLIC PANEL
Anion gap: 5 (ref 5–15)
BUN: 21 mg/dL (ref 8–23)
CO2: 32 mmol/L (ref 22–32)
Calcium: 8.9 mg/dL (ref 8.9–10.3)
Chloride: 104 mmol/L (ref 98–111)
Creatinine, Ser: 1.09 mg/dL (ref 0.61–1.24)
GFR, Estimated: >60 mL/min (ref >60)
Glucose, Bld: 83 mg/dL (ref 70–99)
Potassium: 4.2 mmol/L (ref 3.5–5.1)
Sodium: 141 mmol/L (ref 135–145)

## 2020-08-17 LAB — GLUCOSE, CAPILLARY
Glucose-Capillary: 98 mg/dL (ref 70–99)
Glucose-Capillary: 88 mg/dL (ref 70–99)
Glucose-Capillary: 94 mg/dL (ref 70–99)

## 2020-08-17 MED ORDER — DIVALPROEX SODIUM 500 MG PO DR TAB: 500.0000 mg | DELAYED_RELEASE_TABLET | Freq: Two times a day (BID) | ORAL | Status: DC

## 2020-08-17 MED ORDER — LOSARTAN POTASSIUM 50 MG PO TABS: 50.0000 mg | ORAL_TABLET | Freq: Every day | ORAL | Status: DC

## 2020-08-17 MED ORDER — CARBIDOPA-LEVODOPA 25-100 MG PO TABS: 1.0000 | ORAL_TABLET | Freq: Three times a day (TID) | ORAL | Status: DC

## 2020-08-17 NOTE — TOC Transition Note (Signed)
Transition of Care [facility]) - CM/SW Discharge Note   Patient Details  Name: Rashed Edler MRN: [id number] Date of Birth: 1940-09-18  Transition of Care [facility] CM/SW Contact:  Allayne Butcher, RN Phone Number: 08/17/2020, 10:57 AM   Clinical Narrative:    Patient is medically cleared for discharge to [facility] today and peer to peer came back with [facility] approval yesterday afternoon.  Wife at the bedside and notified that insurance [employer] Harvey has been approved.  Patient will be going to room 605B.  [facility] has been called for transport they have several pickups ahead of him.  Bedside RN will call report.    Final next level of care: Skilled Nursing Facility Barriers to Discharge: Barriers Resolved   Patient Goals and CMS Choice Patient states their goals for this hospitalization and ongoing recovery are:: patient unable to voice goals CMS Medicare.gov Compare Post Acute Care list provided to:: Patient Represenative (must comment) Choice offered to / list presented to : Spouse  Discharge Placement              Patient chooses bed at: [facility] [place] Patient to be transferred to facility by: [facility] Name of family member notified: Bonita Quin Patient and family notified of of transfer: 08/17/20  Discharge Plan and Services   Discharge Planning Services: CM Consult Post Acute Care Choice: Skilled Nursing Facility          DME Arranged: N/A DME Agency: NA                  Social Determinants of Health (SDOH) Interventions     Readmission Risk Interventions No flowsheet data found.

## 2020-08-17 NOTE — Progress Notes (Signed)
Patient being discharged to [facility], called report to [facility] nurse receiving patient. Patient will wil transported via EMS.

## 2020-08-17 NOTE — Discharge Summary (Signed)
Physician Discharge Summary  Patient ID: Peter Moore MRN: [id number] DOB/AGE: [birth year] 80 y.o.  Admit date: 08/09/2020 Discharge date: 08/17/2020  Admission Diagnoses:  Discharge Diagnoses:  Active Problems:   Orthostatic hypotension   Discharged Condition: fair  Hospital Course:  80 year old Caucasian male with a history of CVA with residual right hemiparesis, diabetes mellitus type 2, hypertension, CAD came to ED with complaints of generalized weakness for past few weeks. Usually he uses walker for ambulation but had a fall yesterday hitting his head. Patient states he could barely go to the bathroom. Also had lower molars on each side of the jaw extracted yesterday in [place]. He has been off his Plavix since the beginning of the month. He was diagnosed with Parkinson disease on 07/29/2019 and started on Sinemet. Noncontrast head CT showed moderate right parieto-occipital scalp hematoma with no acute intracranial abnormalities. It also showed chronic left MCA territory infarct with extensive encephalomalacia that is stable since prior examination on 03/01/2020.  #1.  Orthostatic hypotension secondary to Parkinson disease and dehydration. Mechanical fall secondary to orthostatic hypotension. Right scalp hematoma secondary to fall. Parkinson dementia. Seizure disorder. Patient received IV fluids after arriving the hospital, blood pressure has been stable.  Blood pressure has been adjusted in dose, will allow higher blood pressure to prevent a fall. Discussed with patient wife, patient condition has been deteriorating since started Sinemet.  Patient was also taking a higher dose of Depakote. After discussion with wife, I will decrease the dose of Sinemet, also decrease dose of Depakote to 500 mg twice a day.  Patient will be followed with neurology in 2 weeks to determine if adjustment of the dose is needed again.  #2.  Acute kidney injury secondary dehydration and poor p.o.  intake. Condition had improved  3. essential hypertension Blood pressure medicines have been adjusted.  #4.  Anemia. Added B12 and iron level to the labs, results may not be available today.  Please follow-up on the results.   Consults: None  Significant Diagnostic Studies:  CT HEAD WITHOUT CONTRAST  CT CERVICAL SPINE WITHOUT CONTRAST  TECHNIQUE: Multidetector CT imaging of the head and cervical spine was performed following the standard protocol without intravenous contrast. Multiplanar CT image reconstructions of the cervical spine were also generated.  COMPARISON:  03/01/2020  FINDINGS: CT HEAD FINDINGS  Brain: Extensive and small full malacia within the left frontal and temporal lobes is again identified as well as the insular cortex related to remote left MCA territory infarct. Remote lacunar infarct noted within the left basal ganglia. Mild diffuse parenchymal volume loss is commensurate with the patient's age. Mild periventricular white matter changes are present likely reflecting the sequela of small vessel ischemia.  No abnormal intra or extra-axial mass lesion or fluid collection. No abnormal mass effect or midline shift. No evidence of acute intracranial hemorrhage or infarct. Ventricular size is normal. Cerebellum is unremarkable.  Vascular: Extensive vascular calcifications noted within the carotid siphons. No asymmetric hyperdense vasculature at the skull base.  Skull: The calvarium is intact.  Sinuses/Orbits: The orbits are unremarkable. The paranasal sinuses are clear.  Other: Mastoid air cells and middle ear cavities are clear. Moderate right parieto-occipital scalp hematoma is present  CT CERVICAL SPINE FINDINGS  Alignment: Normal cervical lordosis.  No listhesis peer  Skull base and vertebrae: The craniocervical junction is unremarkable. The atlantodental interval is normal. No acute fracture of the cervical spine. No lytic or  blastic bone lesion.  Soft tissues and spinal canal: No prevertebral fluid  or swelling. No visible canal hematoma.  Disc levels: There is mild intervertebral disc space narrowing and endplate remodeling at C5-6 and C6-7 in keeping with changes of mild degenerative disc disease. Remaining intervertebral disc heights are preserved. Vertebral body heights are preserved. The spinal canal is widely patent. The prevertebral soft tissues are not thickened on sagittal reformats. Multilevel facet arthrosis results in mild multilevel neuroforaminal narrowing, most severe on the left at C3-4 and on the right at C4-5.  Upper chest: Visualized lung apices are clear bilaterally.  Other: None  IMPRESSION: Moderate right parieto-occipital scalp hematoma. No calvarial fracture. No acute intracranial injury.  Chronic left MCA territory infarct with extensive encephalomalacia as described above, stable since prior examination.  No acute fracture or listhesis of the cervical spine.   Electronically Signed   By: Helyn Numbers MD   On: 08/10/2020 00:14    Treatments: Symptomatic treatment.  Discharge Exam: Blood pressure 121/72, pulse 64, temperature 98.3 F (36.8 C), temperature source Oral, resp. rate 16, height 5\' 9"  (1.753 m), weight 73.4 kg, SpO2 100 %. General appearance: alert, cooperative and Oriented to self. Resp: clear to auscultation bilaterally Cardio: regular rate and rhythm, S1, S2 normal, no murmur, click, rub or gallop GI: soft, non-tender; bowel sounds normal; no masses,  no organomegaly Extremities: Arm edema bilaterally, no pain or tenderness.  Disposition: Discharge disposition: 03-Skilled Nursing Facility       Discharge Instructions    Diet - low sodium heart healthy   Complete by: As directed    Increase activity slowly   Complete by: As directed      Allergies as of 08/17/2020      Reactions   Clobetasol Other (See Comments)   Chest pain,  heartburn, increase blood sugar      Medication List    STOP taking these medications   hydrochlorothiazide 25 MG tablet Commonly known as: HYDRODIURIL   valproic acid 250 MG capsule Commonly known as: DEPAKENE     TAKE these medications   acetaminophen 325 MG tablet Commonly known as: TYLENOL Take 650 mg by mouth every 6 (six) hours as needed for pain or fever.   aspirin EC 81 MG tablet Take 81 mg by mouth daily.   atorvastatin 40 MG tablet Commonly known as: LIPITOR Take 40 mg by mouth at bedtime.   carbidopa-levodopa 25-100 MG tablet Commonly known as: SINEMET IR Take 1 tablet by mouth 3 (three) times daily. What changed: how much to take   cetirizine 10 MG tablet Commonly known as: ZYRTEC Take 10 mg by mouth 2 (two) times daily.   clopidogrel 75 MG tablet Commonly known as: PLAVIX Take 1 tablet (75 mg total) by mouth daily.   divalproex 500 MG DR tablet Commonly known as: DEPAKOTE Take 1 tablet (500 mg total) by mouth 2 (two) times daily. What changed: when to take this   losartan 50 MG tablet Commonly known as: COZAAR Take 1 tablet (50 mg total) by mouth daily. What changed:   medication strength  how much to take   metFORMIN 500 MG tablet Commonly known as: GLUCOPHAGE Take 500 mg by mouth 2 (two) times daily with a meal.   sertraline 25 MG tablet Commonly known as: ZOLOFT Take 25 mg by mouth daily.       Contact information for follow-up providers    [place] Follow up in 1 week(s).   Contact information: [address] [place] [address] [place] 10/17/2020 [place] [phone]  [facility] Follow up in 2 week(s).   Contact information: [address] [place] [address] [phone]           Contact information for after-discharge care    Destination    [facility] Preferred SNF .   Service: Skilled Nursing Contact information: [address] [place] [address] [phone]                 35 minutes Signed: Marrion Coy 08/17/2020, 9:16 AM

## 2020-09-01 ENCOUNTER — Non-Acute Institutional Stay: Payer: Medicare HMO | Admitting: Primary Care

## 2020-09-01 ENCOUNTER — Other Ambulatory Visit: Payer: Self-pay

## 2020-09-01 DIAGNOSIS — R569 Unspecified convulsions: Secondary | ICD-10-CM

## 2020-09-01 DIAGNOSIS — I951 Orthostatic hypotension: Secondary | ICD-10-CM

## 2020-09-01 DIAGNOSIS — I639 Cerebral infarction, unspecified: Secondary | ICD-10-CM

## 2020-09-01 NOTE — Progress Notes (Signed)
[facility] Palliative Care Consult Note Telephone: [phone]  Fax: [phone]    Date of encounter: 09/01/20 PATIENT NAME: Peter Moore [address] [place] [address]   [phone] (home)  DOB: 06-Jul-1940 MRN: [id number] PRIMARY CARE PROVIDER:    [facility],  [place] [address] [phone]  REFERRING PROVIDER:   [facility] [place],  [place] [address] [phone]  RESPONSIBLE PARTY:    Contact Information    Name Relation Home Work Mobile   Ackman,Linda Spouse   [phone]      I met face to face with patient and family (wife)  [facility]. Palliative Care was asked to follow this patient by consultation request of  [place] to address advance care planning and complex medical decision making. This is the initial visit.                                     ASSESSMENT AND PLAN / RECOMMENDATIONS:   Advance Care Planning/Goals of Care: Goals include to maximize quality of life and symptom management. Our advance care planning conversation included a discussion about:     The value and importance of advance care planning   Experiences with loved ones who have been seriously ill or have died   Exploration of personal, cultural or spiritual beliefs that might influence medical decisions   Exploration of goals of care in the event of a sudden injury or illness   Identification and preparation of a healthcare agent   Review and updating or creation of an  advance directive document .  Decision not to resuscitate or to de-escalate disease focused treatments due to poor prognosis.  CODE STATUS: DNR  Spoke with son also by phone. Discussed poor prognosis and decline in face of not having clearly treatable etiology ie UTI, pneumonia. Pt has been in decline for some months but this was exacerbated with recent stay in hospital and recent fall.  Discussed syndrome of decline and pt seems to be at this stage. Discussed hospice services and the appropriateness if that is with keeping with family goals . Discussed costs for care in various settings, son does not think it possible for his father to return home for care. Discussed hospice home and in place hospice. I have discussed also with SNF SW re days left for skilled care vs changing to hospice. Feeling of family was they would want it as soon as skilled days are over.  Symptom Management/Plan:   Debility: Pt was ambulating several months ago, now mostly obtunded, not eating or drinking more than a few bites and sips. 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  Pain: Grimaces, seems painful . Son Corene Cornea states he wants his dad comfortable as possible and if needed, medications for pain. Pt has order for comfort meds from PCP so he does not have to wait for hospice admission.  Orthostasis: Has been in bed past few days and likely cannot tolerate being oob.  Caregiver strain; Mrs Bigley at bedside outlines how she's declined and she wished he could tell her what he needs to say. She will need support of staff and hospice personnel as her husband declines. She requests transfer to hospice home if possible for EOL care.  Son would like his father moved the least, and would like  him to be able to stay in current room. They would benefit from hospice support in addition to SNF staff.   Follow up Palliative Care Visit: Palliative care will continue to follow for complex medical decision making, advance care planning, and clarification of goals. Return 1-2 weeks or prn.  I spent 45 minutes providing this consultation. More than 50% of the time in this consultation was spent in counseling and care coordination.  PPS: 20%  HOSPICE ELIGIBILITY/DIAGNOSIS: yes/ Parkinsons Disease,   Chief Complaint: frailty, decline  HISTORY OF PRESENT ILLNESS:  Peter Moore is a 80 y.o. year old male   with PD, falls, frailty and decline. Has had recent abx for increased white count. Discussed care goals/ advance planning with wife. She has some trouble with these decisions as thing have declined  quickly. Son Corene Cornea helps with medical decisions.  History obtained from review of EMR, discussion with primary team, and interview with family, facility staff/caregiver and/or Peter Moore.  I reviewed available labs, medications, imaging, studies and related documents from the EMR.  Records reviewed and summarized above.   ROS  General: NAD ENMT: endorses dysphagia Cardiovascular: denies chest pain, denies DOE Pulmonary: denies cough, denies increased SOB Abdomen: endorses no appetite, little intake ,denies constipation, endorses incontinence of bowel GU: denies dysuria, endorses incontinence of urine MSK:  endorses weakness,  no falls reported at SNF Skin: denies rashes or wounds Neurological: PAINAD pain scale, non verbal, , denies insomnia Psych: Endorses flat  mood Heme/lymph/immuno: denies bruises, abnormal bleeding  Physical Exam: Current and past weights: 161 lbs currently  12 % weight loss in 6 months, was 215  Lbs 3 years ago Constitutional: NAD General: frail appearing EYES: anicteric sclera, lids intact, no discharge  ENMT: intact hearing, oral mucous membranes moist CV: S1S2, RRR, no LE edema Pulmonary:rales throughout, no increased work of breathing, + cough, oxygen 2 L  Abdomen: intake <25%,  soft and non tender, no ascites GU: deferred MSK: moderate  sarcopenia, non ambulatory Skin: warm and dry, no rashes or wounds on visible skin Neuro: ++ generalized weakness,  Severe  cognitive impairment Psych: non-anxious affect, A and O x 1 Hem/lymph/immuno: no widespread bruising   CURRENT PROBLEM LIST:  Patient Active Problem List   Diagnosis Date Noted  . Orthostatic hypotension 08/10/2020  . Seizure ([place]) 03/01/2020  . Coronary artery disease   . Diabetes mellitus without  complication ([place])   . Hypertension   . CVA (cerebral vascular accident) ([place])   . Depression    PAST MEDICAL HISTORY:  Active Ambulatory Problems    Diagnosis Date Noted  . Seizure ([place]) 03/01/2020  . Coronary artery disease   . Diabetes mellitus without complication ([place])   . Hypertension   . CVA (cerebral vascular accident) ([place])   . Depression   . Orthostatic hypotension 08/10/2020   Resolved Ambulatory Problems    Diagnosis Date Noted  . No Resolved Ambulatory Problems   Past Medical History:  Diagnosis Date  . Myocardial infarction [facility])    SOCIAL HX:  Social History   Tobacco Use  . Smoking status: Current Every Day Smoker    Types: Cigarettes  . Smokeless tobacco: Never Used  Substance Use Topics  . Alcohol use: No   FAMILY HX: No family history on file. No further family history attainable from patient or on chart review.  ALLERGIES:  Allergies  Allergen Reactions  . Clobetasol Other (See Comments)    Chest pain, heartburn, increase blood sugar  PERTINENT MEDICATIONS:  Outpatient Encounter Medications as of 09/01/2020  Medication Sig  . acetaminophen (TYLENOL) 325 MG tablet Take 650 mg by mouth every 6 (six) hours as needed for pain or fever. (Patient not taking: Reported on 08/10/2020)  . aspirin EC 81 MG tablet Take 81 mg by mouth daily.  Marland Kitchen atorvastatin (LIPITOR) 40 MG tablet Take 40 mg by mouth at bedtime.  . carbidopa-levodopa (SINEMET IR) 25-100 MG tablet Take 1 tablet by mouth 3 (three) times daily.  . cetirizine (ZYRTEC) 10 MG tablet Take 10 mg by mouth 2 (two) times daily. (Patient not taking: Reported on 08/10/2020)  . clopidogrel (PLAVIX) 75 MG tablet Take 1 tablet (75 mg total) by mouth daily. (Patient not taking: No sig reported)  . divalproex (DEPAKOTE) 500 MG DR tablet Take 1 tablet (500 mg total) by mouth 2 (two) times daily.  Marland Kitchen losartan (COZAAR) 50 MG tablet Take 1 tablet (50 mg total) by mouth daily.  . metFORMIN (GLUCOPHAGE) 500 MG  tablet Take 500 mg by mouth 2 (two) times daily with a meal. (Patient not taking: Reported on 08/10/2020)  . sertraline (ZOLOFT) 25 MG tablet Take 25 mg by mouth daily. (Patient not taking: Reported on 08/10/2020)   No facility-administered encounter medications on file as of 09/01/2020.    Thank you for the opportunity to participate in the care of Mr. Duell.  The palliative care team will continue to follow. Please call our office at [phone] if we can be of additional assistance.   Jason Coop, NP , DNP, MPH, AGPCNP-BC, ACHPN  COVID-19 PATIENT SCREENING TOOL Asked and negative response unless otherwise noted:   Have you had symptoms of covid, tested positive or been in contact with someone with symptoms/positive test in the past 5-10 days?

## 2020-09-11 DEATH — deceased

## 2020-12-15 IMAGING — CT CT HEAD W/O CM
3 series · 15 of 47 positions shown, 18 images · non-contrast
Comparison: Head CT 01/23/2019.

CLINICAL DATA: Seizure, abnormal neuro exam.

EXAM:
CT HEAD WITHOUT CONTRAST
TECHNIQUE: Contiguous axial images were obtained from the base of the skull
through the vertex without intravenous contrast.

[Series 2: head wo · axial · 0.47mm/px · z∈[-143,-18]mm · 9 of 31 slices shown, 12 images]
[im 3/31  brain]
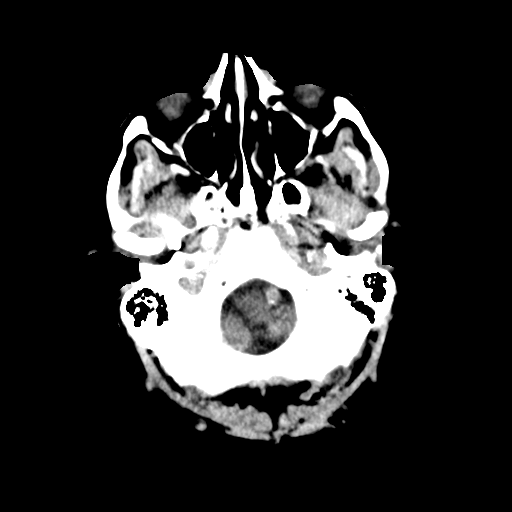
[im 3/31  bone]
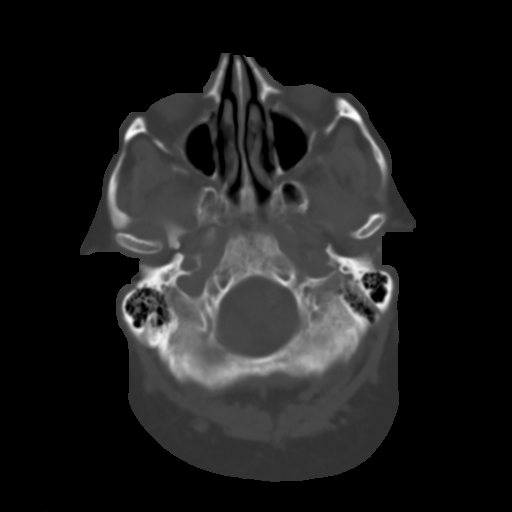
[im 6/31  brain]
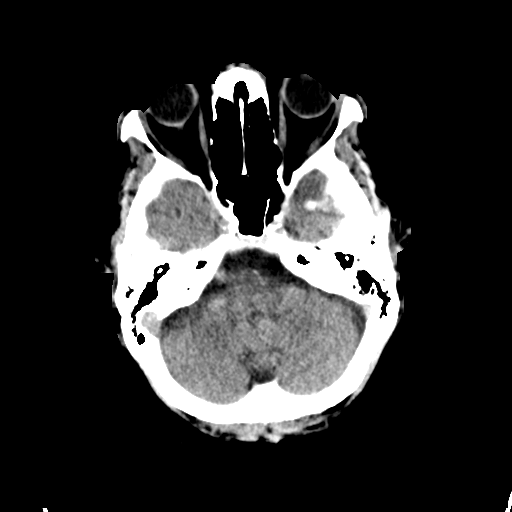
[im 9/31  brain]
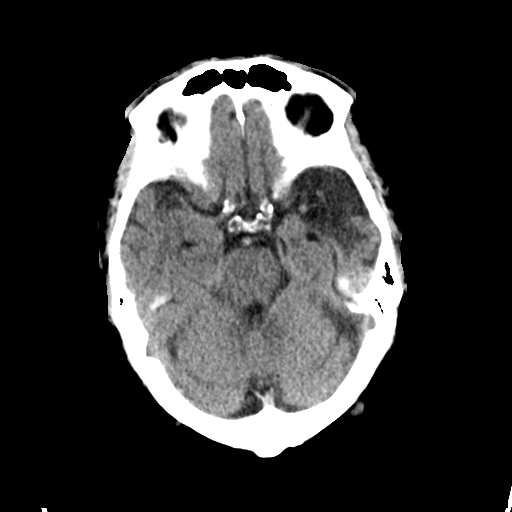
[im 12/31  brain]
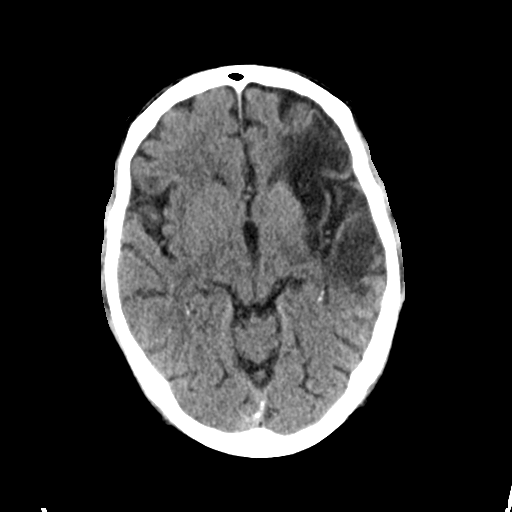
[im 16/31  brain]
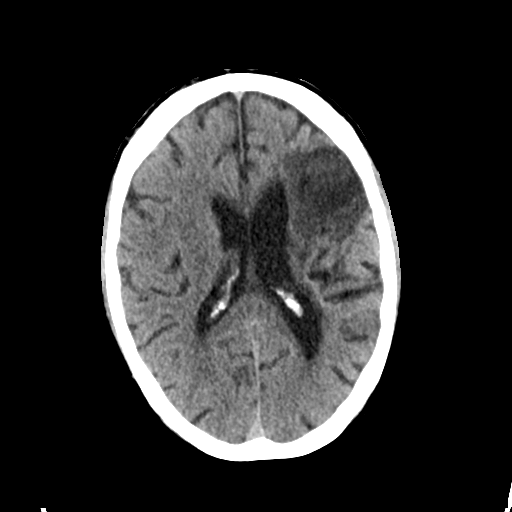
[im 16/31  bone]
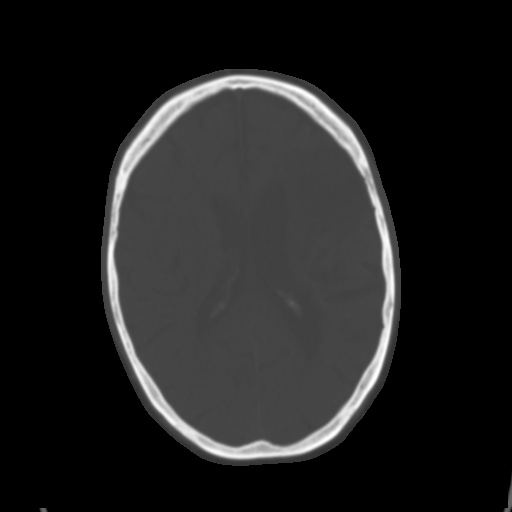
[im 19/31  brain]
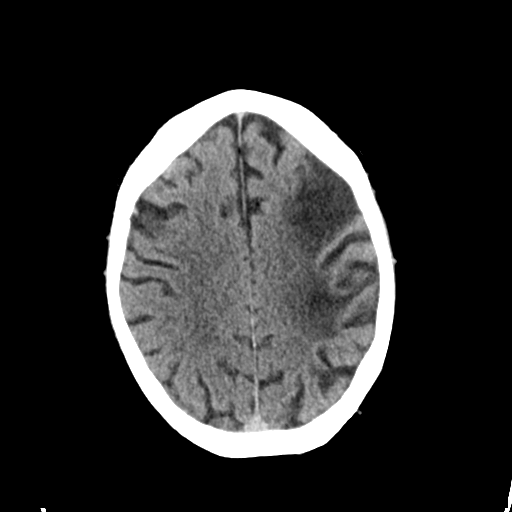
[im 22/31  brain]
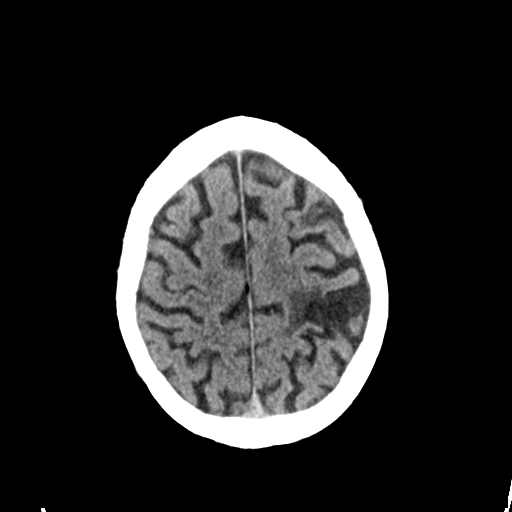
[im 25/31  brain]
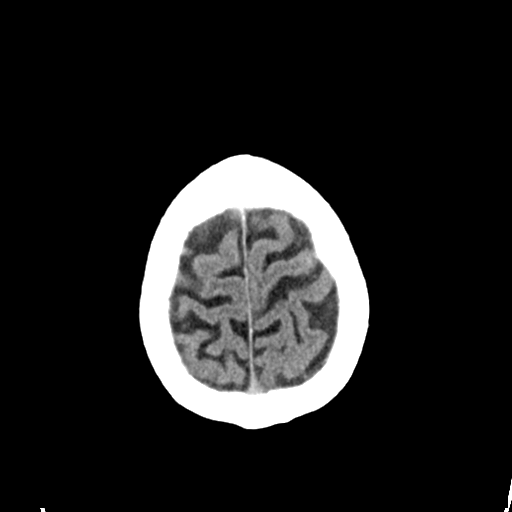
[im 28/31  brain]
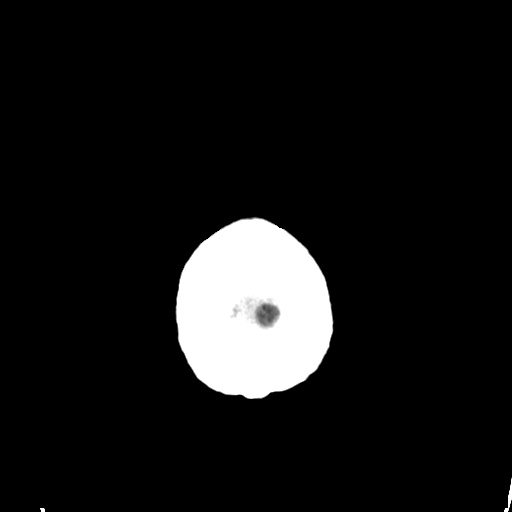
[im 28/31  bone]
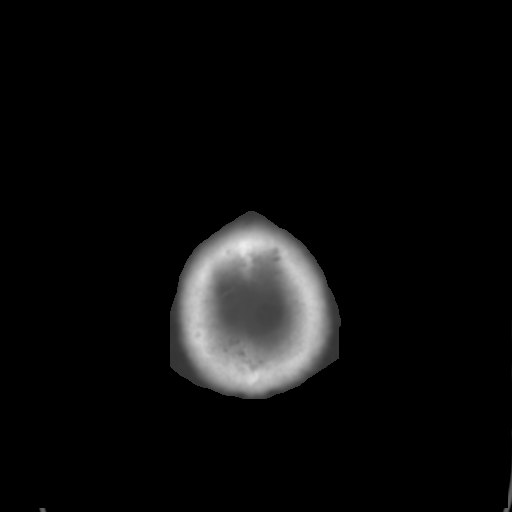

[Series 4: coronal soft tissue · coronal · 0.31mm/px · 3 of 65 slices shown]
[im 22/65  brain]
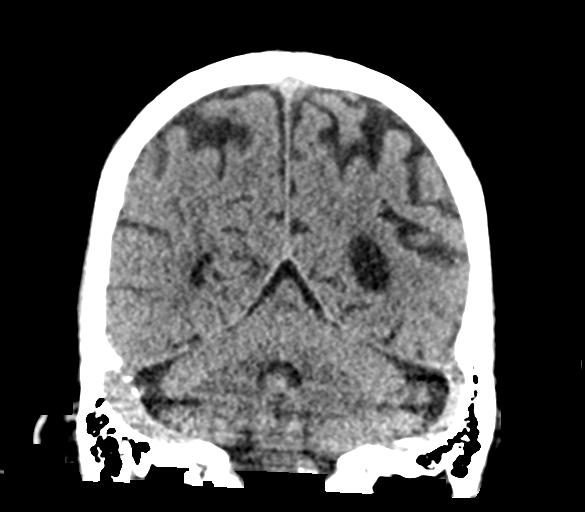
[im 29/65  brain]
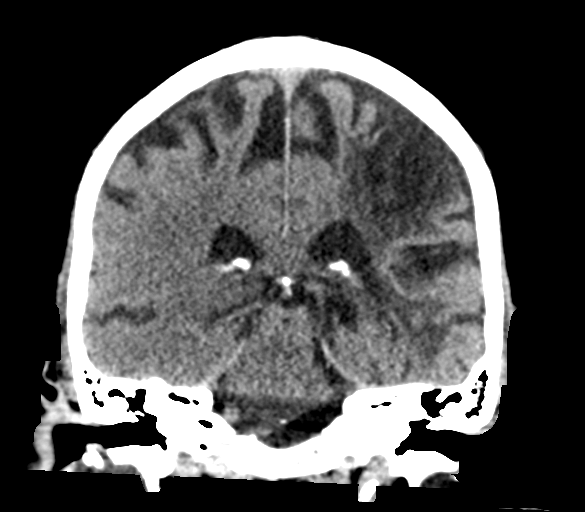
[im 36/65  brain]
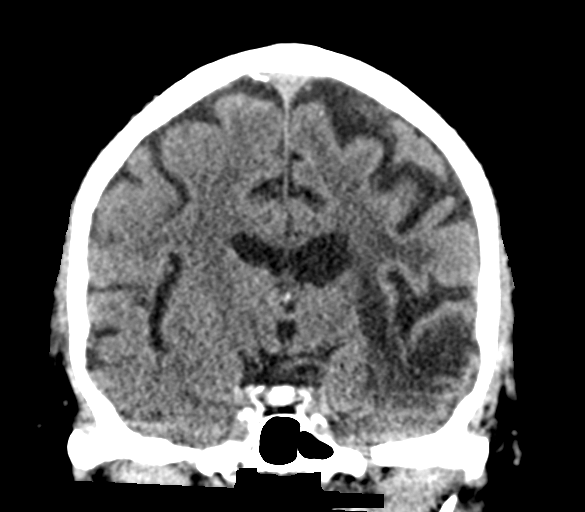

[Series 5: sagittal soft tissue · sagittal · 0.31mm/px · 3 of 52 slices shown]
[im 18/52  brain]
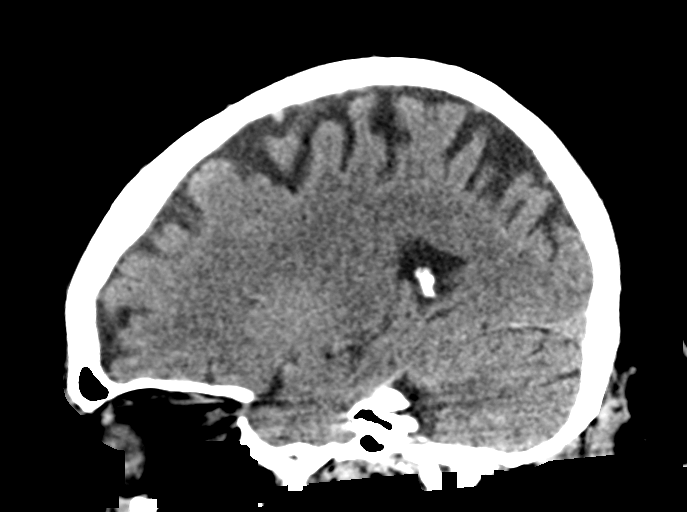
[im 26/52  brain]
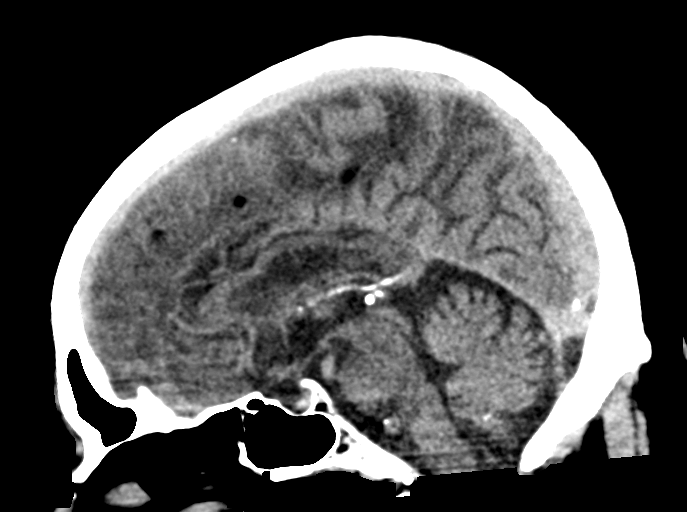
[im 35/52  brain]
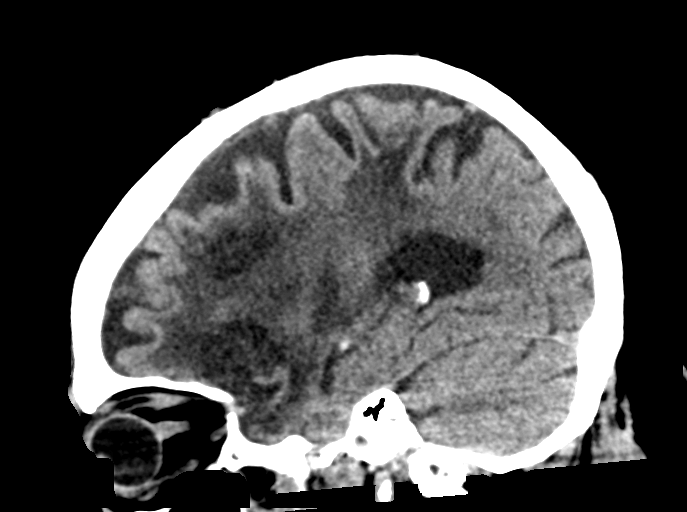

[15 of 47 positions shown; findings below may reference images not displayed]

FINDINGS: Brain:

Redemonstrated chronic left MCA territory cortically based infarcts
within the left frontal, parietal and temporal lobes as well as left
insula and subinsular region.

Background mild generalized cerebral atrophy.

There is no acute intracranial hemorrhage.

No demarcated cortical infarct.

No extra-axial fluid collection.

No evidence of intracranial mass.

No midline shift.

Vascular: No hyperdense vessel.  Atherosclerotic calcifications.

Skull: Normal. Negative for fracture or focal lesion.

Sinuses/Orbits: Visualized orbits show no acute finding. Trace
ethmoid sinus mucosal thickening. No significant mastoid effusion.
IMPRESSION: No evidence of acute intracranial abnormality.

Redemonstrated chronic left MCA territory cortically-based infarcts
within the left frontal, parietal and temporal lobes as well as left
insula/subinsular region.

Background mild generalized cerebral atrophy, stable as compared to
the head CT of 01/23/2019.

## 2020-12-15 IMAGING — MR MR HEAD W/O CM
10 of 12 series · 35 of 48 positions shown · non-contrast
Comparison: Correlation made with prior CT imaging

CLINICAL DATA: Possible seizure, history of stroke

EXAM:
MRI HEAD WITHOUT CONTRAST
TECHNIQUE: Multiplanar, multiecho pulse sequences of the brain and surrounding
structures were obtained without intravenous contrast.

[Series 5: ax dwi_tracew · axial · 3.0mm · 0.60mm/px · z∈[-101,+54]mm · 5 of 48 slices shown]
[im 1/48]
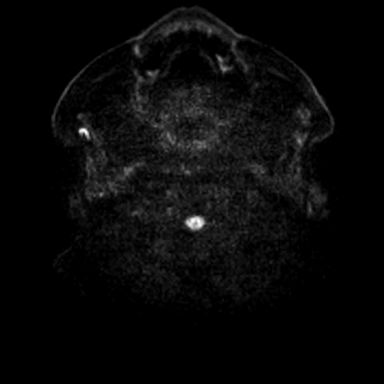
[im 12/48]
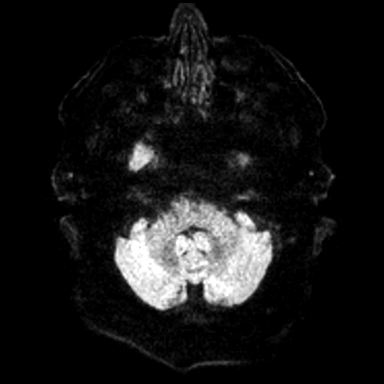
[im 24/48]
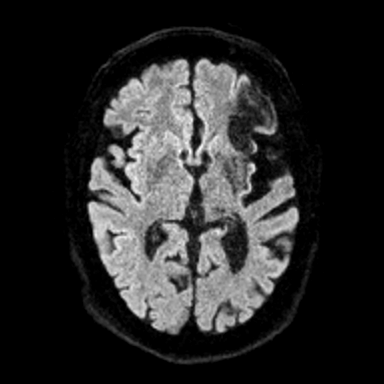
[im 36/48]
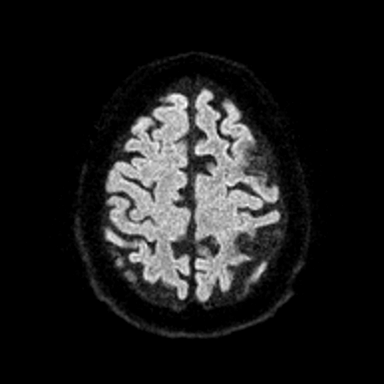
[im 48/48]
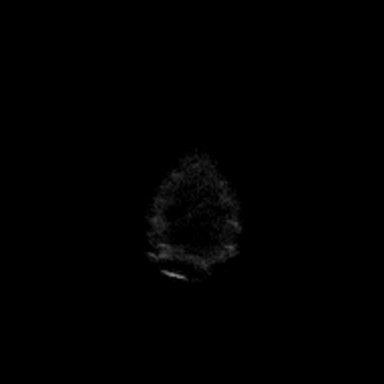

[Series 6: ax dwi_adc · axial · 3.0mm · 0.60mm/px · z∈[-101,+54]mm · 5 of 48 slices shown]
[im 1/48]
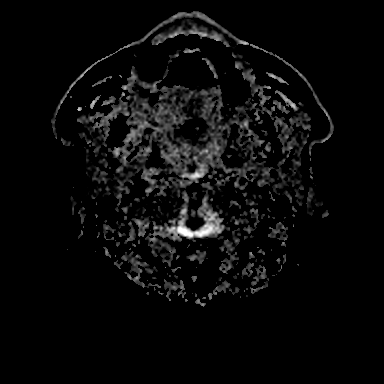
[im 12/48]
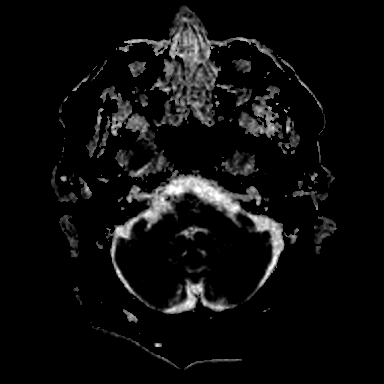
[im 24/48]
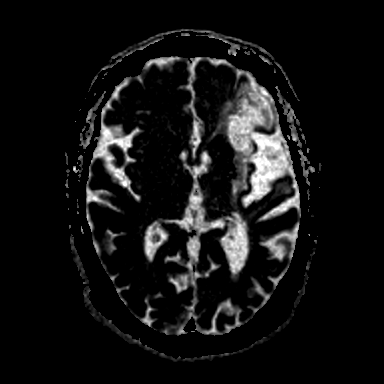
[im 36/48]
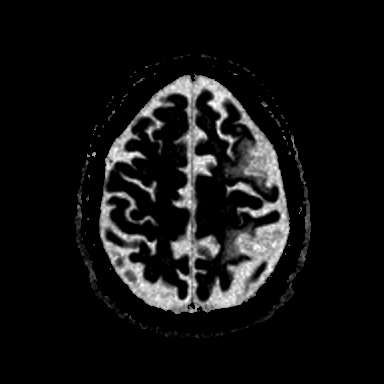
[im 48/48]
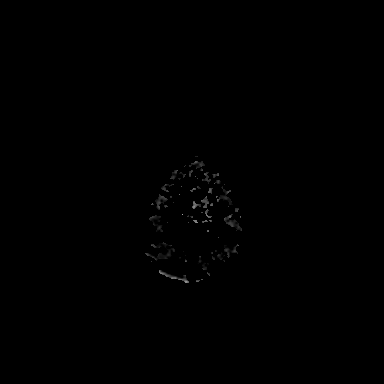

[Series 7: cor dwi_tracew · coronal · 5.0mm · 0.60mm/px · 4 of 40 slices shown]
[im 1/40]
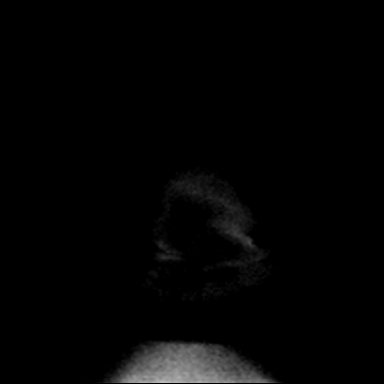
[im 14/40]
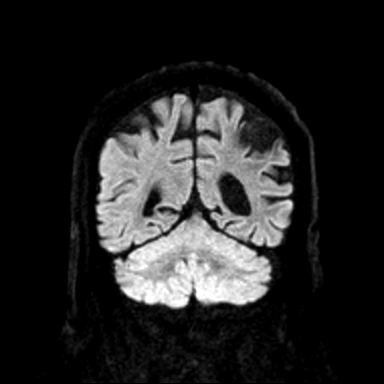
[im 27/40]
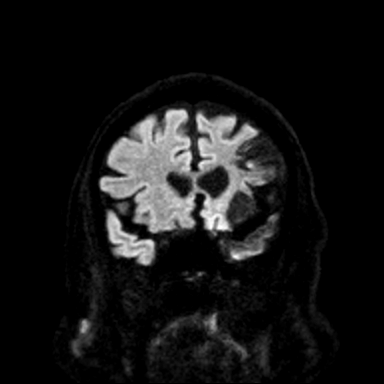
[im 40/40]
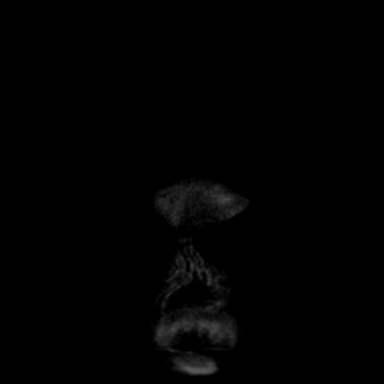

[Series 8: cor dwi_adc · coronal · 5.0mm · 0.60mm/px · 3 of 40 slices shown]
[im 1/40]
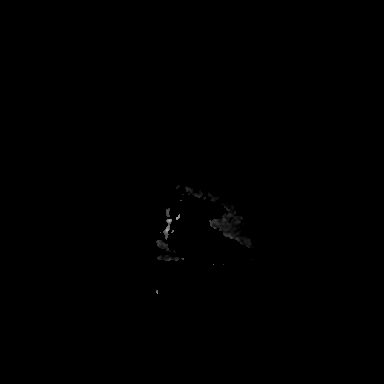
[im 14/40]
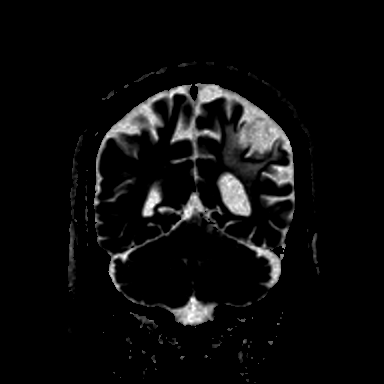
[im 27/40]
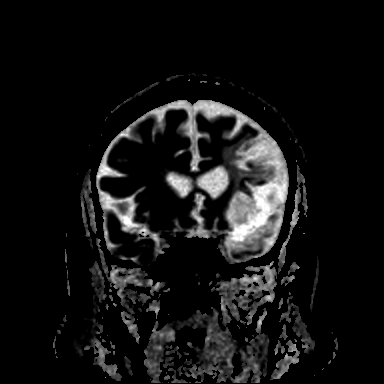

[Series 9: T1 · sagittal · 5.0mm · 0.62mm/px · 2 of 25 slices shown (1 of 2)]
[im 1/25]
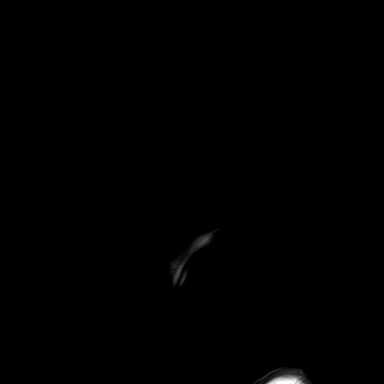
[im 25/25]
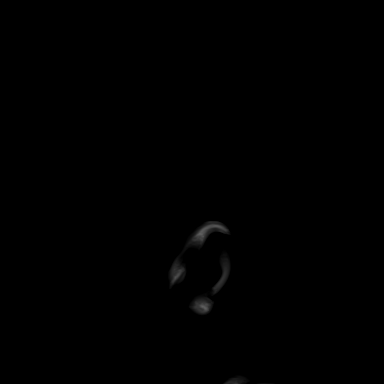

[Series 10: T2 · axial · 5.0mm · 0.53mm/px · z∈[-93,+51]mm · 2 of 25 slices shown (1 of 2)]
[im 1/25]
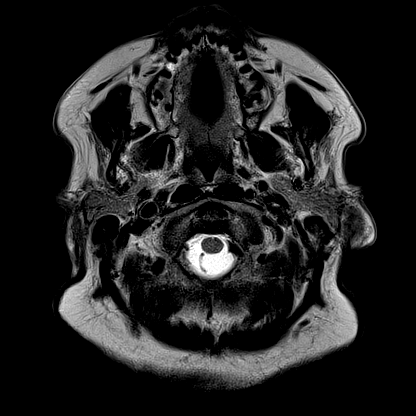
[im 25/25]
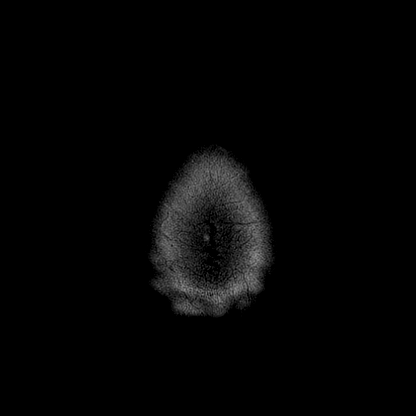

[Series 11: FLAIR · axial · 3.0mm · 0.53mm/px · z∈[-102,+60]mm · 5 of 55 slices shown (1 of 2)]
[im 1/55]
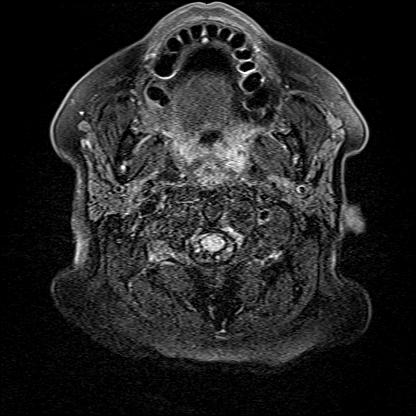
[im 14/55]
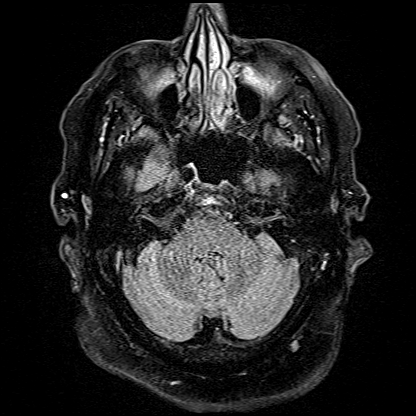
[im 28/55]
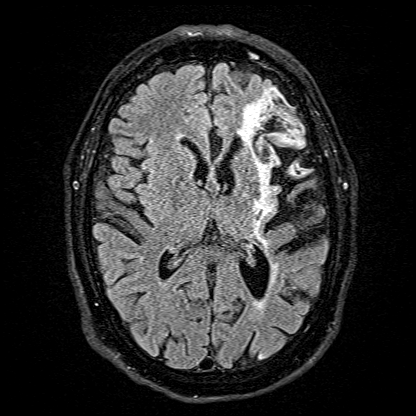
[im 41/55]
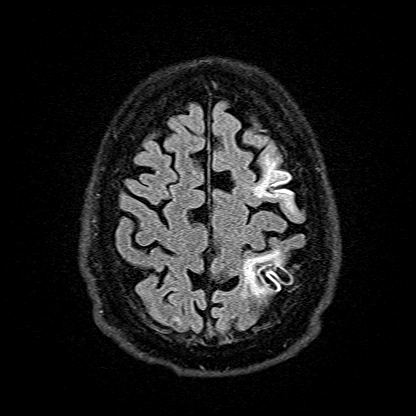
[im 55/55]
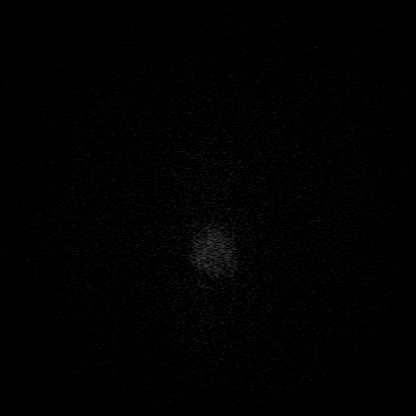

[Series 12: T2 · coronal · 3.0mm · 0.47mm/px · 3 of 35 slices shown (2 of 2)]
[im 1/35]
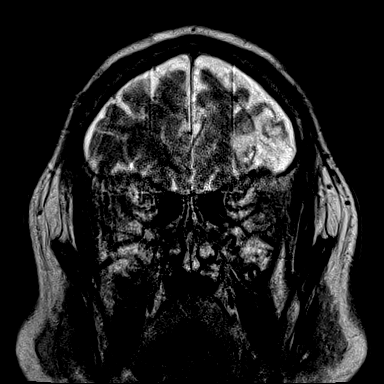
[im 18/35]
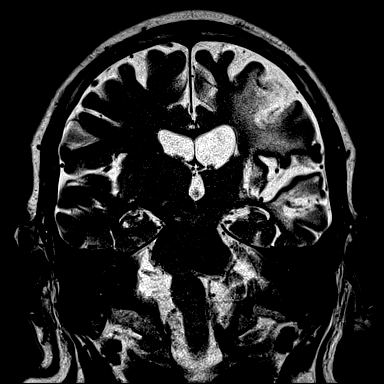
[im 35/35]
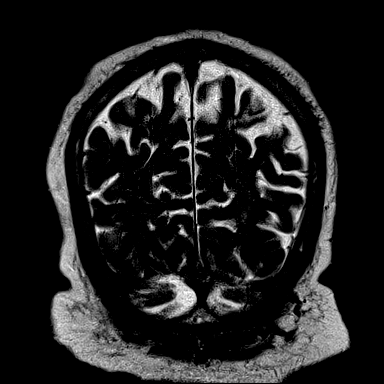

[Series 17: FLAIR · coronal · 3.0mm · 0.47mm/px · 3 of 35 slices shown (2 of 2)]
[im 1/35]
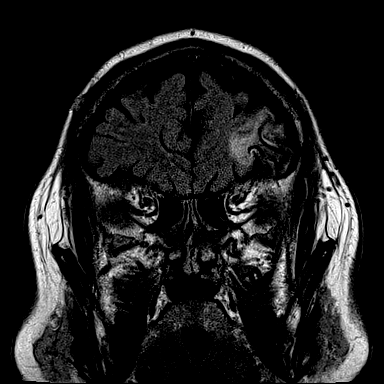
[im 18/35]
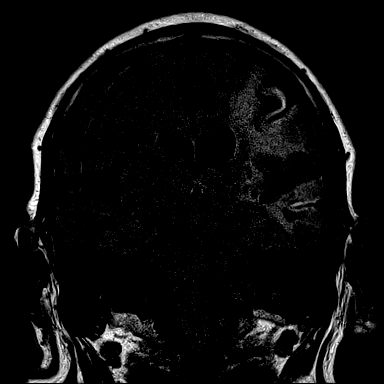
[im 35/35]
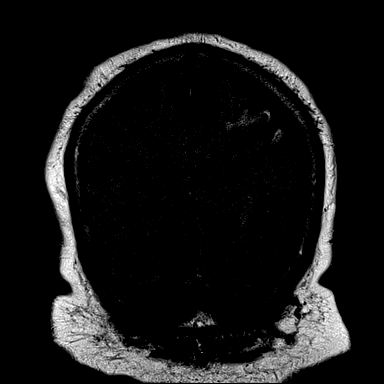

[Series 18: T1 · axial · 5.0mm · 0.90mm/px · z∈[-98,+57]mm · 3 of 27 slices shown (2 of 2)]
[im 1/27]
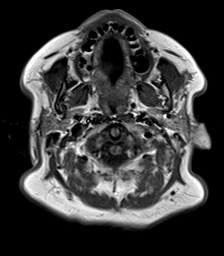
[im 14/27]
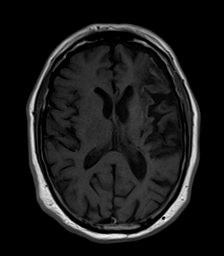
[im 27/27]
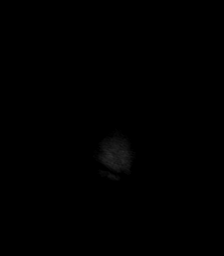

[35 of 48 positions shown; findings below may reference images not displayed]

FINDINGS: Brain: There is small area diffusion hyperintensity within the left
corona radiata. Chronic left MCA territory infarction in with
involvement frontal, parietal, and temporal lobes as well as the
insula. There is associated hemosiderin deposition. There are also
infarcts of the left basal ganglia and thalamus with chronic blood
products.

Ex vacuo dilatation of the left lateral ventricle secondary to
above. Prominence of the ventricles and sulci reflects generalized
parenchymal volume loss. Additional patchy T2 hyperintensity in the
supratentorial white matter is nonspecific but may reflect minor
chronic microvascular ischemic changes. There is no intracranial
mass or mass effect.

Vascular: Major vessel flow voids at the skull base are preserved.

Skull and upper cervical spine: Normal marrow signal is preserved.

Sinuses/Orbits: Trace mucosal thickening.  Orbits are unremarkable.

Other: Sella is unremarkable.  Mastoid air cells are clear.
IMPRESSION: Small subacute infarct of the left corona radiata.

Chronic infarcts as described.
